# Patient Record
Sex: Female | Born: 1970 | Race: Black or African American | Hispanic: No | Marital: Married | Smoking: Never smoker
Health system: Southern US, Community
[De-identification: ages and names within clinical notes are randomized; demographics above are authoritative.]

## PROBLEM LIST (undated history)

## (undated) DIAGNOSIS — M549 Dorsalgia, unspecified: Secondary | ICD-10-CM

## (undated) DIAGNOSIS — K589 Irritable bowel syndrome without diarrhea: Secondary | ICD-10-CM

## (undated) DIAGNOSIS — F32A Depression, unspecified: Secondary | ICD-10-CM

## (undated) DIAGNOSIS — N809 Endometriosis, unspecified: Secondary | ICD-10-CM

## (undated) DIAGNOSIS — R5383 Other fatigue: Secondary | ICD-10-CM

## (undated) DIAGNOSIS — I10 Essential (primary) hypertension: Secondary | ICD-10-CM

## (undated) DIAGNOSIS — R7303 Prediabetes: Secondary | ICD-10-CM

## (undated) DIAGNOSIS — M722 Plantar fascial fibromatosis: Secondary | ICD-10-CM

## (undated) DIAGNOSIS — N3010 Interstitial cystitis (chronic) without hematuria: Secondary | ICD-10-CM

## (undated) DIAGNOSIS — F418 Other specified anxiety disorders: Secondary | ICD-10-CM

## (undated) DIAGNOSIS — R7309 Other abnormal glucose: Secondary | ICD-10-CM

## (undated) DIAGNOSIS — G43909 Migraine, unspecified, not intractable, without status migrainosus: Secondary | ICD-10-CM

## (undated) DIAGNOSIS — F329 Major depressive disorder, single episode, unspecified: Secondary | ICD-10-CM

## (undated) DIAGNOSIS — T7840XA Allergy, unspecified, initial encounter: Secondary | ICD-10-CM

## (undated) HISTORY — PX: OTHER SURGICAL HISTORY: SHX169

## (undated) HISTORY — DX: Major depressive disorder, single episode, unspecified: F32.9

## (undated) HISTORY — PX: TUBAL LIGATION: SHX77

## (undated) HISTORY — DX: Migraine, unspecified, not intractable, without status migrainosus: G43.909

## (undated) HISTORY — DX: Other specified anxiety disorders: F41.8

## (undated) HISTORY — PX: GANGLION CYST EXCISION: SHX1691

## (undated) HISTORY — PX: ABDOMINAL HYSTERECTOMY: SHX81

## (undated) HISTORY — DX: Interstitial cystitis (chronic) without hematuria: N30.10

## (undated) HISTORY — DX: Prediabetes: R73.03

## (undated) HISTORY — DX: Other abnormal glucose: R73.09

## (undated) HISTORY — DX: Other fatigue: R53.83

## (undated) HISTORY — DX: Plantar fascial fibromatosis: M72.2

## (undated) HISTORY — DX: Depression, unspecified: F32.A

## (undated) HISTORY — DX: Allergy, unspecified, initial encounter: T78.40XA

## (undated) HISTORY — DX: Irritable bowel syndrome, unspecified: K58.9

## (undated) HISTORY — PX: FOOT SURGERY: SHX648

## (undated) HISTORY — DX: Endometriosis, unspecified: N80.9

---

## 2001-07-15 ENCOUNTER — Emergency Department (HOSPITAL_COMMUNITY): Admission: EM | Admit: 2001-07-15 | Discharge: 2001-07-15 | Payer: Self-pay | Admitting: Emergency Medicine

## 2001-07-15 ENCOUNTER — Encounter: Payer: Self-pay | Admitting: Emergency Medicine

## 2002-12-21 ENCOUNTER — Emergency Department (HOSPITAL_COMMUNITY): Admission: EM | Admit: 2002-12-21 | Discharge: 2002-12-21 | Payer: Self-pay | Admitting: Emergency Medicine

## 2004-10-17 ENCOUNTER — Ambulatory Visit: Payer: Self-pay

## 2004-11-24 ENCOUNTER — Ambulatory Visit: Payer: Self-pay | Admitting: Pain Medicine

## 2004-12-29 ENCOUNTER — Ambulatory Visit: Payer: Self-pay | Admitting: Pain Medicine

## 2007-04-04 ENCOUNTER — Ambulatory Visit: Payer: Self-pay | Admitting: Urology

## 2007-04-10 ENCOUNTER — Ambulatory Visit: Payer: Self-pay | Admitting: Urology

## 2008-06-06 ENCOUNTER — Other Ambulatory Visit: Payer: Self-pay

## 2008-06-06 ENCOUNTER — Emergency Department: Payer: Self-pay | Admitting: Emergency Medicine

## 2012-04-16 ENCOUNTER — Ambulatory Visit: Payer: Self-pay | Admitting: Obstetrics and Gynecology

## 2012-04-16 LAB — CBC
HCT: 38.2 % (ref 35.0–47.0)
HGB: 13.3 g/dL (ref 12.0–16.0)
MCH: 33.1 pg (ref 26.0–34.0)
Platelet: 263 10*3/uL (ref 150–440)
RBC: 4.03 10*6/uL (ref 3.80–5.20)
RDW: 13.1 % (ref 11.5–14.5)

## 2012-04-16 LAB — PREGNANCY, URINE: Pregnancy Test, Urine: NEGATIVE m[IU]/mL

## 2012-04-22 ENCOUNTER — Ambulatory Visit: Payer: Self-pay | Admitting: Obstetrics and Gynecology

## 2012-09-14 ENCOUNTER — Ambulatory Visit: Payer: Self-pay | Admitting: Physician Assistant

## 2012-12-08 ENCOUNTER — Emergency Department: Payer: Self-pay | Admitting: Emergency Medicine

## 2012-12-08 LAB — COMPREHENSIVE METABOLIC PANEL
Albumin: 3.7 g/dL (ref 3.4–5.0)
Anion Gap: 4 — ABNORMAL LOW (ref 7–16)
BUN: 11 mg/dL (ref 7–18)
Bilirubin,Total: 0.2 mg/dL (ref 0.2–1.0)
Chloride: 107 mmol/L (ref 98–107)
Co2: 27 mmol/L (ref 21–32)
EGFR (African American): >60
Osmolality: 275 (ref 275–301)
SGOT(AST): 33 U/L (ref 15–37)
SGPT (ALT): 24 U/L (ref 12–78)

## 2012-12-08 LAB — CBC
HCT: 39.4 % (ref 35.0–47.0)
HGB: 13.1 g/dL (ref 12.0–16.0)
MCHC: 33.3 g/dL (ref 32.0–36.0)
MCV: 95 fL (ref 80–100)
Platelet: 292 10*3/uL (ref 150–440)
RBC: 4.13 10*6/uL (ref 3.80–5.20)
RDW: 13 % (ref 11.5–14.5)

## 2012-12-09 ENCOUNTER — Telehealth: Payer: Self-pay | Admitting: *Deleted

## 2012-12-09 MED ORDER — PREDNISONE 10 MG PO TABS: ORAL_TABLET | ORAL | Status: DC

## 2012-12-09 MED ORDER — TOPIRAMATE 100 MG PO TABS: 100.0000 mg | ORAL_TABLET | Freq: Every day | ORAL | Status: DC

## 2012-12-09 NOTE — Telephone Encounter (Signed)
Pt called again and I spoke to her and relayed the orders below re: oral prednisone 6 day taper (common SE increased appetite, energy, and insomnia).  Also to increase topamax after done with prednisone. Meds [facility] to [facility] (this is current pharmacy).  Level 8, L side ear, neck then back of neck.  On and Off since last Thursday.  ER from 6pm yesterday to 0200 this am.  @ doses of toradol and some anti emetic.  (level 9 down to 2) form hospital visit.   Post hysterectomy , started with night sweats and ? Reason why migraines.

## 2012-12-09 NOTE — Telephone Encounter (Signed)
Please ask patient to take 6 day dose pack of Prednisone. Take as directed. [facility] [place] Please verify that this is correct pharmacy. After that increase Topamax to 100mg  total dose daily. This will be changed to the 100mg  tab.

## 2012-12-09 NOTE — Telephone Encounter (Signed)
Spoke to patient and the pharmacy is correct. She will go pick medicine up today.

## 2012-12-09 NOTE — Telephone Encounter (Signed)
The patient is calling due to her having a migraine since the 73 th of March. She had a migraine yesterday and ended up going to the hospital in [facility].

## 2012-12-12 ENCOUNTER — Other Ambulatory Visit: Payer: Self-pay | Admitting: *Deleted

## 2012-12-12 MED ORDER — VERAPAMIL HCL 80 MG PO TABS: 80.0000 mg | ORAL_TABLET | Freq: Two times a day (BID) | ORAL | Status: DC

## 2012-12-12 NOTE — Telephone Encounter (Signed)
Called patient to let her know that her prescription was called in.

## 2012-12-12 NOTE — Telephone Encounter (Deleted)
Patient calling wanting to know if she can get a refill for her Verapamil HCL 80 mg BID

## 2012-12-12 NOTE — Telephone Encounter (Signed)
Refill sent.

## 2012-12-12 NOTE — Telephone Encounter (Signed)
Patient calling wanting a refill on Verapmil HCL 80 mg.

## 2013-04-21 ENCOUNTER — Telehealth: Payer: Self-pay | Admitting: Nurse Practitioner

## 2013-04-21 MED ORDER — TOPIRAMATE 100 MG PO TABS: 100.0000 mg | ORAL_TABLET | Freq: Every day | ORAL | Status: DC

## 2013-04-21 NOTE — Telephone Encounter (Signed)
Rx sent 

## 2013-04-24 DIAGNOSIS — R102 Pelvic and perineal pain: Secondary | ICD-10-CM | POA: Insufficient documentation

## 2013-04-24 DIAGNOSIS — R339 Retention of urine, unspecified: Secondary | ICD-10-CM | POA: Insufficient documentation

## 2013-04-24 DIAGNOSIS — N3010 Interstitial cystitis (chronic) without hematuria: Secondary | ICD-10-CM | POA: Insufficient documentation

## 2013-04-24 DIAGNOSIS — R350 Frequency of micturition: Secondary | ICD-10-CM | POA: Insufficient documentation

## 2013-04-24 DIAGNOSIS — N949 Unspecified condition associated with female genital organs and menstrual cycle: Secondary | ICD-10-CM | POA: Insufficient documentation

## 2013-04-24 DIAGNOSIS — G8929 Other chronic pain: Secondary | ICD-10-CM

## 2013-05-24 ENCOUNTER — Other Ambulatory Visit: Payer: Self-pay | Admitting: Neurology

## 2013-06-13 ENCOUNTER — Encounter: Payer: Self-pay | Admitting: Nurse Practitioner

## 2013-06-16 ENCOUNTER — Ambulatory Visit: Payer: Self-pay | Admitting: Nurse Practitioner

## 2013-06-21 ENCOUNTER — Other Ambulatory Visit: Payer: Self-pay | Admitting: Neurology

## 2013-07-18 ENCOUNTER — Other Ambulatory Visit: Payer: Self-pay

## 2013-07-18 MED ORDER — VERAPAMIL HCL 80 MG PO TABS: 80.0000 mg | ORAL_TABLET | Freq: Two times a day (BID) | ORAL | Status: DC

## 2013-07-27 ENCOUNTER — Other Ambulatory Visit: Payer: Self-pay | Admitting: Neurology

## 2013-07-28 ENCOUNTER — Telehealth: Payer: Self-pay | Admitting: Nurse Practitioner

## 2013-07-28 NOTE — Telephone Encounter (Signed)
Patient has a scheduled yearly appt, requesting a refill on topiramate-100 mg

## 2013-09-13 ENCOUNTER — Other Ambulatory Visit: Payer: Self-pay | Admitting: Nurse Practitioner

## 2013-10-16 ENCOUNTER — Ambulatory Visit: Payer: Self-pay | Admitting: Nurse Practitioner

## 2013-10-17 ENCOUNTER — Encounter: Payer: Self-pay | Admitting: Nurse Practitioner

## 2013-10-17 ENCOUNTER — Ambulatory Visit (INDEPENDENT_AMBULATORY_CARE_PROVIDER_SITE_OTHER): Payer: BC Managed Care – PPO | Admitting: Nurse Practitioner

## 2013-10-17 ENCOUNTER — Encounter (INDEPENDENT_AMBULATORY_CARE_PROVIDER_SITE_OTHER): Payer: Self-pay

## 2013-10-17 VITALS — BP 118/73 | HR 87 | Ht 66.5 in | Wt 198.0 lb

## 2013-10-17 DIAGNOSIS — G43009 Migraine without aura, not intractable, without status migrainosus: Secondary | ICD-10-CM

## 2013-10-17 DIAGNOSIS — R51 Headache: Secondary | ICD-10-CM

## 2013-10-17 DIAGNOSIS — R519 Headache, unspecified: Secondary | ICD-10-CM | POA: Insufficient documentation

## 2013-10-17 MED ORDER — FROVATRIPTAN SUCCINATE 2.5 MG PO TABS: 2.5000 mg | ORAL_TABLET | ORAL | Status: DC | PRN

## 2013-10-17 MED ORDER — TOPIRAMATE 100 MG PO TABS: 100.0000 mg | ORAL_TABLET | Freq: Every day | ORAL | Status: DC

## 2013-10-17 MED ORDER — VERAPAMIL HCL 80 MG PO TABS: 80.0000 mg | ORAL_TABLET | Freq: Two times a day (BID) | ORAL | Status: DC

## 2013-10-17 NOTE — Patient Instructions (Signed)
Continue Topamax, Verapamil and Frova, will refill F/U yearly and prn

## 2013-10-17 NOTE — Progress Notes (Signed)
[facility]  PATIENT: Brittney Johns DOB: 02/25/1971   REASON FOR VISIT: follow up for migraine    HISTORY OF PRESENT ILLNESS: Brittney Johns, 43 year old female returns for followup. She was last seen in this office 2/11/ 2013. Her migraines are in good control with a combination of verapamil and Topamax. She takes Frova acutely. She needs refills. She returns for reevaluation   HISTORY: of migraines. She is a previous pt of Dr. Thad Rangereynolds. She is currently on verapamil and Topamax tolerating medications without side effects. She relates that she has had more tension headaches recently, her mother has dementia and recently diagnosed with poor kidney function and poor circulation. 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 She gets very little help from her siblings.  She says that she is aware that her headaches are worse due to her additional stress and lack of sleep.   REVIEW OF SYSTEMS: Full 14 system review of systems performed and notable only for those listed, all others are neg:  Constitutional: N/A  Cardiovascular: N/A  Ear/Nose/Throat: N/A  Skin: N/A  Eyes: N/A  Respiratory: N/A  Gastroitestinal: N/A  Hematology/Lymphatic: N/A  Endocrine: N/A Musculoskeletal:N/A  Allergy/Immunology: N/A  Neurological: depression Psychiatric: N/A   ALLERGIES: Allergies  Allergen Reactions  . Imitrex [Sumatriptan]     HOME MEDICATIONS: Outpatient Prescriptions Prior to Visit  Medication Sig Dispense Refill  . citalopram (CELEXA) 20 MG tablet Take 40 mg by mouth daily.       . frovatriptan (FROVA) 2.5 MG tablet Take 2.5 mg by mouth as needed for migraine. If recurs, may repeat after 2 hours. Max of 3 tabs in 24 hours.      . IMIPRAMINE HCL PO Take by mouth. Dose unknown      . topiramate (TOPAMAX) 100 MG tablet TAKE 1 TABLET BY MOUTH EVERY DAY  30 tablet  1  . verapamil (CALAN) 80 MG tablet TAKE 1 TABLET (80 MG TOTAL) BY MOUTH 2 (TWO)  TIMES DAILY.  60 tablet  1  . Diclofenac Potassium (CAMBIA) 50 MG PACK Take 50 mg by mouth as needed.      . predniSONE (DELTASONE) 10 MG tablet 6 day dose pack  Take as directed  21 tablet  0   No facility-administered medications prior to visit.    PAST MEDICAL HISTORY: Past Medical History  Diagnosis Date  . Migraine   . Depression with anxiety     PAST SURGICAL HISTORY: Past Surgical History  Procedure Laterality Date  . Facial surgey      open bite wound  . Ganglion cyst excision    Hysterectomy  FAMILY HISTORY: Family History  Problem Relation Age of Onset  . Aneurysm Father   . Dementia Mother   . Renal Disease      SOCIAL HISTORY: History   Social History  . Marital Status: Single    Spouse Name: N/A    Number of Children: 1  . Years of Education: N/A   Occupational History  . Not on file.   Social History Main Topics  . Smoking status: Never Smoker   . Smokeless tobacco: Never Used  . Alcohol Use: No  . Drug Use: No  . Sexual Activity: Not on file   Other Topics Concern  . Not on file   Social History Narrative   Patient lives with her mother and son.   Patient works for [employer].  PHYSICAL EXAM  Filed Vitals:   10/17/13 0844  Height: 5' 6.5" (1.689 m)  Weight: 198 lb (89.812 kg)   Body mass index is 31.48 kg/(m^2).  Generalized: Well developed, in no acute distress   Neurological examination   Mentation: Alert oriented to time, place, history taking. Follows all commands speech and language fluent  Cranial nerve II-XII: Pupils were equal round reactive to light extraocular movements were full, visual field were full on confrontational test. Facial sensation and strength were normal. hearing was intact to finger rubbing bilaterally. Uvula tongue midline. head turning and shoulder shrug were normal and symmetric.Tongue protrusion into cheek strength was normal. Motor: normal bulk and tone, full  strength in the BUE, BLE,  No focal weakness Coordination: finger-nose-finger, heel-to-shin bilaterally, no dysmetria Reflexes: 1+ upper lower and symmetric  Gait and Station: Rising up from seated position without assistance, normal stance,  moderate stride, good arm swing, smooth turning, able to perform tiptoe, and heel walking without difficulty. Tandem gait is steady  DIAGNOSTIC DATA (LABS, IMAGING, TESTING) -    ASSESSMENT AND PLAN  43 y.o. year old female  has a past medical history of Migraine and Depression with anxiety. here for followup for migraine. She is currently on Verapamil and Topamax and  Frova  works acutely  Continue Topamax, Verapamil and Frova, will refill F/U yearly and prn Nilda Riggs, [facility], [facility], APRN  [facility] [address], [address] [place], [place] [address] [phone]

## 2014-02-16 HISTORY — PX: BILATERAL OOPHORECTOMY: SHX1221

## 2014-03-11 ENCOUNTER — Ambulatory Visit: Payer: Self-pay | Admitting: Obstetrics and Gynecology

## 2014-03-11 LAB — CBC
HCT: 40.1 % (ref 35.0–47.0)
HGB: 13.3 g/dL (ref 12.0–16.0)
MCH: 31.7 pg (ref 26.0–34.0)
MCHC: 33.3 g/dL (ref 32.0–36.0)
MCV: 95 fL (ref 80–100)
PLATELETS: 321 10*3/uL (ref 150–440)
RBC: 4.21 10*6/uL (ref 3.80–5.20)
RDW: 13.1 % (ref 11.5–14.5)
WBC: 5 10*3/uL (ref 3.6–11.0)

## 2014-03-16 ENCOUNTER — Ambulatory Visit: Payer: Self-pay | Admitting: Obstetrics and Gynecology

## 2014-03-17 LAB — PATHOLOGY REPORT

## 2014-10-15 ENCOUNTER — Encounter: Payer: Self-pay | Admitting: Nurse Practitioner

## 2014-10-15 ENCOUNTER — Ambulatory Visit (INDEPENDENT_AMBULATORY_CARE_PROVIDER_SITE_OTHER): Payer: BC Managed Care – PPO | Admitting: Nurse Practitioner

## 2014-10-15 VITALS — BP 118/77 | HR 87 | Ht 66.5 in | Wt 198.0 lb

## 2014-10-15 DIAGNOSIS — G43009 Migraine without aura, not intractable, without status migrainosus: Secondary | ICD-10-CM

## 2014-10-15 MED ORDER — VERAPAMIL HCL 80 MG PO TABS: 80.0000 mg | ORAL_TABLET | Freq: Two times a day (BID) | ORAL | Status: DC

## 2014-10-15 MED ORDER — TOPIRAMATE 100 MG PO TABS: 100.0000 mg | ORAL_TABLET | Freq: Every day | ORAL | Status: DC

## 2014-10-15 NOTE — Progress Notes (Signed)
I have reviewed and agreed above plan. 

## 2014-10-15 NOTE — Progress Notes (Signed)
[facility]  PATIENT: Brittney Johns DOB: 10/14/1970   REASON FOR VISIT: follow up for migraine HISTORY FROM:patient    HISTORY OF PRESENT ILLNESS:Brittney Johns, 44 year old female returns for followup. She was last seen in this office 10/17/13.  Her migraines are in good control with a combination of verapamil and Topamax. Her insurance does not cover  Frova. She is allergic to Imitrex. She has had more tension headaches recently due to stress.  She needs refills. She returns for reevaluation   HISTORY: of migraines. She is a previous pt of Dr. Thad Ranger. She is currently on verapamil and Topamax tolerating medications without side effects. She relates that she has had more tension headaches recently, her mother has dementia and recently diagnosed with poor kidney function and poor circulation. 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 She gets very little help from her siblings. She says that she is aware that her headaches are worse due to her additional stress and lack of sleep.   REVIEW OF SYSTEMS: Full 14 system review of systems performed and notable only for those listed, all others are neg:  Constitutional: neg  Cardiovascular: neg Ear/Nose/Throat: neg  Skin: neg Eyes: neg Respiratory: neg Gastroitestinal: urinary frequency Hematology/Lymphatic: neg  Endocrine: neg Musculoskeletal:neg Allergy/Immunology: neg Neurological: headache Psychiatric: depression Sleep : neg   ALLERGIES: Allergies  Allergen Reactions  . Imitrex [Sumatriptan]     HOME MEDICATIONS: Outpatient Prescriptions Prior to Visit  Medication Sig Dispense Refill  . citalopram (CELEXA) 20 MG tablet Take 40 mg by mouth daily.     . cyclobenzaprine (FLEXERIL) 5 MG tablet 5 mg at bedtime. As needed    . estradiol (ESTRACE) 1 MG tablet 1 mg daily. 1 1/5 tab daily    . fluconazole (DIFLUCAN) 150 MG tablet     . fluticasone (FLONASE) 50 MCG/ACT nasal  spray     . frovatriptan (FROVA) 2.5 MG tablet Take 1 tablet (2.5 mg total) by mouth as needed for migraine. If recurs, may repeat after 2 hours. Max of 3 tabs in 24 hours. 10 tablet 6  . IMIPRAMINE HCL PO Take by mouth. Dose unknown    . topiramate (TOPAMAX) 100 MG tablet Take 1 tablet (100 mg total) by mouth daily. 90 tablet 3  . verapamil (CALAN) 80 MG tablet Take 1 tablet (80 mg total) by mouth 2 (two) times daily. 60 tablet 11  . metroNIDAZOLE (FLAGYL) 500 MG tablet      No facility-administered medications prior to visit.    PAST MEDICAL HISTORY: Past Medical History  Diagnosis Date  . Migraine   . Depression with anxiety TMJ       PAST SURGICAL HISTORY: Past Surgical History  Procedure Laterality Date  . Facial surgey      open bite wound  . Ganglion cyst excision      FAMILY HISTORY: Family History  Problem Relation Age of Onset  . Aneurysm Father   . Dementia Mother   . Renal Disease      SOCIAL HISTORY: History   Social History  . Marital Status: Single    Spouse Name: N/A    Number of Children: 1  . Years of Education: 12+   Occupational History  . Not on file.   Social History Main Topics  . Smoking status: Never Smoker   . Smokeless tobacco: Never Used  . Alcohol Use: No  . Drug Use: No  . Sexual Activity: Not on file  Other Topics Concern  . Not on file   Social History Narrative   Patient lives with her mother and son.   Patient works for [employer].           PHYSICAL EXAM  Filed Vitals:   10/15/14 1511  BP: 118/77  Pulse: 87  Height: 5' 6.5" (1.689 m)  Weight: 198 lb (89.812 kg)   Body mass index is 31.48 kg/(m^2). Generalized: Well developed, in no acute distress   Neurological examination   Mentation: Alert oriented to time, place, history taking. Follows all commands speech and language fluent  Cranial nerve II-XII: Pupils were equal round reactive to light extraocular movements were full,  visual field were full on confrontational test. Facial sensation and strength were normal. hearing was intact to finger rubbing bilaterally. Uvula tongue midline. head turning and shoulder shrug were normal and symmetric.Tongue protrusion into cheek strength was normal. Motor: normal bulk and tone, full strength in the BUE, BLE, No focal weakness Coordination: finger-nose-finger, heel-to-shin bilaterally, no dysmetria Reflexes: 1+ upper lower and symmetric  Gait and Station: Rising up from seated position without assistance, normal stance, moderate stride, good arm swing, smooth turning, able to perform tiptoe, and heel walking without difficulty. Tandem gait is steady   DIAGNOSTIC DATA (LABS, IMAGING, TESTING) -  ASSESSMENT AND PLAN  44 y.o. year old female  has a past medical history of Migraine and Depression with anxiety. here to follow up. Her migraines are in excellent control.  Patient will continue Topamax as ordered will refill Will continue Verapamil as ordered will refill F/U yearly and prn Nilda RiggsNancy Carolyn Deona Novitski, [facility]NP, [facility], APRN  [facility] [address], [address] [place], [place] [address] [phone]

## 2014-10-15 NOTE — Patient Instructions (Signed)
Patient will continue Topamax as ordered will refill Will continue Verapamil as ordered will refill F/U yearly and prn

## 2015-01-05 NOTE — H&P (Signed)
PATIENT NAME:  Brittney Johns, Brittney Johns MR#:  [id number] DATE OF BIRTH:  1971-03-02  DATE OF ADMISSION:  04/22/2012  PREOPERATIVE DIAGNOSIS: Symptomatic endometriosis.   HISTORY OF PRESENT ILLNESS: Brittney Johns is a 44 year old African American female, para 1-0-2-1, with a long history of symptomatic endometriosis manifested with severe dysmenorrhea and chronic perimenstrual low back pain, previously treated with danazol until side effects occurred, presents now for definitive surgery through transvaginal hysterectomy. The patient does also have some mild pelvic organ prolapse which will facilitate the procedure through the vagina. She had some mild incontinence symptoms. She does not have to wear pads.   PAST MEDICAL HISTORY:  1. Endometriosis.  2. Chronic pelvic pain.  3. Anxiety/depression.  4. Seasonal allergies.  5. Migraine headaches.  6. Unstable bladder.   PAST SURGICAL HISTORY:  1. Laparoscopy with adhesiolysis.  2. Facial reconstruction.  3. Dilation and curettage of the uterus times two.   PAST OB HISTORY: Para 1-0-2-1, spontaneous vaginal delivery times one, 7-pound 2-ounce baby boy; TAB times two through dilation and curettage.   FAMILY HISTORY: Negative for cancer of the colon, ovary, and breast. There is no history of diabetes mellitus or endometriosis. There is hypertension in mother and brother.   SOCIAL HISTORY: The patient does not smoke. She does not drink alcohol. She does not use drugs.       DRUG ALLERGIES: Imitrex.   MEDICATIONS: 1. Celexa 40 mg daily.  2. Provera 30 mg daily. 3. Topamax 50 mg. 4. Elmiron 100 mg. 5. Verapamil 40 mg.  REVIEW OF SYSTEMS: The patient denies recent illness. She denies history of coagulopathy. She denies significant reactive airway disease.   PHYSICAL EXAMINATION:  VITAL SIGNS: Height 67.6 inches, weight 186.4 pounds, body mass index 28.7, heart rate 86, blood pressure 114/78.   GENERAL: The patient is a pleasant,  well-appearing African American female in no acute distress.  She is alert and oriented.   OROPHARYNX: Clear.   NECK: Supple. There is no thyromegaly or adenopathy.   LUNGS: Clear.   HEART: Regular rate and rhythm without murmur.   ABDOMEN: Soft, nontender. No organomegaly. No pelvic masses.   PELVIC: External genitalia normal. BUS normal. Vagina has good estrogen effect. Cervix is parous without lesions. There is no significant cervical motion tenderness. Uterus is midplane and is of top normal size and is mobile and slightly tender. There is mild prolapse to the mid vagina. Adnexa are nontender and nonpalpable.   RECTAL: Exam is not done.   EXTREMITIES: Without clubbing, cyanosis, or edema.   SKIN: Without rash.   MUSCULOSKELETAL: Exam is normal.   IMPRESSION:  1. Symptomatic endometriosis with chronic pelvic pain, midline and low back pain.  2. Mild pelvic organ prolapse.   PLAN: Transvaginal hysterectomy. Date of surgery is 04/22/2012.   CONSENT NOTE: Patient is to undergo TVH on 04/22/2012. She is understanding of the planned procedures and is aware of and is accepting of all surgical risks, which include but are not limited to bleeding, infection, pelvic organ injury with need for repair, blood clot disorders, anesthesia risks, and death. The patient does understand and accept all risks. She is ready and willing to proceed with surgery as scheduled.     ____________________________ Prentice DockerMartin A. Nino Amano, MD mad:bjt D: 04/21/2012 15:59:24 ET T: 04/21/2012 16:42:05 ET JOB#: [id number]  cc: Daphine DeutscherMartin A. Wendy Mikles, MD, <Dictator> Prentice DockerMARTIN A Kiwana Deblasi MD ELECTRONICALLY SIGNED 04/22/2012 14:25

## 2015-01-05 NOTE — Op Note (Signed)
PATIENT NAME:  Brittney LessenFRENCH, [name] MR#:  [id number] DATE OF BIRTH:  Apr 21, 1971  DATE OF PROCEDURE:  04/22/2012  PREOPERATIVE DIAGNOSIS: Symptomatic endometriosis.   POSTOPERATIVE DIAGNOSIS: Symptomatic endometriosis.   OPERATIVE PROCEDURE: Transvaginal hysterectomy.   SURGEON: Prentice DockerMartin A. Iyonnah Ferrante, M.D.   FIRST ASSISTANT: None.   ANESTHESIA: General endotracheal.   INDICATIONS: The patient is a 44 year old African American female, para 1-0-2-1, with a long history of symptomatic endometriosis manifested through severe dysmenorrhea and chronic perimenstrual low backache, previously treated with danazol until recently which was stopped secondary to side effects, and now presents for definitive surgery.   Findings at surgery revealed a small uterus, grossly normal in appearance. The ovaries and tubes appeared normal as well.   DESCRIPTION OF PROCEDURE: The patient was brought to the Operating Room where she was placed in the supine position. General endotracheal anesthesia was induced without difficulty. She was placed in the dorsal lithotomy position using the candy-cane stirrups. A Betadine perineal and intravaginal prep and drape was performed in the standard fashion. A Foley catheter was placed into the bladder and was draining clear yellow urine. A weighted speculum was placed into the vagina and a double-tooth tenaculum was placed on the cervix. A posterior colpotomy was made with Mayo scissors. The uterosacral ligaments were clamped, cut, and stick tied using 0 Vicryl suture. These were tagged. The cervix was circumscribed with a scalpel and the bladder was dissected off the lower uterine segment through sharp and blunt dissection. Sequentially the cardinal broad ligament complexes were then clamped, cut, and stick tied using 0 Vicryl suture. This was carried out to the level of the utero-ovarian ligaments which were then crossclamped, cut, and doubly ligated. The uterus was removed from the  operative field. The uteroovarian ligaments had two ligatures. First was a free tie with the second being a stick tie. Evaluation of the adnexal structures revealed normal tubes and ovaries. Minimal oozing was identified along the cardinal broad ligament suture line and these were oversewn with 3-0 Vicryl suture, in a simple running manner. Good hemostasis was obtained. The peritoneum was then reapproximated using a pursestring stitch of 0 Vicryl. This was followed by closure of the vagina with simple interrupted sutures of 2-0 chromic. On completion of the procedure, all instrumentation was removed from the vagina. The patient was then awakened, mobilized, and taken to the recovery room in satisfactory condition. Estimated blood loss was 50 mL. IV fluids was 700 mL. Urine output was 150 mL. the patient did receive Ancef antibiotic prophylaxis prior to surgery.  ____________________________ Prentice DockerMartin A. Aryn Kops, MD mad:slb D: 04/22/2012 14:25:10 ET T: 04/22/2012 14:55:00 ET JOB#: [id number]  cc: Daphine DeutscherMartin A. Jaicey Sweaney, MD, <Dictator> Prentice DockerMARTIN A Fraida Veldman MD ELECTRONICALLY SIGNED 04/24/2012 9:28

## 2015-01-09 NOTE — Op Note (Signed)
PATIENT NAME:  Brittney Johns, Brittney Johns MR#:  [id number] DATE OF BIRTH:  1971/03/11  DATE OF PROCEDURE:  03/16/2014  PREOPERATIVE DIAGNOSIS: Symptomatic endometriosis.   POSTOPERATIVE DIAGNOSES:  1.  Symptomatic endometriosis.  2.  Pelvic adhesive disease.  3.  Patent ureters.  OPERATIVE PROCEDURES:  1.  Laparoscopic bilateral salpingo-oophorectomy. 2.  Adhesiolysis.  3.  Cystoscopy.  SURGEON: Prentice Docker. Calloway Andrus, M.D.   FIRST ASSISTANT: Dr. Valentino Saxon.   SECOND ASSISTANT: Jimmey Ralph, PA-S.   ANESTHESIA: General endotracheal.   INDICATIONS: The patient is a 44 year old African American female, status post transvaginal hysterectomy in the past, who has history of endometriosis, presents now for definitive surgery regarding chronic pelvic pain. The patient has significant pain requiring narcotic therapy and she now wishes to proceed with definitive surgery through laparoscopic BSO.  FINDINGS AT SURGERY: Revealed pelvic adhesions between the bowel and the right adnexa which were lysed. There were vaginal cuff adhesions which were lysed. The left tube and ovary appeared grossly normal. The right ovary was densely adherent to the pelvic sidewall overlying the ureter. There was diffuse yellow lesions of endometriosis noted over the anterior abdominal wall and bladder flap peritoneum. No powder burn implants were seen. The appendix appeared grossly normal. The upper abdomen, including liver and diaphragm, appeared normal.   DESCRIPTION OF PROCEDURE: The patient was brought to the operating room where she was placed in the supine position. General anesthesia was induced without difficulty. She was placed in the low lithotomy position using the bumblebee stirrups. A ChloraPrep and Betadine abdominal, perineal, and intravaginal prep and drape was performed in standard fashion. A red Robinson catheter was placed and was draining clear yellow urine from the bladder. A sponge stick was placed into the vagina. A  subumbilical vertical incision 5 mm in length was made. The Optiview laparoscopic trocar system was used to place the laparoscopic port through the subumbilical incision under direct visualization. No bowel or vascular injury was encountered. Two other ports were placed in the right and left lower quadrants, respectively. The right lower quadrant port was an 11 mm port. The left lower quadrant port was a 5 mm port. The above-noted findings were photo documented. The adhesiolysis was then performed using the Ace Harmonic scalpel and sharp and blunt dissection with alligator forceps. Kleppinger bipolar forceps were also used during the procedure to provide hemostasis at the infundibulopelvic ligament pedicles. The left tube and ovary was grasped and then the Harmonic scalpel was used to clamp, coagulate and desiccate the infundibulopelvic ligament, medial salpinx, and adhesions attaching it to the vaginal cuff. This uterus, tube, and ovary was placed in the cul-de-sac for further retrieval. Kleppinger bipolar forceps were used on the infundibulopelvic ligament pedicle to optimize hemostasis. Next, the adhesions noted in the cul-de-sac were taken down along with the bowel adhesions which were attached to the right adnexal region. Once anatomy was somewhat restored to normal, the right tube and ovary were excised using the Ace Harmonic scalpel. Care was taken to avoid the ureter during this dissection. Some 30% of residual ovary was left intact along with pelvic peritoneal sidewall, which was overlying the ureter. Once the tube and ovary were excised additional debulking of residual ovary was performed bluntly with grasping forceps along the pelvic sidewall. Some minimal Kleppinger bipolar forceps electrocautery was used to optimize hemostasis. Copious irrigation of the pelvis was performed. Bleeding was well controlled. Because the right ureter ran just underneath the right pelvic sidewall ovarian tissue, decision was  made to perform  cystoscopy to be sure that there was integrity of the ureters and functionality of the ureters. Prior to performing cystoscopy the Endo Catch bag was used to retrieve the bilateral tubes and ovaries. These were taken out through the right lower quadrant port site. Once the specimens were removed, hemostasis was verified, the laparoscopic procedure was terminated with the instruments being removed from the abdominopelvic cavity. The incisions were closed with 0 Vicryl on the fascia and 4-0 Vicryl on the skin. Dermabond glue was placed over the skin incisions. All instruments, needle, and sponge counts were verified as correct.   ESTIMATED BLOOD LOSS: Minimal. Urine output was 150 mL.   IV FLUIDS: Not quantified at the time of dictation.   ADDENDUM NOTE: Once the laparoscopic BSO and adhesiolysis was accomplished, cystoscopy was performed to be sure that there was functionality of the ureters. Then 5 mL of methylene blue was given intravenously and cystoscopy was then performed with the 30 degree scope. Visualization of the bladder was notable for normal appearing mucosa and patency and functionality of the ureteral orifices bilaterally. Once verification that there was appropriate functioning, cystoscopy was terminated with all instrumentation being removed from the bladder and vagina. The patient was then awakened, extubated and taken to the recovery room in satisfactory condition.   COMPLICATIONS: None.   COUNTS: All instruments, needle, and sponge counts were verified as correct.   ____________________________ Prentice DockerMartin A. Chun Sellen, MD mad:sb D: 03/18/2014 16:54:18 ET T: 03/19/2014 07:42:19 ET JOB#: [id number]  cc: Daphine DeutscherMartin A. Ashtynn Berke, MD, <Dictator> Prentice DockerMARTIN A Jeremie Abdelaziz MD ELECTRONICALLY SIGNED 03/21/2014 3:57

## 2015-02-10 ENCOUNTER — Emergency Department (HOSPITAL_COMMUNITY)
Admission: EM | Admit: 2015-02-10 | Discharge: 2015-02-10 | Disposition: A | Discharge status: Home / Self Care | Payer: BC Managed Care – PPO | Attending: Emergency Medicine | Admitting: Emergency Medicine

## 2015-02-10 ENCOUNTER — Encounter (HOSPITAL_COMMUNITY): Payer: Self-pay | Admitting: *Deleted

## 2015-02-10 DIAGNOSIS — M545 Low back pain, unspecified: Secondary | ICD-10-CM

## 2015-02-10 DIAGNOSIS — Z7982 Long term (current) use of aspirin: Secondary | ICD-10-CM | POA: Diagnosis not present

## 2015-02-10 DIAGNOSIS — F329 Major depressive disorder, single episode, unspecified: Secondary | ICD-10-CM | POA: Insufficient documentation

## 2015-02-10 DIAGNOSIS — Z79899 Other long term (current) drug therapy: Secondary | ICD-10-CM | POA: Insufficient documentation

## 2015-02-10 DIAGNOSIS — G43909 Migraine, unspecified, not intractable, without status migrainosus: Secondary | ICD-10-CM | POA: Insufficient documentation

## 2015-02-10 DIAGNOSIS — F419 Anxiety disorder, unspecified: Secondary | ICD-10-CM | POA: Diagnosis not present

## 2015-02-10 LAB — URINALYSIS, ROUTINE W REFLEX MICROSCOPIC
Bilirubin Urine: NEGATIVE
Glucose, UA: NEGATIVE mg/dL
HGB URINE DIPSTICK: NEGATIVE
KETONES UR: NEGATIVE mg/dL
LEUKOCYTES UA: NEGATIVE
Nitrite: NEGATIVE
Protein, ur: NEGATIVE mg/dL
Specific Gravity, Urine: 1.016 (ref 1.005–1.030)
Urobilinogen, UA: 1 mg/dL (ref 0.0–1.0)
pH: 6 (ref 5.0–8.0)

## 2015-02-10 MED ORDER — OXYCODONE-ACETAMINOPHEN 5-325 MG PO TABS: 1.0000 | ORAL_TABLET | Freq: Four times a day (QID) | ORAL | Status: DC | PRN

## 2015-02-10 MED ORDER — IBUPROFEN 600 MG PO TABS: 600.0000 mg | ORAL_TABLET | Freq: Four times a day (QID) | ORAL | Status: DC | PRN

## 2015-02-10 MED ORDER — OXYCODONE-ACETAMINOPHEN 5-325 MG PO TABS
1.0000 | ORAL_TABLET | Freq: Once | ORAL | Status: AC
  Administered 2015-02-10: 1 via ORAL
  Filled 2015-02-10: qty 1

## 2015-02-10 MED ORDER — KETOROLAC TROMETHAMINE 60 MG/2ML IM SOLN
60.0000 mg | Freq: Once | INTRAMUSCULAR | Status: AC
  Administered 2015-02-10: 60 mg via INTRAMUSCULAR

## 2015-02-10 NOTE — ED Provider Notes (Signed)
CSN: [id number]     Arrival date & time 02/10/15  0107 History  This chart was scribed for Shon Baton, MD by Doreatha Martin, ED Scribe. This patient was seen in room A06C/A06C and the patient's care was started at 1:34 AM.     Chief Complaint  Patient presents with  . Back Pain   The history is provided by the patient. No language interpreter was used.    HPI Comments: Brittney Johns is a 44 y.o. female with no chronic medical conditions who presents to the Emergency Department complaining of constant, left-sided 8/10 lower back pain onset 3 hours PTA.  She describes the pain with stiffness and tightness. Worse with movement.  She denies any falls, injuries, trauma, or heavy lifting prior to onset. She reports associated mild dysuria, vaginal itching, and abnormally dark urine from baseline. Pt states that she took Ibuprofen PTA with mild relief. She also states that she tried heat application with no relief, and this resulted in nausea. She takes Topamax, Celexa, Estradiol, and Verapamil at home. She denies a Hx of kidney stones. Pt also denies fevers or chills as associated Sx.   Past Medical History  Diagnosis Date  . Migraine   . Depression with anxiety    Past Surgical History  Procedure Laterality Date  . Facial surgey      open bite wound  . Ganglion cyst excision     Family History  Problem Relation Age of Onset  . Aneurysm Father   . Dementia Mother   . Renal Disease     History  Substance Use Topics  . Smoking status: Never Smoker   . Smokeless tobacco: Never Used  . Alcohol Use: No   OB History    No data available     Review of Systems  Constitutional: Negative for fever.  Genitourinary: Positive for dysuria. Negative for difficulty urinating.  Musculoskeletal: Positive for back pain.  Neurological: Negative for weakness and numbness.  All other systems reviewed and are negative.     Allergies  Imitrex  Home Medications   Prior to Admission  medications   Medication Sig Start Date End Date Taking? Authorizing Provider  aspirin 81 MG chewable tablet Chew 81 mg by mouth daily.   Yes Historical Provider, MD  citalopram (CELEXA) 20 MG tablet Take 20 mg by mouth daily.    Yes Historical Provider, MD  estradiol (ESTRACE) 1 MG tablet Take 1.5 mg by mouth daily.  10/15/13  Yes Historical Provider, MD  imipramine (TOFRANIL) 25 MG tablet Take 25 mg by mouth at bedtime.   Yes Historical Provider, MD  verapamil (CALAN) 80 MG tablet Take 1 tablet (80 mg total) by mouth 2 (two) times daily. 10/15/14  Yes Nilda Riggs, NP  ibuprofen (ADVIL,MOTRIN) 600 MG tablet Take 1 tablet (600 mg total) by mouth every 6 (six) hours as needed. 02/10/15   Shon Baton, MD  oxyCODONE-acetaminophen (PERCOCET/ROXICET) 5-325 MG per tablet Take 1 tablet by mouth every 6 (six) hours as needed for severe pain. 02/10/15   Shon Baton, MD  topiramate (TOPAMAX) 100 MG tablet Take 1 tablet (100 mg total) by mouth daily. Patient not taking: Reported on 02/10/2015 10/15/14   Nilda Riggs, NP   BP 120/75 mmHg  Pulse 92  Temp(Src) 97.9 F (36.6 C) (Oral)  Resp 16  SpO2 96% Physical Exam  Constitutional: She is oriented to person, place, and time. She appears well-developed and well-nourished.  HENT:  Head:  Normocephalic and atraumatic.  Cardiovascular: Normal rate, regular rhythm and normal heart sounds.   Pulmonary/Chest: Effort normal and breath sounds normal. No respiratory distress. She has no wheezes.  Abdominal: Soft. There is no tenderness.  Musculoskeletal:  Tenderness to palpation over the left lumbar paraspinous muscle region  Neurological: She is alert and oriented to person, place, and time.  Skin: Skin is warm and dry.  Psychiatric: She has a normal mood and affect.  Nursing note and vitals reviewed.   ED Course  Procedures (including critical care time) DIAGNOSTIC STUDIES: Oxygen Saturation is 97% on RA, normal by my  interpretation.    COORDINATION OF CARE: 1:37 AM Discussed treatment plan with pt at bedside and pt agreed to plan.  Labs Review Labs Reviewed  URINALYSIS, ROUTINE W REFLEX MICROSCOPIC - Abnormal; Notable for the following:    APPearance HAZY (*)    All other components within normal limits    Imaging Review No results found.   EKG Interpretation None      MDM   Final diagnoses:  Left-sided low back pain without sciatica    Patient presents with left lower back pain. Nontoxic on exam. No signs or symptoms of cauda equina. Patient does have urinary symptoms. Considerations include urinary tract infection versus musculoskeletal sprain. Patient given Toradol and Percocet. Urinalysis without evidence of obvious infection. No hematuria suggestive of kidney stones. Patient reports improvement with treatment. Given that pain worsens with movement and patient is tender on exam, this most likely represents a musculoskeletal strain. Patient will be given a short course of pain medication. She is to take ibuprofen every 6 hours as well.  After history, exam, and medical workup I feel the patient has been appropriately medically screened and is safe for discharge home. Pertinent diagnoses were discussed with the patient. Patient was given return precautions.  I personally performed the services described in this documentation, which was scribed in my presence. The recorded information has been reviewed and is accurate.   Shon Batonourtney F Horton, MD 02/10/15 [phone]

## 2015-02-10 NOTE — ED Notes (Signed)
Pt in c/o lower back pain, body aches and nausea, history of back pain in the past but states this feels different

## 2015-02-10 NOTE — Discharge Instructions (Signed)
Back Pain, Adult Low back pain is very common. About 1 in 5 people have back pain.The cause of low back pain is rarely dangerous. The pain often gets better over time.About half of people with a sudden onset of back pain feel better in just 2 weeks. About 8 in 10 people feel better by 6 weeks.  CAUSES Some common causes of back pain include:  Strain of the muscles or ligaments supporting the spine.  Wear and tear (degeneration) of the spinal discs.  Arthritis.  Direct injury to the back. DIAGNOSIS Most of the time, the direct cause of low back pain is not known.However, back pain can be treated effectively even when the exact cause of the pain is unknown.Answering your caregiver's questions about your overall health and symptoms is one of the most accurate ways to make sure the cause of your pain is not dangerous. If your caregiver needs more information, he or she may order lab work or imaging tests (X-rays or MRIs).However, even if imaging tests show changes in your back, this usually does not require surgery. HOME CARE INSTRUCTIONS For many people, back pain returns.Since low back pain is rarely dangerous, it is often a condition that people can learn to manageon their own.   Remain active. It is stressful on the back to sit or stand in one place. Do not sit, drive, or stand in one place for more than 30 minutes at a time. Take short walks on level surfaces as soon as pain allows.Try to increase the length of time you walk each day.  Do not stay in bed.Resting more than 1 or 2 days can delay your recovery.  Do not avoid exercise or work.Your body is made to move.It is not dangerous to be active, even though your back may hurt.Your back will likely heal faster if you return to being active before your pain is gone.  Pay attention to your body when you bend and lift. Many people have less discomfortwhen lifting if they bend their knees, keep the load close to their bodies,and  avoid twisting. Often, the most comfortable positions are those that put less stress on your recovering back.  Find a comfortable position to sleep. Use a firm mattress and lie on your side with your knees slightly bent. If you lie on your back, put a pillow under your knees.  Only take over-the-counter or prescription medicines as directed by your caregiver. Over-the-counter medicines to reduce pain and inflammation are often the most helpful.Your caregiver may prescribe muscle relaxant drugs.These medicines help dull your pain so you can more quickly return to your normal activities and healthy exercise.  Put ice on the injured area.  Put ice in a plastic bag.  Place a towel between your skin and the bag.  Leave the ice on for 15-20 minutes, 03-04 times a day for the first 2 to 3 days. After that, ice and heat may be alternated to reduce pain and spasms.  Ask your caregiver about trying back exercises and gentle massage. This may be of some benefit.  Avoid feeling anxious or stressed.Stress increases muscle tension and can worsen back pain.It is important to recognize when you are anxious or stressed and learn ways to manage it.Exercise is a great option. SEEK MEDICAL CARE IF:  You have pain that is not relieved with rest or medicine.  You have pain that does not improve in 1 week.  You have new symptoms.  You are generally not feeling well. SEEK   IMMEDIATE MEDICAL CARE IF:   You have pain that radiates from your back into your legs.  You develop new bowel or bladder control problems.  You have unusual weakness or numbness in your arms or legs.  You develop nausea or vomiting.  You develop abdominal pain.  You feel faint. Document Released: 09/04/2005 Document Revised: 03/05/2012 Document Reviewed: 01/06/2014 Exit[employer] Patient Information 2015 [employer]. This information is not intended to replace advice given to you by your health care provider. Make sure you  discuss any questions you have with your health care provider.  

## 2015-02-20 ENCOUNTER — Emergency Department
Admission: EM | Admit: 2015-02-20 | Discharge: 2015-02-21 | Disposition: A | Discharge status: Home / Self Care | Payer: BC Managed Care – PPO | Attending: Emergency Medicine | Admitting: Emergency Medicine

## 2015-02-20 ENCOUNTER — Encounter: Payer: Self-pay | Admitting: *Deleted

## 2015-02-20 ENCOUNTER — Emergency Department: Payer: BC Managed Care – PPO

## 2015-02-20 DIAGNOSIS — Z7982 Long term (current) use of aspirin: Secondary | ICD-10-CM | POA: Diagnosis not present

## 2015-02-20 DIAGNOSIS — W010XXA Fall on same level from slipping, tripping and stumbling without subsequent striking against object, initial encounter: Secondary | ICD-10-CM | POA: Diagnosis not present

## 2015-02-20 DIAGNOSIS — Y9289 Other specified places as the place of occurrence of the external cause: Secondary | ICD-10-CM | POA: Diagnosis not present

## 2015-02-20 DIAGNOSIS — Z79899 Other long term (current) drug therapy: Secondary | ICD-10-CM | POA: Diagnosis not present

## 2015-02-20 DIAGNOSIS — Y9389 Activity, other specified: Secondary | ICD-10-CM | POA: Diagnosis not present

## 2015-02-20 DIAGNOSIS — Z793 Long term (current) use of hormonal contraceptives: Secondary | ICD-10-CM | POA: Diagnosis not present

## 2015-02-20 DIAGNOSIS — S40011A Contusion of right shoulder, initial encounter: Secondary | ICD-10-CM | POA: Insufficient documentation

## 2015-02-20 DIAGNOSIS — S8991XA Unspecified injury of right lower leg, initial encounter: Secondary | ICD-10-CM | POA: Insufficient documentation

## 2015-02-20 DIAGNOSIS — Y998 Other external cause status: Secondary | ICD-10-CM | POA: Insufficient documentation

## 2015-02-20 DIAGNOSIS — S4991XA Unspecified injury of right shoulder and upper arm, initial encounter: Secondary | ICD-10-CM | POA: Diagnosis present

## 2015-02-20 MED ORDER — HYDROMORPHONE HCL 1 MG/ML IJ SOLN
1.0000 mg | Freq: Once | INTRAMUSCULAR | Status: AC
  Administered 2015-02-21: 1 mg via INTRAMUSCULAR

## 2015-02-20 MED ORDER — KETOROLAC TROMETHAMINE 60 MG/2ML IM SOLN
60.0000 mg | Freq: Once | INTRAMUSCULAR | Status: AC
  Administered 2015-02-20: 60 mg via INTRAMUSCULAR

## 2015-02-20 MED ORDER — KETOROLAC TROMETHAMINE 60 MG/2ML IM SOLN
INTRAMUSCULAR | Status: AC
  Administered 2015-02-20: 60 mg via INTRAMUSCULAR
  Filled 2015-02-20: qty 2

## 2015-02-20 MED ORDER — HYDROMORPHONE HCL 1 MG/ML IJ SOLN
INTRAMUSCULAR | Status: AC
  Administered 2015-02-21: 1 mg via INTRAMUSCULAR
  Filled 2015-02-20: qty 1

## 2015-02-20 NOTE — ED Notes (Signed)
Pt tripped over open dishwasher door, fell onto R side and c/o injury to R upper arm. Pt also c/o lesser pain and injury to R knee and R shin but is ambulatory and steady to triage.

## 2015-02-20 NOTE — ED Provider Notes (Signed)
[facility] Emergency Department Provider Note ____________________________________________  Time seen: Approximately 11:31 PM  I have reviewed the triage vital signs and the nursing notes.   HISTORY  Chief Complaint Arm Injury    HPI Brittney Johns is a 44 y.o. female plana right upper extremity pain secondary to a trip and fall. He states she's gentle open dishwasher door fall on her right side. Patient stated there is bruising and pain to the right upper extremity and some pain to the right knee. Patient stated there is decreased range of motion of the right upper extremity secondary to complain of pain. Patient rates the pain as a 6/10. Has any loss of sensation. Patient states she took 800 mg of ibuprofen approximately 5 hours ago.   Past Medical History  Diagnosis Date  . Migraine   . Depression with anxiety     Patient Active Problem List   Diagnosis Date Noted  . Migraine without aura 10/17/2013  . Headache(784.0) 10/17/2013  . FOM (frequency of micturition) 04/24/2013  . Female genital symptoms 04/24/2013  . Incomplete bladder emptying 04/24/2013  . Chronic interstitial cystitis 04/24/2013    Past Surgical History  Procedure Laterality Date  . Facial surgey      open bite wound  . Ganglion cyst excision      Current Outpatient Rx  Name  Route  Sig  Dispense  Refill  . aspirin 81 MG chewable tablet   Oral   Chew 81 mg by mouth daily.         . citalopram (CELEXA) 20 MG tablet   Oral   Take 20 mg by mouth daily.          Marland Kitchen estradiol (ESTRACE) 1 MG tablet   Oral   Take 1.5 mg by mouth daily.          Marland Kitchen ibuprofen (ADVIL,MOTRIN) 600 MG tablet   Oral   Take 1 tablet (600 mg total) by mouth every 6 (six) hours as needed.   30 tablet   0   . imipramine (TOFRANIL) 25 MG tablet   Oral   Take 25 mg by mouth at bedtime.         Marland Kitchen oxyCODONE-acetaminophen (PERCOCET/ROXICET) 5-325 MG per tablet   Oral   Take 1 tablet by  mouth every 6 (six) hours as needed for severe pain.   10 tablet   0   . topiramate (TOPAMAX) 100 MG tablet   Oral   Take 1 tablet (100 mg total) by mouth daily. Patient not taking: Reported on 02/10/2015   90 tablet   3   . verapamil (CALAN) 80 MG tablet   Oral   Take 1 tablet (80 mg total) by mouth 2 (two) times daily.   60 tablet   11     Allergies Imitrex  Family History  Problem Relation Age of Onset  . Aneurysm Father   . Dementia Mother   . Renal Disease      Social History History  Substance Use Topics  . Smoking status: Never Smoker   . Smokeless tobacco: Never Used  . Alcohol Use: No    Review of Systems Constitutional: No fever/chills Eyes: No visual changes. ENT: No sore throat. Cardiovascular: Denies chest pain. Respiratory: Denies shortness of breath. Gastrointestinal: No abdominal pain.  No nausea, no vomiting.  No diarrhea.  No constipation. Genitourinary: Negative for dysuria. Musculoskeletal: Shoulder pain and right knee pain. Skin: Negative for rash. Neurological: Negative for headaches, focal  weakness or numbness. {10-point ROS otherwise negative.  ____________________________________________   PHYSICAL EXAM:  VITAL SIGNS: ED Triage Vitals  Enc Vitals Group     BP 02/20/15 2207 123/83 mmHg     Pulse Rate 02/20/15 2207 83     Resp 02/20/15 2207 20     Temp 02/20/15 2207 97.7 F (36.5 C)     Temp Source 02/20/15 2207 Oral     SpO2 02/20/15 2207 98 %     Weight 02/20/15 2207 198 lb (89.812 kg)     Height 02/20/15 2207 5\' 7"  (1.702 m)     Head Cir --      Peak Flow --      Pain Score 02/20/15 2208 6     Pain Loc --      Pain Edu? --      Excl. in GC? --     Constitutional: Alert and oriented. Distress Eyes: Conjunctivae are normal. PERRL. EOMI. Head: Atraumatic. Nose: No congestion/rhinnorhea. Mouth/Throat: Mucous membranes are moist.  Oropharynx non-erythematous. Neck: No stridor. Cardiovascular: Normal rate, regular  rhythm. Grossly normal heart sounds.  Good peripheral circulation. Respiratory: Normal respiratory effort.  No retractions. Lungs CTAB. Gastrointestinal: Soft and nontender. No distention. No abdominal bruits. No CVA tenderness. Musculoskeletal: No lower extremity tenderness nor edema.  No joint effusions. Neurologic:  Normal speech and language. No gross focal neurologic deficits are appreciated. Speech is normal. No gait instability. Skin:  Skin is warm, dry and intact. No rash noted. Psychiatric: Mood and affect are normal. Speech and behavior are normal.  ____________________________________________   LABS (all labs ordered are listed, but only abnormal results are displayed)  Labs Reviewed - No data to display ____________________________________________  EKG  ____________________________________________  RADIOLOGY  No acute findings on a right shoulder x-ray. ____________________________________________   PROCEDURES  Procedure(s) performed: None  Critical Care performed: No  ____________________________________________   INITIAL IMPRESSION / ASSESSMENT AND PLAN / ED COURSE  Pertinent labs & imaging results that were available during my care of the patient were reviewed by me and considered in my medical decision making (see chart for details).  Contusion to the right shoulder and right knee. ____________________________________________   FINAL CLINICAL IMPRESSION(S) / ED DIAGNOSES  Final diagnoses:  Shoulder contusion, right, initial encounter      Joni ReiningRonald K Jeremaih Klima, PA-C 02/20/15 2340  Phineas SemenGraydon Goodman, MD 02/20/15 [phone]

## 2015-02-20 NOTE — Discharge Instructions (Signed)
Wear sling for 3-5 days as needed.

## 2015-02-21 NOTE — ED Notes (Signed)
Patient with no complaints at this time. Respirations even and unlabored. Skin warm/dry. Discharge instructions reviewed with patient at this time. Patient given opportunity to voice concerns/ask questions. Patient discharged at this time and left Emergency Department with steady gait.   

## 2015-03-06 ENCOUNTER — Other Ambulatory Visit: Payer: Self-pay | Admitting: Nurse Practitioner

## 2015-04-02 ENCOUNTER — Other Ambulatory Visit: Payer: Self-pay | Admitting: Obstetrics and Gynecology

## 2015-04-02 DIAGNOSIS — K589 Irritable bowel syndrome without diarrhea: Secondary | ICD-10-CM | POA: Insufficient documentation

## 2015-04-02 DIAGNOSIS — N3010 Interstitial cystitis (chronic) without hematuria: Secondary | ICD-10-CM | POA: Insufficient documentation

## 2015-04-02 DIAGNOSIS — R5383 Other fatigue: Secondary | ICD-10-CM | POA: Insufficient documentation

## 2015-04-02 DIAGNOSIS — T7840XA Allergy, unspecified, initial encounter: Secondary | ICD-10-CM | POA: Insufficient documentation

## 2015-04-02 DIAGNOSIS — N809 Endometriosis, unspecified: Secondary | ICD-10-CM | POA: Insufficient documentation

## 2015-04-02 DIAGNOSIS — G43909 Migraine, unspecified, not intractable, without status migrainosus: Secondary | ICD-10-CM | POA: Insufficient documentation

## 2015-04-05 ENCOUNTER — Ambulatory Visit (INDEPENDENT_AMBULATORY_CARE_PROVIDER_SITE_OTHER): Payer: BC Managed Care – PPO | Admitting: Family Medicine

## 2015-04-05 ENCOUNTER — Encounter: Payer: Self-pay | Admitting: Family Medicine

## 2015-04-05 VITALS — BP 103/69 | HR 75 | Temp 97.4°F | Wt 197.0 lb

## 2015-04-05 DIAGNOSIS — G8929 Other chronic pain: Secondary | ICD-10-CM | POA: Diagnosis not present

## 2015-04-05 DIAGNOSIS — M25551 Pain in right hip: Secondary | ICD-10-CM

## 2015-04-05 MED ORDER — HYDROCODONE-ACETAMINOPHEN 5-325 MG PO TABS: 1.0000 | ORAL_TABLET | Freq: Four times a day (QID) | ORAL | Status: DC | PRN

## 2015-04-05 MED ORDER — PREDNISONE 10 MG PO TABS: ORAL_TABLET | ORAL | Status: DC

## 2015-04-05 NOTE — Patient Instructions (Signed)
Start the prednisone and take with food Do NOT take any NSAIDs like ibuprofen or naproxen Try Prilosec or Zantac for the next week or two to help protect stomach We'll refer to ortho Please do let me know if not better

## 2015-04-05 NOTE — Progress Notes (Signed)
BP 103/69 mmHg  Pulse 75  Temp(Src) 97.4 F (36.3 C)  Wt 197 lb (89.359 kg)  SpO2 97%   Subjective:    Patient ID: Brittney Johns, female    DOB: [birth year], 44 y.o.   MRN: [id number]  HPI: Brittney Johns is a 44 y.o. female  Chief Complaint  Patient presents with  . Hip Pain    right hip   She has been bothered with hip pain, several months ago; did physical therapy for weeks; it will just not go away; the therapist said it wasn't hip bursitis; he says it is in the muscle; the pain did not ever completely went away; the exercises Can't sleep at night; if she sits on firm surfaces, takes a while to walk Just extremely painful in that area; runs across the lower back to the middle  The pain stays right there across the top part of the lateral buttock/hip Feels like it is connected to sciatic nerve but does not wrap around to the front or down the leg No bowel or bladder dysfunction When she walks for exercise, feels like she is going to stumble with it She bought a pillow from [employer], side sleeper pillow and restuffed with another pillow; keeps gap in the legs from closing up Aching nonstop pain Not shingles, never had a rash She actually went to the ER, not taking both naproxen and ibuprofen; they gave her a shot in the hip The ibuprofen does help, but does not use every day This has been going on and off for a year, only 10-15 days of relief in between The muscle relaxant for her jaw has helped the hip some No trial of steroids done  Relevant past medical, surgical, family and social history reviewed and updated as indicated. Interim medical history since our last visit reviewed. Allergies and medications reviewed and updated.  Review of Systems Per HPI unless specifically indicated above     Objective:    BP 103/69 mmHg  Pulse 75  Temp(Src) 97.4 F (36.3 C)  Wt 197 lb (89.359 kg)  SpO2 97%  Wt Readings from Last 3 Encounters:  04/05/15 197 lb  (89.359 kg)  09/21/14 193 lb (87.544 kg)  02/20/15 198 lb (89.812 kg)    Physical Exam  Constitutional: She appears well-developed and well-nourished. No distress.  Cardiovascular: Normal rate.   Pulmonary/Chest: Effort normal.  Musculoskeletal:       Lumbar back: She exhibits decreased range of motion (with rotation to the right and then extension), tenderness and pain. She exhibits no bony tenderness, no swelling, no edema and no spasm.  There is exquisite tenderness to palpation deep in the right buttock upper outer quadrant; moderate tenderness over the right SI joint; no tenderness over the midline  Neurological: She is alert. She displays no atrophy and no tremor. She exhibits normal muscle tone. Gait normal.  Reflex Scores:      Patellar reflexes are 2+ on the right side and 2+ on the left side. Psychiatric: She has a normal mood and affect. Her speech is normal.   Results for orders placed or performed during the hospital encounter of 02/10/15  Urinalysis, Routine w reflex microscopic  Result Value Ref Range   Color, Urine YELLOW YELLOW   APPearance HAZY (A) CLEAR   Specific Gravity, Urine 1.016 1.005 - 1.030   pH 6.0 5.0 - 8.0   Glucose, UA NEGATIVE NEGATIVE mg/dL   Hgb urine dipstick NEGATIVE NEGATIVE  Bilirubin Urine NEGATIVE NEGATIVE   Ketones, ur NEGATIVE NEGATIVE mg/dL   Protein, ur NEGATIVE NEGATIVE mg/dL   Urobilinogen, UA 1.0 0.0 - 1.0 mg/dL   Nitrite NEGATIVE NEGATIVE   Leukocytes, UA NEGATIVE NEGATIVE      Assessment & Plan:   Problem List Items Addressed This Visit      Other   Chronic right hip pain - Primary    Suspect deep trigger point over piriformis; discussed with Dr. Laural BenesJohnson here; she suggested referral to ortho for consideration of trigger point injection; will use round of steroids; avoid other NSAIDs while on prednisone; hydrocodone for breakthrough pain, controlled substance speech given, do not mix with alcohol, other controlleds; please do  call with any problems      Relevant Orders   Ambulatory referral to Orthopedic Surgery       Follow up plan: Return if symptoms worsen or fail to improve.

## 2015-04-05 NOTE — Assessment & Plan Note (Signed)
Suspect deep trigger point over piriformis; discussed with Dr. Laural BenesJohnson here; she suggested referral to ortho for consideration of trigger point injection; will use round of steroids; avoid other NSAIDs while on prednisone; hydrocodone for breakthrough pain, controlled substance speech given, do not mix with alcohol, other controlleds; please do call with any problems

## 2015-04-07 ENCOUNTER — Ambulatory Visit (INDEPENDENT_AMBULATORY_CARE_PROVIDER_SITE_OTHER): Payer: BC Managed Care – PPO | Admitting: Obstetrics and Gynecology

## 2015-04-07 ENCOUNTER — Encounter: Payer: Self-pay | Admitting: Obstetrics and Gynecology

## 2015-04-07 VITALS — BP 107/73 | HR 85 | Ht 67.0 in | Wt 196.0 lb

## 2015-04-07 DIAGNOSIS — Z113 Encounter for screening for infections with a predominantly sexual mode of transmission: Secondary | ICD-10-CM

## 2015-04-07 DIAGNOSIS — N958 Other specified menopausal and perimenopausal disorders: Secondary | ICD-10-CM | POA: Diagnosis not present

## 2015-04-07 DIAGNOSIS — Z01419 Encounter for gynecological examination (general) (routine) without abnormal findings: Secondary | ICD-10-CM

## 2015-04-07 DIAGNOSIS — E8940 Asymptomatic postprocedural ovarian failure: Secondary | ICD-10-CM | POA: Insufficient documentation

## 2015-04-07 NOTE — Progress Notes (Signed)
Patient ID: Brittney Johns, female   DOB: 05/24/1971, 44 y.o.   MRN: [id number] ANNUAL PREVENTATIVE CARE GYN  ENCOUNTER NOTE  Subjective:       Brittney Johns is a 44 y.o. [id number] female here for a routine annual gynecologic exam.  Current complaints: 1.  Annual 2.  History of endometriosis. 3.  History of interstitial cystitis. 4.  History of IBS 5.  Surgical menopause   Gynecologic History No LMP recorded. Patient has had a hysterectomy.TVH Laparoscopic BSO Contraception: TVH Last Pap: 06/17/2013 neg/neg.  Last mammogram: 06/30/2013 BiRads 0; Diagnostic: 07/11/2013: Birads 2  Obstetric History OB History  Gravida Para Term Preterm AB SAB TAB Ectopic Multiple Living  1 1 1       1     # Outcome Date GA Lbr Len/2nd Weight Sex Delivery Anes PTL Lv  1 Term 1989    M Vag-Spont   Y      Past Medical History  Diagnosis Date  . Migraine   . Depression with anxiety   . Interstitial cystitis   . Endometriosis   . Migraine   . Depression   . Fatigue   . Allergy   . IBS (irritable bowel syndrome)     Past Surgical History  Procedure Laterality Date  . Facial surgey      open bite wound  . Ganglion cyst excision    . Tubal ligation    . Abdominal hysterectomy      complete.  . Bilateral oophorectomy  June 2015    Current Outpatient Prescriptions on File Prior to Visit  Medication Sig Dispense Refill  . albuterol (PROVENTIL HFA;VENTOLIN HFA) 108 (90 BASE) MCG/ACT inhaler Inhale 2 puffs into the lungs every 6 (six) hours as needed for wheezing or shortness of breath.    Marland Kitchen. aspirin 81 MG chewable tablet Chew 81 mg by mouth daily.    . B Complex Vitamins (B COMPLEX PO) Take by mouth daily.    Marland Kitchen. BIOTIN PO Take by mouth daily.    . calcium carbonate (OS-CAL) 600 MG TABS tablet Take 600 mg by mouth 2 (two) times daily with a meal.    . citalopram (CELEXA) 20 MG tablet Take 20 mg by mouth daily.     . Coenzyme Q10 (CO Q 10 PO) Take by mouth daily.    . cyclobenzaprine  (FLEXERIL) 5 MG tablet Take 5 mg by mouth every 8 (eight) hours as needed for muscle spasms.    Marland Kitchen. estradiol (ESTRACE) 1 MG tablet TAKE 1 AND 1/2 (ONE AND A HALF) TABLET BY MOUTH DAILY 45 tablet 3  . fluticasone (FLONASE) 50 MCG/ACT nasal spray Place 2 sprays into both nostrils daily.    Marland Kitchen. HYDROcodone-acetaminophen (NORCO/VICODIN) 5-325 MG per tablet Take 1 tablet by mouth every 6 (six) hours as needed for moderate pain. 20 tablet 0  . ibuprofen (ADVIL,MOTRIN) 600 MG tablet Take 1 tablet (600 mg total) by mouth every 6 (six) hours as needed. 30 tablet 0  . imipramine (TOFRANIL) 25 MG tablet Take 25 mg by mouth at bedtime.    . Omega-3 Fatty Acids (FISH OIL PO) Take by mouth daily.    . predniSONE (DELTASONE) 10 MG tablet Six pills by mouth on day 1, then 5 pills on day 2, then 4 pills on day 3, 3 on day 4, 2 on day 5, and 1 on day 6 21 tablet 0  . topiramate (TOPAMAX) 100 MG tablet Take 1 tablet (100 mg total) by mouth  daily. 90 tablet 3  . verapamil (CALAN) 80 MG tablet Take 1 tablet (80 mg total) by mouth 2 (two) times daily. 60 tablet 11   No current facility-administered medications on file prior to visit.    Allergies  Allergen Reactions  . Imitrex [Sumatriptan] Anaphylaxis    History   Social History  . Marital Status: Single    Spouse Name: N/A  . Number of Children: 1  . Years of Education: 12+   Occupational History  . Not on file.   Social History Main Topics  . Smoking status: Never Smoker   . Smokeless tobacco: Never Used  . Alcohol Use: No  . Drug Use: No  . Sexual Activity:    Partners: Male   Other Topics Concern  . Not on file   Social History Narrative   Patient lives with her mother and son.   Patient works for [employer].          Family History  Problem Relation Age of Onset  . Aneurysm Father   . Dementia Mother   . Hypertension Mother   . Kidney disease Mother   . Renal Disease    . Hypertension Brother   . Migraines  Son     The following portions of the patient's history were reviewed and updated as appropriate: allergies, current medications, past family history, past medical history, past social history, past surgical history and problem list.  Review of Systems ROS Review of Systems - General ROS: negative for - chills, fatigue, fever, hot flashes, night sweats, weight gain or weight loss Psychological ROS: negative for - anxiety, decreased libido, depression, mood swings, physical abuse or sexual abuse Ophthalmic ROS: negative for - blurry vision, eye pain or loss of vision ENT ROS: negative for - headaches, hearing change, visual changes or vocal changes Allergy and Immunology ROS: negative for - hives, itchy/watery eyes or seasonal allergies Hematological and Lymphatic ROS: negative for - bleeding problems, bruising, swollen lymph nodes or weight loss Endocrine ROS: negative for - galactorrhea, hair pattern changes, hot flashes, malaise/lethargy, mood swings, palpitations, polydipsia/polyuria, skin changes, temperature intolerance or unexpected weight changes Breast ROS: negative for - new or changing breast lumps or nipple discharge Respiratory ROS: negative for - cough or shortness of breath Cardiovascular ROS: negative for - chest pain, irregular heartbeat, palpitations or shortness of breath Gastrointestinal ROS: no abdominal pain, change in bowel habits, or black or bloody stools Genito-Urinary ROS: no dysuria, trouble voiding, or hematuria Musculoskeletal ROS: negative for - joint pain or joint stiffness Neurological ROS: negative for - bowel and bladder control changes Dermatological ROS: negative for rash and skin lesion changes   Objective:   BP 107/73 mmHg  Pulse 85  Ht  (1.702 m)  Wt 196 lb (88.905 kg)  BMI 30.69 kg/m2 CONSTITUTIONAL: Well-developed, well-nourished female in no acute distress.  PSYCHIATRIC: Normal mood and affect. Normal behavior. Normal judgment and thought  content. NEUROLGIC: Alert and oriented to person, place, and time. Normal muscle tone coordination. No cranial nerve deficit noted. HENT:  Normocephalic, atraumatic, External right and left ear normal. Oropharynx is clear and moist EYES: Conjunctivae and EOM are normal. Pupils are equal, round, and reactive to light. No scleral icterus.  NECK: Normal range of motion, supple, no masses.  Normal thyroid.  SKIN: Skin is warm and dry. No rash noted. Not diaphoretic. No erythema. No pallor. CARDIOVASCULAR: Normal heart rate noted, regular rhythm, no murmur. RESPIRATORY: Clear to auscultation bilaterally. Effort and  breath sounds normal, no problems with respiration noted. BREASTS: Symmetric in size. No masses, skin changes, nipple drainage, or lymphadenopathy. ABDOMEN: Soft, normal bowel sounds, no distention noted.  No tenderness, rebound or guarding.  BLADDER: Normal PELVIC:  External Genitalia: Normal  BUS: Normal  Vagina: Normal  Cervix: Surgically absent  Uterus: Surgically absent  Adnexa: Normal  RV: external hemorrhoids, No Rectal Masses and Normal Sphincter tone  MUSCULOSKELETAL: Normal range of motion. No tenderness.  No cyanosis, clubbing, or edema.  2+ distal pulses. LYMPHATIC: No Axillary, Supraclavicular, or Inguinal Adenopathy.    Assessment:   Annual gynecologic examination 44 y.o. Status post TVH Status post laparoscopic BSO BMI: 30.8 Interstitial cystitis, stable. IBS, stable. Surgical menopause, asymptomatic on estradiol 1 mg daily  Plan:  Pap: Not needed. No further paps needed. Mammogram: ordered Stool Guaiac Testing:  Not Indicated Labs: Lipid, TSH, FBS, HbgA1c, Vitamin D level.  STD screen requested also. Routine preventative health maintenance measures emphasized: Exercise/Diet/Weight control, Alcohol/Substance use risks and Safe Sex Refill estradiol 1 mg daily Return to Clinic - 1 Year   Fenton Malling, LPN   Herold Harms, MD

## 2015-04-07 NOTE — Patient Instructions (Signed)
Pap: Not needed. No further paps needed.  Mammogram: ordered  Stool Guaiac Testing: Not Indicated  Labs: Lipid, TSH, FBS, HbgA1c, Vitamin D level. STD screen requested also.  Routine preventative health maintenance measures emphasized: Exercise/Diet/Weight control, Alcohol/Substance use risks and Safe Sex  Refill estradiol 1 mg daily  Return to Clinic - 1 Year

## 2015-04-08 LAB — HEMOGLOBIN A1C
ESTIMATED AVERAGE GLUCOSE: 120 mg/dL
HEMOGLOBIN A1C: 5.8 % — AB (ref 4.8–5.6)

## 2015-04-08 LAB — RPR: RPR: NONREACTIVE

## 2015-04-08 LAB — HEPATITIS B SURFACE ANTIGEN: Hepatitis B Surface Ag: NEGATIVE

## 2015-04-08 LAB — LIPID PANEL
CHOL/HDL RATIO: 1.9 ratio (ref 0.0–4.4)
CHOLESTEROL TOTAL: 181 mg/dL (ref 100–199)
HDL: 96 mg/dL (ref >39)
LDL Calculated: 72 mg/dL (ref 0–99)
Triglycerides: 65 mg/dL (ref 0–149)
VLDL CHOLESTEROL CAL: 13 mg/dL (ref 5–40)

## 2015-04-08 LAB — VITAMIN D 25 HYDROXY (VIT D DEFICIENCY, FRACTURES): Vit D, 25-Hydroxy: 39.3 ng/mL (ref 30.0–100.0)

## 2015-04-08 LAB — HEPATITIS C ANTIBODY

## 2015-04-08 LAB — HSV(HERPES SIMPLEX VRS) I + II AB-IGG
HSV 1 GLYCOPROTEIN G AB, IGG: 7.38 {index} — AB (ref 0.00–0.90)
HSV 2 Glycoprotein G Ab, IgG: <0.91 index (ref 0.00–0.90)

## 2015-04-08 LAB — TSH: TSH: 0.636 u[IU]/mL (ref 0.450–4.500)

## 2015-04-08 LAB — HIV ANTIBODY (ROUTINE TESTING W REFLEX): HIV Screen 4th Generation wRfx: NONREACTIVE

## 2015-04-08 LAB — GLUCOSE, FASTING: GLUCOSE, PLASMA: 78 mg/dL (ref 65–99)

## 2015-04-09 LAB — GC/CHLAMYDIA PROBE AMP
Chlamydia trachomatis, NAA: NEGATIVE
NEISSERIA GONORRHOEAE BY PCR: NEGATIVE

## 2015-05-20 DIAGNOSIS — R7303 Prediabetes: Secondary | ICD-10-CM

## 2015-05-20 HISTORY — DX: Prediabetes: R73.03

## 2015-07-16 ENCOUNTER — Ambulatory Visit (INDEPENDENT_AMBULATORY_CARE_PROVIDER_SITE_OTHER): Payer: BC Managed Care – PPO | Admitting: Family Medicine

## 2015-07-16 ENCOUNTER — Encounter: Payer: Self-pay | Admitting: Family Medicine

## 2015-07-16 VITALS — BP 110/75 | HR 96 | Temp 97.9°F | Ht 67.0 in | Wt 202.0 lb

## 2015-07-16 DIAGNOSIS — E669 Obesity, unspecified: Secondary | ICD-10-CM | POA: Diagnosis not present

## 2015-07-16 DIAGNOSIS — J069 Acute upper respiratory infection, unspecified: Secondary | ICD-10-CM | POA: Diagnosis not present

## 2015-07-16 MED ORDER — FLUTICASONE PROPIONATE 50 MCG/ACT NA SUSP: 2.0000 | Freq: Every day | NASAL | Status: DC

## 2015-07-16 MED ORDER — FLUCONAZOLE 150 MG PO TABS: 150.0000 mg | ORAL_TABLET | Freq: Once | ORAL | Status: DC

## 2015-07-16 MED ORDER — AMOXICILLIN 875 MG PO TABS: 875.0000 mg | ORAL_TABLET | Freq: Two times a day (BID) | ORAL | Status: DC

## 2015-07-16 NOTE — Progress Notes (Signed)
BP 110/75 mmHg  Pulse 96  Temp(Src) 97.9 F (36.6 C)  Ht  (1.702 m)  Wt 202 lb (91.627 kg)  BMI 31.63 kg/m2  SpO2 97%   Subjective:    Patient ID: Brittney Johns, female    DOB: November 18, 1970, 44 y.o.   MRN: [id number]  HPI: Brittney Johns is a 44 y.o. female  Chief Complaint  Patient presents with  . Sore Throat    Slight. Patient states possibly from drainage.   . Sinus Problem  . Sinusitis    Headache  . Nasal Congestion    Yellow thick in color.     She got sick on Saturday; started with clear drainage; started taking Mucinex; then on around Tuesday and Wednesday, it got more difficulty to breathe and was coughing up thick greenish stuff, stringy and green; clump would fall down the back of her throat; changed its course and had a little bit of blood in it; feels like glue, stuck in her chest; throat hurt at times, from drainage, not like strep; if she can get it to loosen up, then it helps and she cough it up; all feels stuck; ears have been off and on; left ear bothered her and there was some pressure, not continual, off and on; no rash; yesterday, she felt horrible, sitting at a meeting; out of work today for her mother; kids have been in and out of school with bronchitis and other stuff; 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 Interim medical history since our last visit reviewed. Allergies and medications reviewed and updated.  Review of Systems Per HPI unless specifically indicated above     Objective:    BP 110/75 mmHg  Pulse 96  Temp(Src) 97.9 F (36.6 C)  Ht  (1.702 m)  Wt 202 lb (91.627 kg)  BMI 31.63  kg/m2  SpO2 97%  Wt Readings from Last 3 Encounters:  07/16/15 202 lb (91.627 kg)  04/07/15 196 lb (88.905 kg)  04/05/15 197 lb (89.359 kg)    Physical Exam  Constitutional: She appears well-developed and well-nourished.  obese  HENT:  Head: Normocephalic and atraumatic.  Right Ear: Hearing, tympanic membrane, external ear and ear canal normal.  Left Ear: Hearing, tympanic membrane, external ear and ear canal normal.  Nose: Rhinorrhea present.  Mouth/Throat: Oropharynx is clear and moist and mucous membranes are normal. No oropharyngeal exudate.  Eyes: EOM are normal. Right eye exhibits no discharge. Left eye exhibits no discharge. No scleral icterus.  Cardiovascular: Normal rate and regular rhythm.   Pulmonary/Chest: Effort normal and breath sounds normal. She has no decreased breath sounds. She has no wheezes.  Lymphadenopathy:    She has no cervical adenopathy.      Assessment & Plan:   Problem List Items Addressed This Visit      Respiratory   Upper respiratory infection    Suspect viral etiology; discussed risks of antibiotics; since it is Friday, will give ABX RX to use ONLY if fever, unilateral symptoms, etc.; risk of C diff discussed; symptomatic, supportive care      Relevant Medications   fluconazole (DIFLUCAN) 150 MG tablet  Other   Obesity - Primary    Advised against diet pills; see AVS; encouraged healthy eating, increased activity, [email or web address] article         Follow up plan: Return if symptoms worsen or fail to improve. Meds ordered this encounter  Medications  . fluticasone (FLONASE) 50 MCG/ACT nasal spray    Sig: Place 2 sprays into both nostrils daily.    Dispense:  16 g    Refill:  11  . amoxicillin (AMOXIL) 875 MG tablet    Sig: Take 1 tablet (875 mg total) by mouth 2 (two) times daily.    Dispense:  14 tablet    Refill:  0  . fluconazole (DIFLUCAN) 150 MG tablet    Sig: Take 1 tablet (150 mg total) by mouth once.    Dispense:  1  tablet    Refill:  0  Only fill and take the antibiotics if secondary bacterial infection develops Only fill and take the antifungal if yeast infection develops  An after-visit summary was printed and given to the patient at check-out.  Please see the patient instructions which may contain other information and recommendations beyond what is mentioned above in the assessment and plan.

## 2015-07-16 NOTE — Patient Instructions (Addendum)
Pick up one box of Allegra-D and use that during your acute illness Try vitamin C (orange juice if not diabetic or vitamin C tablets) and drink green tea to help your immune system during your illness Get plenty of rest and hydration Use the antibiotic ONLY if needed for worsening / secondary bacterial infection I think you should clear this on your own in a few days if truly viral   Check out the information at [email or web address] entitled "What It Takes to Lose Weight" Try to lose between 1-2 pounds per week by taking in fewer calories and burning off more calories You can succeed by limiting portions, limiting foods dense in calories and fat, becoming more active, and drinking 8 glasses of water a day (64 ounces) Don't skip meals, especially breakfast, as skipping meals may alter your metabolism Do not use over-the-counter weight loss pills or gimmicks that claim rapid weight loss A healthy BMI (or body mass index) is between 18.5 and 24.9 You can calculate your ideal BMI at the NIH website [employer]huhttp://www.nhlbi.nih.gov/health/educational/lose_wt/BMI/bmicalc.htm Myf[employer]

## 2015-07-21 DIAGNOSIS — J069 Acute upper respiratory infection, unspecified: Secondary | ICD-10-CM | POA: Insufficient documentation

## 2015-07-21 DIAGNOSIS — E663 Overweight: Secondary | ICD-10-CM | POA: Insufficient documentation

## 2015-07-21 NOTE — Assessment & Plan Note (Signed)
Suspect viral etiology; discussed risks of antibiotics; since it is Friday, will give ABX RX to use ONLY if fever, unilateral symptoms, etc.; risk of C diff discussed; symptomatic, supportive care

## 2015-07-21 NOTE — Assessment & Plan Note (Signed)
Advised against diet pills; see AVS; encouraged healthy eating, increased activity, [email or web address] article

## 2015-08-11 ENCOUNTER — Encounter: Payer: Self-pay | Admitting: Obstetrics and Gynecology

## 2015-09-13 ENCOUNTER — Other Ambulatory Visit: Payer: Self-pay | Admitting: Obstetrics and Gynecology

## 2015-09-15 ENCOUNTER — Telehealth: Payer: Self-pay | Admitting: Obstetrics and Gynecology

## 2015-09-15 MED ORDER — CITALOPRAM HYDROBROMIDE 40 MG PO TABS: 40.0000 mg | ORAL_TABLET | Freq: Every day | ORAL | Status: DC

## 2015-09-15 NOTE — Telephone Encounter (Signed)
Pt aware Celexa and estradiol erx.

## 2015-09-15 NOTE — Telephone Encounter (Signed)
Patient called requesting a refill on estradiol and celexa. She uses the [facility] in [place]. Thanks

## 2015-10-19 ENCOUNTER — Ambulatory Visit: Payer: BC Managed Care – PPO | Admitting: Nurse Practitioner

## 2015-10-20 ENCOUNTER — Encounter: Payer: Self-pay | Admitting: Nurse Practitioner

## 2015-10-31 ENCOUNTER — Other Ambulatory Visit: Payer: Self-pay | Admitting: Nurse Practitioner

## 2015-11-01 ENCOUNTER — Telehealth: Payer: Self-pay | Admitting: *Deleted

## 2015-11-01 NOTE — Telephone Encounter (Signed)
I called and no answer.   It has been a yr since last appt.   Gave 30 day supply Topamax  po daily  and have pt call for appt.

## 2015-11-08 ENCOUNTER — Other Ambulatory Visit: Payer: Self-pay | Admitting: Nurse Practitioner

## 2015-11-16 ENCOUNTER — Ambulatory Visit (INDEPENDENT_AMBULATORY_CARE_PROVIDER_SITE_OTHER): Payer: BC Managed Care – PPO | Admitting: Nurse Practitioner

## 2015-11-16 ENCOUNTER — Encounter: Payer: Self-pay | Admitting: Nurse Practitioner

## 2015-11-16 VITALS — BP 109/68 | HR 97 | Ht 67.0 in | Wt 200.0 lb

## 2015-11-16 DIAGNOSIS — R51 Headache: Secondary | ICD-10-CM

## 2015-11-16 DIAGNOSIS — G43009 Migraine without aura, not intractable, without status migrainosus: Secondary | ICD-10-CM

## 2015-11-16 DIAGNOSIS — R519 Headache, unspecified: Secondary | ICD-10-CM

## 2015-11-16 MED ORDER — VERAPAMIL HCL 80 MG PO TABS: ORAL_TABLET | ORAL | Status: DC

## 2015-11-16 MED ORDER — TOPIRAMATE 100 MG PO TABS: ORAL_TABLET | ORAL | Status: DC

## 2015-11-16 NOTE — Patient Instructions (Signed)
Patient will continue Topamax as ordered will refill Will continue Verapamil as ordered will refill F/U yearly and prn

## 2015-11-16 NOTE — Progress Notes (Signed)
[facility]  PATIENT: Brittney Johns DOB: 31-Dec-1970   REASON FOR VISIT: Follow-up for migraines HISTORY FROM: Patient    HISTORY OF PRESENT ILLNESS:[name], 45 year old female returns for followup. She was last seen in this office 10/15/14.  Her migraines are in good control with a combination of verapamil and Topamax. Her insurance does not cover Frova. She is allergic to Imitrex. She has had more sinus  headaches recently. She needs refills. Recently diagnosed as prediabetic. She has lost some weight. She returns for reevaluation   HISTORY: of migraines. She is a previous pt of Dr. Thad Johns. She is currently on verapamil and Topamax tolerating medications without side effects. She relates that she has had more tension headaches recently, her mother has dementia and recently diagnosed with poor kidney function and poor circulation. 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 She gets very little help from her siblings. She says that she is aware that her headaches are worse due to her additional stress and lack of sleep.    REVIEW OF SYSTEMS: Full 14 system review of systems performed and notable only for those listed, all others are neg:  Constitutional: neg  Cardiovascular: neg Ear/Nose/Throat: neg  Skin: neg Eyes: neg Respiratory: neg Gastroitestinal: neg  Hematology/Lymphatic: neg  Endocrine: neg Musculoskeletal:neg Allergy/Immunology: neg Neurological: neg Psychiatric: neg Sleep : neg   ALLERGIES: Allergies  Allergen Reactions  . Imitrex [Sumatriptan] Anaphylaxis    HOME MEDICATIONS: Outpatient Prescriptions Prior to Visit  Medication Sig Dispense Refill  . albuterol (PROVENTIL HFA;VENTOLIN HFA) 108 (90 BASE) MCG/ACT inhaler Inhale 2 puffs into the lungs every 6 (six) hours as needed for wheezing or shortness of breath.    Marland Kitchen aspirin 81 MG chewable tablet Chew 81 mg by mouth daily.    . B Complex Vitamins (B  COMPLEX PO) Take by mouth daily.    Marland Kitchen BIOTIN PO Take by mouth daily.    . calcium carbonate (OS-CAL) 600 MG TABS tablet Take 600 mg by mouth 2 (two) times daily with a meal.    . citalopram (CELEXA) 40 MG tablet Take 1 tablet (40 mg total) by mouth daily. 30 tablet 6  . Coenzyme Q10 (CO Q 10 PO) Take by mouth daily.    Marland Kitchen estradiol (ESTRACE) 1 MG tablet TAKE 1 AND 1/2 (ONE AND A HALF) TABLET BY MOUTH DAILY 45 tablet 6  . fluticasone (FLONASE) 50 MCG/ACT nasal spray Place 2 sprays into both nostrils daily. 16 g 11  . imipramine (TOFRANIL) 25 MG tablet Take 25 mg by mouth at bedtime.    . Omega-3 Fatty Acids (FISH OIL PO) Take by mouth daily.    Marland Kitchen topiramate (TOPAMAX) 100 MG tablet TAKE 1 TABLET (100 MG TOTAL) BY MOUTH DAILY. 30 tablet 0  . verapamil (CALAN) 80 MG tablet TAKE 1 TABLET (80 MG TOTAL) BY MOUTH 2 (TWO) TIMES DAILY. 60 tablet 0  . amoxicillin (AMOXIL) 875 MG tablet Take 1 tablet (875 mg total) by mouth 2 (two) times daily. (Patient not taking: Reported on 11/16/2015) 14 tablet 0  . cyclobenzaprine (FLEXERIL) 5 MG tablet Take 5 mg by mouth every 8 (eight) hours as needed for muscle spasms.    . fluconazole (DIFLUCAN) 150 MG tablet Take 1 tablet (150 mg total) by mouth once. 1 tablet 0  . ibuprofen (ADVIL,MOTRIN) 600 MG tablet Take 1 tablet (600 mg total) by mouth every 6 (six) hours as needed. 30 tablet 0   No  facility-administered medications prior to visit.    PAST MEDICAL HISTORY: Past Medical History  Diagnosis Date  . Migraine   . Depression with anxiety   . Interstitial cystitis   . Endometriosis   . Migraine   . Depression   . Fatigue   . Allergy   . IBS (irritable bowel syndrome)   . Pre-diabetes 05/2015    PAST SURGICAL HISTORY: Past Surgical History  Procedure Laterality Date  . Facial surgey      open bite wound  . Ganglion cyst excision    . Tubal ligation    . Abdominal hysterectomy      complete.  . Bilateral oophorectomy  June 2015    FAMILY  HISTORY: Family History  Problem Relation Age of Onset  . Aneurysm Father   . Dementia Mother   . Hypertension Mother   . Kidney disease Mother   . Renal Disease    . Hypertension Brother   . Migraines Son     SOCIAL HISTORY: Social History   Social History  . Marital Status: Single    Spouse Name: N/A  . Number of Children: 1  . Years of Education: 12+   Occupational History  . Not on file.   Social History Main Topics  . Smoking status: Never Smoker   . Smokeless tobacco: Never Used  . Alcohol Use: No  . Drug Use: No  . Sexual Activity:    Partners: Male   Other Topics Concern  . Not on file   Social History Narrative   Patient lives with her mother and son.   Patient works for [employer].           PHYSICAL EXAM  Filed Vitals:   11/16/15 1610  BP: 109/68  Pulse: 97  Height:  (1.702 m)  Weight: 200 lb (90.719 kg)   Body mass index is 31.32 kg/(m^2). Generalized: Well developed, in no acute distress   Neurological examination   Mentation: Alert oriented to time, place, history taking. Follows all commands speech and language fluent  Cranial nerve II-XII: Pupils were equal round reactive to light extraocular movements were full, visual field were full on confrontational test. Facial sensation and strength were normal. hearing was intact to finger rubbing bilaterally. Uvula tongue midline. head turning and shoulder shrug were normal and symmetric.Tongue protrusion into cheek strength was normal. Motor: normal bulk and tone, full strength in the BUE, BLE, No focal weakness Coordination: finger-nose-finger, heel-to-shin bilaterally, no dysmetria Reflexes: 1+ upper lower and symmetric  Gait and Station: Rising up from seated position without assistance, normal stance, moderate stride, good arm swing, smooth turning, able to perform tiptoe, and heel walking without difficulty. Tandem gait is steady DIAGNOSTIC DATA (LABS,  IMAGING, TESTING)   ASSESSMENT AND PLAN  45 y.o. year old female  has a past medical history of Migraine; Depression with anxiety; Interstitial cystitis; Endometriosis; Migraine; Depression; Fatigue; Allergy; IBS (irritable bowel syndrome); and Pre-diabetes (05/2015). here to follow-up for migraines which are in good control   PLAN: Patient will continue Topamax as ordered will refill Will continue Verapamil as ordered will refill F/U yearly and prn Nilda Riggs, [facility], [facility], APRN  [facility] [address] [place], [place] [address] [phone]

## 2015-11-17 NOTE — Progress Notes (Signed)
I have reviewed and agreed above plan. 

## 2015-12-15 ENCOUNTER — Encounter: Payer: Self-pay | Admitting: *Deleted

## 2015-12-15 ENCOUNTER — Ambulatory Visit
Admission: EM | Admit: 2015-12-15 | Discharge: 2015-12-15 | Disposition: A | Discharge status: Home / Self Care | Payer: BC Managed Care – PPO | Attending: Family Medicine | Admitting: Family Medicine

## 2015-12-15 ENCOUNTER — Ambulatory Visit (INDEPENDENT_AMBULATORY_CARE_PROVIDER_SITE_OTHER): Payer: BC Managed Care – PPO

## 2015-12-15 DIAGNOSIS — K5909 Other constipation: Secondary | ICD-10-CM

## 2015-12-15 HISTORY — DX: Essential (primary) hypertension: I10

## 2015-12-15 NOTE — ED Provider Notes (Signed)
CSN: [id number]     Arrival date & time 12/15/15  1616 History   First MD Initiated Contact with Patient 12/15/15 1711     Chief Complaint  Patient presents with  . Abdominal Pain  . Constipation   (Consider location/radiation/quality/duration/timing/severity/associated sxs/prior Treatment) HPI: Patient presents today with symptoms of constipation for approximately 2-3 weeks. She states that she has had small bowel movements over the last few days with the use of enemas and over-the-counter medications. She denies any vomiting, fever. She recently did start a new diet after seeing and nutritionist. She states that she has been eating healthier foods. She does have a history of IBS but has had no problems related to it for a long time. She has a history of a hysterectomy. She denies starting any new medications recently. She admits to drinking plenty of water.  Past Medical History  Diagnosis Date  . Migraine   . Depression with anxiety   . Interstitial cystitis   . Endometriosis   . Migraine   . Depression   . Fatigue   . Allergy   . IBS (irritable bowel syndrome)   . Pre-diabetes 05/2015  . Hypertension    Past Surgical History  Procedure Laterality Date  . Facial surgey      open bite wound  . Ganglion cyst excision    . Tubal ligation    . Abdominal hysterectomy      complete.  . Bilateral oophorectomy  June 2015   Family History  Problem Relation Age of Onset  . Aneurysm Father   . Dementia Mother   . Hypertension Mother   . Kidney disease Mother   . Renal Disease    . Hypertension Brother   . Migraines Son    Social History  Substance Use Topics  . Smoking status: Never Smoker   . Smokeless tobacco: Never Used  . Alcohol Use: No   OB History    Gravida Para Term Preterm AB TAB SAB Ectopic Multiple Living   1 1 1       1      Review of Systems: Negative except mentioned above.   Allergies  Imitrex  Home Medications   Prior to Admission medications    Medication Sig Start Date End Date Taking? Authorizing Provider  aspirin 81 MG chewable tablet Chew 81 mg by mouth daily.   Yes Historical Provider, MD  B Complex Vitamins (B COMPLEX PO) Take by mouth daily.   Yes Historical Provider, MD  BIOTIN PO Take by mouth daily.   Yes Historical Provider, MD  calcium carbonate (OS-CAL) 600 MG TABS tablet Take 600 mg by mouth 2 (two) times daily with a meal.   Yes Historical Provider, MD  citalopram (CELEXA) 40 MG tablet Take 1 tablet (40 mg total) by mouth daily. 09/15/15  Yes Prentice DockerMartin A Defrancesco, MD  Coenzyme Q10 (CO Q 10 PO) Take by mouth daily.   Yes Historical Provider, MD  estradiol (ESTRACE) 1 MG tablet TAKE 1 AND 1/2 (ONE AND A HALF) TABLET BY MOUTH DAILY 09/14/15  Yes Prentice DockerMartin A Defrancesco, MD  fluticasone (FLONASE) 50 MCG/ACT nasal spray Place 2 sprays into both nostrils daily. 07/16/15  Yes Kerman PasseyMelinda P Lada, MD  imipramine (TOFRANIL) 25 MG tablet Take 25 mg by mouth at bedtime.   Yes Historical Provider, MD  Omega-3 Fatty Acids (FISH OIL PO) Take by mouth daily.   Yes Historical Provider, MD  topiramate (TOPAMAX) 100 MG tablet TAKE 1 TABLET (100 MG TOTAL)  BY MOUTH DAILY. 11/16/15  Yes Nilda Riggs, NP  verapamil (CALAN) 80 MG tablet TAKE 1 TABLET (80 MG TOTAL) BY MOUTH 2 (TWO) TIMES DAILY. 11/16/15  Yes Nilda Riggs, NP  albuterol (PROVENTIL HFA;VENTOLIN HFA) 108 (90 BASE) MCG/ACT inhaler Inhale 2 puffs into the lungs every 6 (six) hours as needed for wheezing or shortness of breath.    Historical Provider, MD   Meds Ordered and Administered this Visit  Medications - No data to display  BP 116/72 mmHg  Pulse 93  Temp(Src) 97.9 F (36.6 C) (Oral)  Resp 16  Ht  (1.702 m)  Wt 193 lb (87.544 kg)  BMI 30.22 kg/m2  SpO2 100% No data found.   Physical Exam   GENERAL: NAD HEENT: no pharyngeal erythema, no exudate, no erythema of TMs, no cervical LAD RESP: CTA B CARD: RRR ABD: +BS, NT/ND, no rebound or guarding NEURO:  CN II-XII grossly intact   ED Course  Procedures (including critical care time)  Labs Review Labs Reviewed - No data to display  Imaging Review No results found.    MDM   A/P:Constipation-discussed results of the x-ray with the patient, discussed use of magnesium citrate all only one or two times, start a stool softener daily or increase fiber in diet, increase water in diet, if issues persist or worsen I do recommend that she follow up with GI and primary care provider. Patient addresses understanding of plan.  Jolene Provost, MD 12/15/15 1901

## 2015-12-15 NOTE — ED Notes (Signed)
Pt has been constipated for 2-3 weeks, no bowel movement with the exception a very small bowel movement last Friday morning. Otherwise no bowel movements in past 2-3 weeks. C/o abd pain, bloating, nausea, cramping.

## 2015-12-23 ENCOUNTER — Ambulatory Visit (INDEPENDENT_AMBULATORY_CARE_PROVIDER_SITE_OTHER): Payer: BC Managed Care – PPO | Admitting: Family Medicine

## 2015-12-23 ENCOUNTER — Encounter: Payer: Self-pay | Admitting: Family Medicine

## 2015-12-23 VITALS — BP 122/82 | HR 86 | Temp 98.3°F | Resp 14 | Wt 199.0 lb

## 2015-12-23 DIAGNOSIS — R7309 Other abnormal glucose: Secondary | ICD-10-CM

## 2015-12-23 DIAGNOSIS — K6289 Other specified diseases of anus and rectum: Secondary | ICD-10-CM | POA: Insufficient documentation

## 2015-12-23 DIAGNOSIS — K5900 Constipation, unspecified: Secondary | ICD-10-CM

## 2015-12-23 DIAGNOSIS — J309 Allergic rhinitis, unspecified: Secondary | ICD-10-CM

## 2015-12-23 DIAGNOSIS — R198 Other specified symptoms and signs involving the digestive system and abdomen: Secondary | ICD-10-CM

## 2015-12-23 DIAGNOSIS — R7301 Impaired fasting glucose: Secondary | ICD-10-CM | POA: Insufficient documentation

## 2015-12-23 DIAGNOSIS — R195 Other fecal abnormalities: Secondary | ICD-10-CM | POA: Diagnosis not present

## 2015-12-23 HISTORY — DX: Other abnormal glucose: R73.09

## 2015-12-23 MED ORDER — FLUTICASONE PROPIONATE 50 MCG/ACT NA SUSP: 2.0000 | Freq: Every day | NASAL | Status: DC

## 2015-12-23 NOTE — Progress Notes (Signed)
BP 122/82 mmHg  Pulse 86  Temp(Src) 98.3 F (36.8 C) (Oral)  Resp 14  Wt 199 lb (90.266 kg)  SpO2 97%   Subjective:    Patient ID: Brittney Johns, female    DOB: June 04, 1971, 45 y.o.   MRN: [id number]  HPI: MARIETTE COWLEY is a 45 y.o. female  Chief Complaint  Patient presents with  . Constipation    went to urgent care 1 week  was diagnosied with bowel obstruction.  Has done enema,suppository, and magnesium citrate.  Has finally starting going by using mirlax and stool softners.   She started to have intense pain on March 23rd; intense; went to the urgent care last Thursday; never been regular; had been diagnosed with IBS 10-12 years ago; that was after taking antibiotic and it messed things up; always had weird stomach issues, but it had gotten better over the last few years; might go now 2-3 days between BMs until this last episode; normally takes MIralax and was on miralax when she got backed up; no recent narcotics; no known thyroid disease personally or in family; drinking enough water, 64-96 ounces of water a day; no blood in the stools but stools did look black; they did xray Started working with nutritionist in December; took all the junk out of her diet and started eating healthy; no nausea or vomiting until a couple of days right before going to the ER/urgent care; I asked about stool caliber; she has been taking miralax and stool softerner; no results from mag citrate first time, then got little hard balls after second dose of mag citrate; then ate an apple on Monday and started to get things moving a little; little tiny strands of stools; has the urge to go  DG Abd 2 Views   Status: Final result       PACS Images     Show images for DG Abd 2 Views     Study Result     CLINICAL DATA: Constipation for 2-3 weeks, single minimal bowel movement last Friday morning, abdominal pain, bloating, nausea, cramping  EXAM: ABDOMEN - 2 VIEW  COMPARISON:  None  FINDINGS: Lung bases clear.  Increased stool from ascending colon through descending colon.  Absence of significant stool identified radiographically at the sigmoid colon and rectum.  Gas present at rectum.  No bowel dilatation, bowel wall thickening or evidence of obstruction.  No free intraperitoneal air.  Bones demineralized.  Numerous pelvic phleboliths.  IMPRESSION: Increased stool throughout proximal 2/3 of colon.   Electronically Signed  By: Ulyses Southward M.D.  On: 12/15/2015 18:44    Relevant past medical, surgical, family and social history reviewed and updated as indicated. Interim medical history since our last visit reviewed. Allergies and medications reviewed and updated.  Review of Systems  Per HPI unless specifically indicated above     Objective:    BP 122/82 mmHg  Pulse 86  Temp(Src) 98.3 F (36.8 C) (Oral)  Resp 14  Wt 199 lb (90.266 kg)  SpO2 97%  Wt Readings from Last 3 Encounters:  12/29/15 197 lb (89.359 kg)  12/23/15 199 lb (90.266 kg)  12/15/15 193 lb (87.544 kg)   body mass index is 31.16 kg/(m^2).  Physical Exam  Constitutional: She appears well-developed and well-nourished.  obese  Cardiovascular: Normal rate and regular rhythm.   Pulmonary/Chest: Effort normal and breath sounds normal.  Abdominal: Soft. Bowel sounds are normal. She exhibits no distension and no mass. There is no tenderness. There  is no guarding.  Genitourinary: Rectal exam shows mass and tenderness (mild). Rectal exam shows no fissure and anal tone normal.  Neurological: She is alert.  Skin: No pallor.  Psychiatric: She has a normal mood and affect.      Assessment & Plan:   Problem List Items Addressed This Visit      Respiratory   Allergic rhinitis    Refilled nasal corticosteroid        Digestive   Constipation    Will increased stool burden proximal 2/3 of the colon; palpable rectal mass; refer to general surgeon, will need  colonoscopy; water, fiber, miralax, check TSH      Relevant Orders   Ambulatory referral to General Surgery   TSH (Completed)     Other   Rectal mass - Primary    With change in bowel habits, constipation; check CBC, refer to surgeon      Relevant Orders   Ambulatory referral to General Surgery   Dark stools    Check CBC to r/o anemia which might suggest GI bleed; refer to surgeon      Relevant Orders   Ambulatory referral to General Surgery   CBC with Differential/Platelet (Completed)   Comprehensive metabolic panel (Completed)   Elevated hemoglobin A1c    Due for recheck A1c; weight loss, healthier eating habits, activity      Relevant Orders   Hgb A1c w/o eAG (Completed)      Follow up plan: Return in about 3 weeks (around 01/13/2016) for follow-up.  Meds ordered this encounter  Medications  . fluticasone (FLONASE) 50 MCG/ACT nasal spray    Sig: Place 2 sprays into both nostrils daily.    Dispense:  16 g    Refill:  11    An after-visit summary was printed and given to the patient at check-out.  Please see the patient instructions which may contain other information and recommendations beyond what is mentioned above in the assessment and plan.

## 2015-12-23 NOTE — Patient Instructions (Signed)
I've put in a referral for you to see a general surgeon We'll get labs today Continue the Miralax, water intake, and fiber Call me with ANY problems or concerns

## 2015-12-24 LAB — COMPREHENSIVE METABOLIC PANEL
ALK PHOS: 61 IU/L (ref 39–117)
ALT: 15 IU/L (ref 0–32)
AST: 25 IU/L (ref 0–40)
Albumin/Globulin Ratio: 1.5 (ref 1.2–2.2)
Albumin: 4.2 g/dL (ref 3.5–5.5)
BUN/Creatinine Ratio: 13 (ref 9–23)
BUN: 11 mg/dL (ref 6–24)
Bilirubin Total: <0.2 mg/dL (ref 0.0–1.2)
CO2: 28 mmol/L (ref 18–29)
CREATININE: 0.82 mg/dL (ref 0.57–1.00)
Calcium: 9.8 mg/dL (ref 8.7–10.2)
Chloride: 98 mmol/L (ref 96–106)
GFR calc Af Amer: 99 mL/min/{1.73_m2} (ref >59)
GFR calc non Af Amer: 86 mL/min/{1.73_m2} (ref >59)
GLOBULIN, TOTAL: 2.8 g/dL (ref 1.5–4.5)
Glucose: 74 mg/dL (ref 65–99)
POTASSIUM: 4.2 mmol/L (ref 3.5–5.2)
SODIUM: 141 mmol/L (ref 134–144)
Total Protein: 7 g/dL (ref 6.0–8.5)

## 2015-12-24 LAB — CBC WITH DIFFERENTIAL/PLATELET
BASOS ABS: 0 10*3/uL (ref 0.0–0.2)
BASOS: 1 %
EOS (ABSOLUTE): 0.3 10*3/uL (ref 0.0–0.4)
Eos: 4 %
Hematocrit: 39 % (ref 34.0–46.6)
Hemoglobin: 13 g/dL (ref 11.1–15.9)
IMMATURE GRANS (ABS): 0 10*3/uL (ref 0.0–0.1)
IMMATURE GRANULOCYTES: 0 %
LYMPHS: 41 %
Lymphocytes Absolute: 2.6 10*3/uL (ref 0.7–3.1)
MCH: 31.3 pg (ref 26.6–33.0)
MCHC: 33.3 g/dL (ref 31.5–35.7)
MCV: 94 fL (ref 79–97)
MONOS ABS: 0.5 10*3/uL (ref 0.1–0.9)
Monocytes: 9 %
NEUTROS PCT: 45 %
Neutrophils Absolute: 2.8 10*3/uL (ref 1.4–7.0)
PLATELETS: 385 10*3/uL — AB (ref 150–379)
RBC: 4.16 x10E6/uL (ref 3.77–5.28)
RDW: 13.1 % (ref 12.3–15.4)
WBC: 6.3 10*3/uL (ref 3.4–10.8)

## 2015-12-24 LAB — TSH: TSH: 1.08 u[IU]/mL (ref 0.450–4.500)

## 2015-12-24 LAB — HGB A1C W/O EAG: HEMOGLOBIN A1C: 5.9 % — AB (ref 4.8–5.6)

## 2015-12-28 ENCOUNTER — Other Ambulatory Visit: Payer: Self-pay

## 2015-12-28 MED ORDER — CITALOPRAM HYDROBROMIDE 40 MG PO TABS: 40.0000 mg | ORAL_TABLET | Freq: Every day | ORAL | Status: DC

## 2015-12-28 NOTE — Telephone Encounter (Signed)
Per pt request 90 day supply erx to [facility].

## 2015-12-29 ENCOUNTER — Encounter: Payer: Self-pay | Admitting: General Surgery

## 2015-12-29 ENCOUNTER — Ambulatory Visit (INDEPENDENT_AMBULATORY_CARE_PROVIDER_SITE_OTHER): Payer: BC Managed Care – PPO | Admitting: General Surgery

## 2015-12-29 VITALS — BP 110/70 | HR 79 | Resp 13 | Ht 67.0 in | Wt 197.0 lb

## 2015-12-29 DIAGNOSIS — K5900 Constipation, unspecified: Secondary | ICD-10-CM

## 2015-12-29 DIAGNOSIS — K5909 Other constipation: Secondary | ICD-10-CM

## 2015-12-29 MED ORDER — POLYETHYLENE GLYCOL 3350 17 GM/SCOOP PO POWD: ORAL | Status: DC

## 2015-12-29 NOTE — Patient Instructions (Addendum)
Use Miralax daily.  Colonoscopy A colonoscopy is an exam to look at the entire large intestine (colon). This exam can help find problems such as tumors, polyps, inflammation, and areas of bleeding. The exam takes about 1 hour.  LET [facility] PROVIDER KNOW ABOUT:   Any allergies you have.  All medicines you are taking, including vitamins, herbs, eye drops, creams, and over-the-counter medicines.  Previous problems you or members of your family have had with the use of anesthetics.  Any blood disorders you have.  Previous surgeries you have had.  Medical conditions you have. RISKS AND COMPLICATIONS  Generally, this is a safe procedure. However, as with any procedure, complications can occur. Possible complications include:  Bleeding.  Tearing or rupture of the colon wall.  Reaction to medicines given during the exam.  Infection (rare). BEFORE THE PROCEDURE   Ask your health care provider about changing or stopping your regular medicines.  You may be prescribed an oral bowel prep. This involves drinking a large amount of medicated liquid, starting the day before your procedure. The liquid will cause you to have multiple loose stools until your stool is almost clear or light green. This cleans out your colon in preparation for the procedure.  Do not eat or drink anything else once you have started the bowel prep, unless your health care provider tells you it is safe to do so.  Arrange for someone to drive you home after the procedure. PROCEDURE   You will be given medicine to help you relax (sedative).  You will lie on your side with your knees bent.  A long, flexible tube with a light and camera on the end (colonoscope) will be inserted through the rectum and into the colon. The camera sends video back to a computer screen as it moves through the colon. The colonoscope also releases carbon dioxide gas to inflate the colon. This helps your health care provider see the  area better.  During the exam, your health care provider may take a small tissue sample (biopsy) to be examined under a microscope if any abnormalities are found.  The exam is finished when the entire colon has been viewed. AFTER THE PROCEDURE   Do not drive for 24 hours after the exam.  You may have a small amount of blood in your stool.  You may pass moderate amounts of gas and have mild abdominal cramping or bloating. This is caused by the gas used to inflate your colon during the exam.  Ask when your test results will be ready and how you will get your results. Make sure you get your test results.   This information is not intended to replace advice given to you by your health care provider. Make sure you discuss any questions you have with your health care provider.   Document Released: 09/01/2000 Document Revised: 06/25/2013 Document Reviewed: 05/12/2013 Elsevier Interactive Patient Education [employer]2016 [employer]  Patient has [facility].

## 2015-12-29 NOTE — Progress Notes (Signed)
Patient ID: Brittney Johns, female   DOB: 1971-04-05, 45 y.o.   MRN: [id number]  Chief Complaint  Patient presents with  . Other    Rectal mass  . Constipation    HPI Brittney Johns is a 45 y.o. female here for evaluation of rectal mass. She reports she has been very constipated and has been having dark stools. This is reported as ongoing starting about 3 weeks ago. She was seen at Urgent Care twice for this and had an abdominal xray done that showed stool in the colon. She has recently changed her diet and the constipation has improved in the last week. She was seen by her PCP on April 6th and she was told she may have a rectal mass, stool guaiac was negative. She reports a history of irritable bowel symdrome and did have a colonoscopy done in early 1990 at [facility].  I have reviewed the history of present illness with the patient.  HPI  Past Medical History  Diagnosis Date  . Migraine   . Depression with anxiety   . Interstitial cystitis   . Endometriosis   . Migraine   . Depression   . Fatigue   . Allergy   . IBS (irritable bowel syndrome)   . Pre-diabetes 05/2015  . Hypertension     Past Surgical History  Procedure Laterality Date  . Facial surgey      open bite wound  . Ganglion cyst excision    . Tubal ligation    . Abdominal hysterectomy      complete.  . Bilateral oophorectomy  June 2015    Family History  Problem Relation Age of Onset  . Aneurysm Father   . Dementia Mother   . Hypertension Mother   . Kidney disease Mother   . Renal Disease    . Hypertension Brother   . Migraines Son     Social History Social History  Substance Use Topics  . Smoking status: Never Smoker   . Smokeless tobacco: Never Used  . Alcohol Use: No    Allergies  Allergen Reactions  . Imitrex [Sumatriptan] Anaphylaxis    Current Outpatient Prescriptions  Medication Sig Dispense Refill  . albuterol (PROVENTIL HFA;VENTOLIN HFA) 108 (90 BASE) MCG/ACT inhaler Inhale 2 puffs  into the lungs every 6 (six) hours as needed for wheezing or shortness of breath.    Marland Kitchen aspirin 81 MG chewable tablet Chew 81 mg by mouth daily.    . B Complex Vitamins (B COMPLEX PO) Take by mouth daily.    Marland Kitchen BIOTIN PO Take by mouth daily.    . citalopram (CELEXA) 40 MG tablet Take 1 tablet (40 mg total) by mouth daily. 90 tablet 1  . Coenzyme Q10 (CO Q 10 PO) Take by mouth daily.    . Cyanocobalamin (VITAMIN B-12 PO) Take by mouth.    . estradiol (ESTRACE) 1 MG tablet TAKE 1 AND 1/2 (ONE AND A HALF) TABLET BY MOUTH DAILY 45 tablet 6  . fluticasone (FLONASE) 50 MCG/ACT nasal spray Place 2 sprays into both nostrils daily. 16 g 11  . imipramine (TOFRANIL) 25 MG tablet Take 25 mg by mouth at bedtime.    . Multiple Vitamin (MULTIVITAMIN) tablet Take 1 tablet by mouth daily.    . Omega-3 Fatty Acids (FISH OIL PO) Take by mouth daily.    Marland Kitchen topiramate (TOPAMAX) 100 MG tablet TAKE 1 TABLET (100 MG TOTAL) BY MOUTH DAILY. 30 tablet 11  . verapamil (CALAN) 80 MG  tablet TAKE 1 TABLET (80 MG TOTAL) BY MOUTH 2 (TWO) TIMES DAILY. 60 tablet 11  . polyethylene glycol powder (GLYCOLAX/MIRALAX) powder 255 grams one bottle for colonoscopy prep 255 g 0   No current facility-administered medications for this visit.    Review of Systems Review of Systems  Constitutional: Negative.   Respiratory: Negative.   Cardiovascular: Negative.   Gastrointestinal: Positive for abdominal pain and constipation. Negative for nausea, vomiting, diarrhea, blood in stool, abdominal distention, anal bleeding and rectal pain.    Blood pressure 110/70, pulse 79, resp. rate 13, height 5\' 7"  (1.702 m), weight 197 lb (89.359 kg).  Physical Exam Physical Exam  Constitutional: She is oriented to person, place, and time. She appears well-developed and well-nourished.  Eyes: Conjunctivae are normal. No scleral icterus.  Neck: Neck supple.  Cardiovascular: Normal rate, regular rhythm and normal heart sounds.   Pulmonary/Chest: Effort  normal and breath sounds normal.  Abdominal: Soft. Bowel sounds are normal.  Genitourinary: Rectal exam shows no external hemorrhoid, no internal hemorrhoid, no fissure, no mass, no tenderness and anal tone normal.  Digital rectal exam showed no palpable mass. No stool for guaiac.  Lymphadenopathy:    She has no cervical adenopathy.  Neurological: She is alert and oriented to person, place, and time.  Skin: Skin is warm and dry.  Psychiatric: She has a normal mood and affect.    Data Reviewed pcp notes  Assessment    History suggestive of chronic constipation. No apparent findings on exam.  Colonoscopy is reasonable. Advised on using miralax daily, plenty of fluid to drink, mild laxatives prn.      Plan   Colonoscopy with possible biopsy/polypectomy prn: Information regarding the procedure, including its potential risks and complications (including but not limited to perforation of the bowel, which may require emergency surgery to repair, and bleeding) was verbally given to the patient. Educational information regarding lower intestinal endoscopy was given to the patient. Written instructions for how to complete the bowel prep using Miralax were provided. The importance of drinking ample fluids to avoid dehydration as a result of the prep emphasized.  Patient has been scheduled for a colonoscopy on 01-26-16 at [facility].     PCP/Ref: Dr Baruch GoutyMelinda Lada This has been scribed by Sinda Duaryl-Lyn M Kennedy LPN    Gerlene BurdockSANKAR,SEEPLAPUTHUR G 01/03/2016, 1:21 PM

## 2015-12-31 DIAGNOSIS — J309 Allergic rhinitis, unspecified: Secondary | ICD-10-CM | POA: Insufficient documentation

## 2015-12-31 NOTE — Assessment & Plan Note (Signed)
Will increased stool burden proximal 2/3 of the colon; palpable rectal mass; refer to general surgeon, will need colonoscopy; water, fiber, miralax, check TSH

## 2015-12-31 NOTE — Assessment & Plan Note (Signed)
Refilled nasal corticosteroid 

## 2015-12-31 NOTE — Assessment & Plan Note (Signed)
With change in bowel habits, constipation; check CBC, refer to surgeon

## 2015-12-31 NOTE — Assessment & Plan Note (Signed)
Due for recheck A1c; weight loss, healthier eating habits, activity

## 2015-12-31 NOTE — Assessment & Plan Note (Signed)
Check CBC to r/o anemia which might suggest GI bleed; refer to surgeon

## 2016-01-03 ENCOUNTER — Encounter: Payer: Self-pay | Admitting: General Surgery

## 2016-01-10 ENCOUNTER — Telehealth: Payer: Self-pay | Admitting: *Deleted

## 2016-01-10 NOTE — Telephone Encounter (Signed)
Patient called back and was notified as instructed. She verbalizes understanding. 

## 2016-01-10 NOTE — Telephone Encounter (Signed)
No answer on home or cell number and unable to leave a message.   We need to make sure patient is aware to discontinue fish oil one week prior to colonoscopy.

## 2016-01-13 ENCOUNTER — Ambulatory Visit: Payer: BC Managed Care – PPO | Admitting: Family Medicine

## 2016-01-21 ENCOUNTER — Telehealth: Payer: Self-pay | Admitting: Family Medicine

## 2016-01-21 ENCOUNTER — Ambulatory Visit: Payer: BC Managed Care – PPO | Admitting: Family Medicine

## 2016-01-21 ENCOUNTER — Other Ambulatory Visit: Payer: Self-pay | Admitting: General Surgery

## 2016-01-21 NOTE — Telephone Encounter (Signed)
JUST FYI. PT SAID THAT DR Evette CristalSANKAR TOLD HER THAT HE DID NOT SEE WHAT YOU HAD SEEN AND SHE DID SCHEDULE TO HAVE COLONSCOPY BUT IS GOING TO CANCEL IT. HE TOLD HER THAT IF SHE WANTED TO THE COULD DO ONE FOR JUST KICKS AND GIGGLES. LOL. SAID THAT SHE IS TAKING THE MEXILAX AND DOING WHAT YOU SUGGESTED AND THAT HAS SEEMED TO HELP LOTS. HAD TO CANCEL APPT TODAY DUE TO WORK BUT IS CALLING BACK TO RESCHEDULE.

## 2016-01-25 ENCOUNTER — Encounter: Payer: Self-pay | Admitting: *Deleted

## 2016-01-26 ENCOUNTER — Encounter: Admission: RE | Payer: Self-pay | Source: Ambulatory Visit

## 2016-01-26 ENCOUNTER — Ambulatory Visit: Admission: RE | Admit: 2016-01-26 | Payer: BC Managed Care – PPO | Source: Ambulatory Visit | Admitting: General Surgery

## 2016-01-26 SURGERY — COLONOSCOPY WITH PROPOFOL
Anesthesia: General

## 2016-01-27 ENCOUNTER — Encounter: Payer: Self-pay | Admitting: *Deleted

## 2016-04-11 ENCOUNTER — Ambulatory Visit (INDEPENDENT_AMBULATORY_CARE_PROVIDER_SITE_OTHER): Payer: BC Managed Care – PPO | Admitting: Obstetrics and Gynecology

## 2016-04-11 ENCOUNTER — Encounter: Payer: Self-pay | Admitting: Obstetrics and Gynecology

## 2016-04-11 VITALS — BP 108/72 | HR 92 | Ht 67.0 in | Wt 190.4 lb

## 2016-04-11 DIAGNOSIS — R7309 Other abnormal glucose: Secondary | ICD-10-CM

## 2016-04-11 DIAGNOSIS — Z9079 Acquired absence of other genital organ(s): Secondary | ICD-10-CM | POA: Diagnosis not present

## 2016-04-11 DIAGNOSIS — Z90710 Acquired absence of both cervix and uterus: Secondary | ICD-10-CM | POA: Insufficient documentation

## 2016-04-11 DIAGNOSIS — N3010 Interstitial cystitis (chronic) without hematuria: Secondary | ICD-10-CM

## 2016-04-11 DIAGNOSIS — N958 Other specified menopausal and perimenopausal disorders: Secondary | ICD-10-CM

## 2016-04-11 DIAGNOSIS — F419 Anxiety disorder, unspecified: Secondary | ICD-10-CM | POA: Diagnosis not present

## 2016-04-11 DIAGNOSIS — Z90722 Acquired absence of ovaries, bilateral: Secondary | ICD-10-CM | POA: Diagnosis not present

## 2016-04-11 DIAGNOSIS — E8940 Asymptomatic postprocedural ovarian failure: Secondary | ICD-10-CM

## 2016-04-11 DIAGNOSIS — Z01419 Encounter for gynecological examination (general) (routine) without abnormal findings: Secondary | ICD-10-CM

## 2016-04-11 MED ORDER — ESTRADIOL 1 MG PO TABS: 1.5000 mg | ORAL_TABLET | Freq: Every day | ORAL | 6 refills | Status: DC

## 2016-04-11 MED ORDER — CITALOPRAM HYDROBROMIDE 40 MG PO TABS: ORAL_TABLET | ORAL | 1 refills | Status: DC

## 2016-04-11 NOTE — Progress Notes (Signed)
ANNUAL PREVENTATIVE CARE GYN  ENCOUNTER NOTE  Subjective:       Brittney Johns is a 45 y.o. G50P1001 female here for a routine annual gynecologic exam.  Current complaints: 1.  Discuss celexa- may want to d/c   History of endometriosis. Asymptomatic at this time. On hormone replacement therapy with estradiol 1.5 mg a day; asymptomatic Bowel function is normal. Irritative voiding symptoms persist with history of chronic interstitial cystitis. Currently she is only going once a night. Patient desires to decrease Celexa dosage.   Gynecologic History No LMP recorded. Patient has had a hysterectomy. Contraception: status post hysterectomy Last Pap: 2014 n/n. Results were: normal Last mammogram: 2015. Results were: normal  Obstetric History OB History  Gravida Para Term Preterm AB Living  1 1 1     1   SAB TAB Ectopic Multiple Live Births          1    # Outcome Date GA Lbr Len/2nd Weight Sex Delivery Anes PTL Lv  1 Term 1989    M Vag-Spont   LIV      Past Medical History:  Diagnosis Date  . Allergy   . Depression   . Depression with anxiety   . Endometriosis   . Fatigue   . Hypertension   . IBS (irritable bowel syndrome)   . Interstitial cystitis   . Migraine   . Migraine   . Pre-diabetes 05/2015    Past Surgical History:  Procedure Laterality Date  . ABDOMINAL HYSTERECTOMY     complete.  Marland Kitchen BILATERAL OOPHORECTOMY  June 2015  . facial surgey     open bite wound  . GANGLION CYST EXCISION    . TUBAL LIGATION      Current Outpatient Prescriptions on File Prior to Visit  Medication Sig Dispense Refill  . albuterol (PROVENTIL HFA;VENTOLIN HFA) 108 (90 BASE) MCG/ACT inhaler Inhale 2 puffs into the lungs every 6 (six) hours as needed for wheezing or shortness of breath.    . B Complex Vitamins (B COMPLEX PO) Take by mouth daily.    Marland Kitchen BIOTIN PO Take by mouth daily.    . citalopram (CELEXA) 40 MG tablet Take 1 tablet (40 mg total) by mouth daily. 90 tablet 1  .  Coenzyme Q10 (CO Q 10 PO) Take by mouth daily.    . Cyanocobalamin (VITAMIN B-12 PO) Take by mouth.    . estradiol (ESTRACE) 1 MG tablet TAKE 1 AND 1/2 (ONE AND A HALF) TABLET BY MOUTH DAILY 45 tablet 6  . fluticasone (FLONASE) 50 MCG/ACT nasal spray Place 2 sprays into both nostrils daily. 16 g 11  . imipramine (TOFRANIL) 25 MG tablet Take 25 mg by mouth at bedtime.    . Multiple Vitamin (MULTIVITAMIN) tablet Take 1 tablet by mouth daily.    . Omega-3 Fatty Acids (FISH OIL PO) Take by mouth daily.    . polyethylene glycol powder (GLYCOLAX/MIRALAX) powder 255 grams one bottle for colonoscopy prep 255 g 0  . topiramate (TOPAMAX) 100 MG tablet TAKE 1 TABLET (100 MG TOTAL) BY MOUTH DAILY. 30 tablet 11  . verapamil (CALAN) 80 MG tablet TAKE 1 TABLET (80 MG TOTAL) BY MOUTH 2 (TWO) TIMES DAILY. 60 tablet 11   No current facility-administered medications on file prior to visit.     Allergies  Allergen Reactions  . Imitrex [Sumatriptan] Anaphylaxis    Social History   Social History  . Marital status: Single    Spouse name: N/A  . Number  of children: 1  . Years of education: 12+   Occupational History  . Not on file.   Social History Main Topics  . Smoking status: Never Smoker  . Smokeless tobacco: Never Used  . Alcohol use No  . Drug use: No  . Sexual activity: Yes    Partners: Male   Other Topics Concern  . Not on file   Social History Narrative   Patient lives with her mother and son.   Patient works for [employer].          Family History  Problem Relation Age of Onset  . Aneurysm Father   . Dementia Mother   . Hypertension Mother   . Kidney disease Mother   . Renal Disease    . Hypertension Brother   . Migraines Son     The following portions of the patient's history were reviewed and updated as appropriate: allergies, current medications, past family history, past medical history, past social history, past surgical history and problem  list.  Review of Systems ROS Review of Systems - General ROS: negative for - chills, fatigue, fever, hot flashes, night sweats, weight gain or weight loss Psychological ROS: negative for - anxiety, decreased libido, depression, mood swings, physical abuse or sexual abuse Ophthalmic ROS: negative for - blurry vision, eye pain or loss of vision ENT ROS: negative for - headaches, hearing change, visual changes or vocal changes Allergy and Immunology ROS: negative for - hives, itchy/watery eyes or seasonal allergies Hematological and Lymphatic ROS: negative for - bleeding problems, bruising, swollen lymph nodes or weight loss Endocrine ROS: negative for - galactorrhea, hair pattern changes, hot flashes, malaise/lethargy, mood swings, palpitations, polydipsia/polyuria, skin changes, temperature intolerance or unexpected weight changes Breast ROS: negative for - new or changing breast lumps or nipple discharge Respiratory ROS: negative for - cough or shortness of breath Cardiovascular ROS: negative for - chest pain, irregular heartbeat, palpitations or shortness of breath Gastrointestinal ROS: no abdominal pain, change in bowel habits, or black or bloody stools Genito-Urinary ROS: no dysuria, trouble voiding, or hematuria Musculoskeletal ROS: negative for - joint pain or joint stiffness Neurological ROS: negative for - bowel and bladder control changes Dermatological ROS: negative for rash and skin lesion changes   Objective:   BP 108/72   Pulse 92   Ht 5\' 7"  (1.702 m)   Wt 190 lb 6.4 oz (86.4 kg)   BMI 29.82 kg/m  CONSTITUTIONAL: Well-developed, well-nourished female in no acute distress.  PSYCHIATRIC: Normal mood and affect. Normal behavior. Normal judgment and thought content. NEUROLGIC: Alert and oriented to person, place, and time. Normal muscle tone coordination. No cranial nerve deficit noted. HENT:  Normocephalic, atraumatic, External right and left ear normal. Oropharynx is clear  and moist EYES: Conjunctivae and EOM are normal.  No scleral icterus.  NECK: Normal range of motion, supple, no masses.  Normal thyroid.  SKIN: Skin is warm and dry. No rash noted. Not diaphoretic. No erythema. No pallor. CARDIOVASCULAR: Normal heart rate noted, regular rhythm, no murmur. RESPIRATORY: Clear to auscultation bilaterally. Effort and breath sounds normal, no problems with respiration noted. BREASTS: Symmetric in size. No masses, skin changes, nipple drainage, or lymphadenopathy. ABDOMEN: Soft, normal bowel sounds, no distention noted.  No tenderness, rebound or guarding.  BLADDER: Normal PELVIC:  External Genitalia: Normal  BUS: Normal  Vagina: Normal; vaginal cuff intact; no tenderness  Cervix: Surgically absent  Uterus: Surgically absent  Adnexa: Normal; nonpalpable and nontender  RV: External Exam NormaI,  No Rectal Masses and Normal Sphincter tone  MUSCULOSKELETAL: Normal range of motion. No tenderness.  No cyanosis, clubbing, or edema.  2+ distal pulses. LYMPHATIC: No Axillary, Supraclavicular, or Inguinal Adenopathy.    Assessment:   Annual gynecologic examination 45 y.o. Contraception: status post hysterectomy bmi 29 Problem List Items Addressed This Visit    None    Visit Diagnoses   None.     Plan:  Pap: Not needed Mammogram: Ordered Stool Guaiac Testing:  Not Indicated Labs: lipid vit d fbs a1c tsh Routine preventative health maintenance measures emphasized: Exercise/Diet/Weight control, Tobacco Warnings, Alcohol/Substance use risks and Stress Management Encourage calcium with vitamin D supplementation daily Decrease Celexa 20 mg a day; return in 6 months for follow-up Refill estradiol 1.5 mg a day Return to Clinic - 1 Year   [employer], CMA  Herold Harms, MD  Note: This dictation was prepared with Dragon dictation along with smaller phrase technology. Any transcriptional errors that result from this process are  unintentional.

## 2016-04-11 NOTE — Patient Instructions (Addendum)
1. No Pap smear needed 2. Mammogram ordered 3. Estradiol 1.5 mg daily [place] 4. Decreased Celexa 20 mg a day 5. Return in 6 months for follow-up on anxiety/depression 6. Return in 1 year for annual exam 7. Encourage calcium with vitamin D supplementation 1200 mg/800 international units daily 8. Screening labs are obtained today

## 2016-04-12 LAB — LIPID PANEL
CHOL/HDL RATIO: 1.8 ratio (ref 0.0–4.4)
CHOLESTEROL TOTAL: 175 mg/dL (ref 100–199)
HDL: 95 mg/dL (ref >39)
LDL CALC: 71 mg/dL (ref 0–99)
Triglycerides: 45 mg/dL (ref 0–149)
VLDL CHOLESTEROL CAL: 9 mg/dL (ref 5–40)

## 2016-04-12 LAB — HEMOGLOBIN A1C
Est. average glucose Bld gHb Est-mCnc: 111 mg/dL
Hgb A1c MFr Bld: 5.5 % (ref 4.8–5.6)

## 2016-04-12 LAB — TSH: TSH: 0.535 u[IU]/mL (ref 0.450–4.500)

## 2016-04-12 LAB — GLUCOSE, RANDOM: Glucose: 88 mg/dL (ref 65–99)

## 2016-04-12 LAB — VITAMIN D 25 HYDROXY (VIT D DEFICIENCY, FRACTURES): Vit D, 25-Hydroxy: 46.2 ng/mL (ref 30.0–100.0)

## 2016-04-26 ENCOUNTER — Ambulatory Visit
Admission: RE | Admit: 2016-04-26 | Discharge: 2016-04-26 | Disposition: A | Discharge status: Home / Self Care | Payer: BC Managed Care – PPO | Source: Ambulatory Visit | Attending: Obstetrics and Gynecology | Admitting: Obstetrics and Gynecology

## 2016-04-26 ENCOUNTER — Other Ambulatory Visit: Payer: Self-pay | Admitting: Obstetrics and Gynecology

## 2016-04-26 DIAGNOSIS — Z1231 Encounter for screening mammogram for malignant neoplasm of breast: Secondary | ICD-10-CM | POA: Insufficient documentation

## 2016-04-26 DIAGNOSIS — Z01419 Encounter for gynecological examination (general) (routine) without abnormal findings: Secondary | ICD-10-CM

## 2016-05-29 ENCOUNTER — Ambulatory Visit: Payer: BC Managed Care – PPO | Admitting: Family Medicine

## 2016-06-20 ENCOUNTER — Encounter: Payer: Self-pay | Admitting: Family Medicine

## 2016-06-20 ENCOUNTER — Ambulatory Visit (INDEPENDENT_AMBULATORY_CARE_PROVIDER_SITE_OTHER): Payer: BC Managed Care – PPO | Admitting: Family Medicine

## 2016-06-20 DIAGNOSIS — E6609 Other obesity due to excess calories: Secondary | ICD-10-CM | POA: Diagnosis not present

## 2016-06-20 DIAGNOSIS — R7301 Impaired fasting glucose: Secondary | ICD-10-CM | POA: Diagnosis not present

## 2016-06-20 DIAGNOSIS — Z6830 Body mass index (BMI) 30.0-30.9, adult: Secondary | ICD-10-CM | POA: Diagnosis not present

## 2016-06-20 DIAGNOSIS — N3010 Interstitial cystitis (chronic) without hematuria: Secondary | ICD-10-CM

## 2016-06-20 MED ORDER — LIRAGLUTIDE -WEIGHT MANAGEMENT 18 MG/3ML ~~LOC~~ SOPN: 0.6000 mg | PEN_INJECTOR | Freq: Every day | SUBCUTANEOUS | 0 refills | Status: DC

## 2016-06-20 NOTE — Patient Instructions (Addendum)
  Check out the information at familydoctor.org entitled "Nutrition for Weight Loss: What You Need to Know about Fad Diets" Try to lose between 1-2 pounds per week by taking in fewer calories and burning off more calories You can succeed by limiting portions, limiting foods dense in calories and fat, becoming more active, and drinking 8 glasses of water a day (64 ounces) Don't skip meals, especially breakfast, as skipping meals may alter your metabolism Do not use over-the-counter weight loss pills or gimmicks that claim rapid weight loss A healthy BMI (or body mass index) is between 18.5 and 24.9 You can calculate your ideal BMI at the NIH website JobEconomics.huhttp://www.nhlbi.nih.gov/health/educational/lose_wt/BMI/bmicalc.htm  Check out LimitLaws.com.cymyfitnesspal.com or other calorie counter Exercise for 150 minutes per week  Start the [place]axenda Stop it immediately and go to the ER if any upper abdominal pain Stop it and call me right away for any lumps in the neck  Okay to take vitamin B12 sublingual, 1000 or 2000 mcg daily

## 2016-06-20 NOTE — Assessment & Plan Note (Addendum)
BMI is >30 and we'll start Saxenda; discussed risks, benefits, alternatives; no fam hx of MEN-2; no hx of medullary thyroid carcinoma; reasons to stop immediately and seek care reviewed (abd pain suggestive of pancreatitis, lump in neck, etc.); return for f/u; continue to eat sensibly, be active, hydrate

## 2016-06-20 NOTE — Progress Notes (Signed)
BP 114/68   Pulse 98   Temp 98.1 F (36.7 C) (Oral)   Resp 16   Wt 195 lb (88.5 kg)   SpO2 96%   BMI 30.54 kg/m    Subjective:    Patient ID: Brittney Johns, female    DOB: 05/21/1971, 45 y.o.   MRN: [id number]  HPI: Brittney Johns is a 45 y.o. female  Chief Complaint  Patient presents with  . Allergic Rhinitis   . Obesity   Patient is here to consult about weight loss medication She had an A1c of 5.9 in April and it dropped to 5.6  Dr. Greggory KeeneFrancesco talked to her about being prediabetic, he talked to her about losing weight and eating right She saw Dr. Achilles Dunkope in August Her weight affects her interstitial cystitis She talked to both doctors and they said she needs to talk to me about Bernie CoveySaxenda She has read about it She has done some research on it She has been working with a nutritionist, they researched it together Lots of positive results  Normal TSH in July Normal lipids in July A1c had dropped, reviewed Lab Results  Component Value Date   HGBA1C 5.5 04/11/2016    Depression screen PHQ 2/9 06/20/2016  Decreased Interest 0  Down, Depressed, Hopeless 0  PHQ - 2 Score 0   Relevant past medical, surgical, family and social history reviewed Past Medical History:  Diagnosis Date  . Allergy   . Depression   . Depression with anxiety   . Endometriosis   . Fatigue   . Hypertension   . IBS (irritable bowel syndrome)   . Interstitial cystitis   . Migraine   . Migraine   . Pre-diabetes 05/2015   Past Surgical History:  Procedure Laterality Date  . ABDOMINAL HYSTERECTOMY     complete.  Marland Kitchen. BILATERAL OOPHORECTOMY  June 2015  . facial surgey     open bite wound  . GANGLION CYST EXCISION    . TUBAL LIGATION     Family History  Problem Relation Age of Onset  . Aneurysm Father   . Dementia Mother   . Hypertension Mother   . Kidney disease Mother   . Renal Disease    . Hypertension Brother   . Migraines Son    Social History  Substance Use Topics  .  Smoking status: Never Smoker  . Smokeless tobacco: Never Used  . Alcohol use No   Interim medical history since last visit reviewed. Allergies and medications reviewed  Review of Systems Per HPI unless specifically indicated above     Objective:    BP 114/68   Pulse 98   Temp 98.1 F (36.7 C) (Oral)   Resp 16   Wt 195 lb (88.5 kg)   SpO2 96%   BMI 30.54 kg/m   Wt Readings from Last 3 Encounters:  06/20/16 195 lb (88.5 kg)  04/11/16 190 lb 6.4 oz (86.4 kg)  12/29/15 197 lb (89.4 kg)    Physical Exam  Constitutional: She appears well-developed and well-nourished. No distress.  Eyes: EOM are normal. No scleral icterus.  Neck: No thyromegaly present.  Cardiovascular: Normal rate.   Pulmonary/Chest: Effort normal.  Abdominal: She exhibits no distension.  Skin: No pallor.  No hirsutism; no acne  Psychiatric: She has a normal mood and affect. Her behavior is normal. Judgment and thought content normal. Her mood appears not anxious. She does not exhibit a depressed mood.    Results for orders placed or  performed in visit on 04/11/16  Glucose, random  Result Value Ref Range   Glucose 88 65 - 99 mg/dL  Hemoglobin Z6X  Result Value Ref Range   Hgb A1c MFr Bld 5.5 4.8 - 5.6 %   Est. average glucose Bld gHb Est-mCnc 111 mg/dL  Lipid panel  Result Value Ref Range   Cholesterol, Total 175 100 - 199 mg/dL   Triglycerides 45 0 - 149 mg/dL   HDL 95 >09 mg/dL   VLDL Cholesterol Cal 9 5 - 40 mg/dL   LDL Calculated 71 0 - 99 mg/dL   Chol/HDL Ratio 1.8 0.0 - 4.4 ratio units  VITAMIN D 25 Hydroxy (Vit-D Deficiency, Fractures)  Result Value Ref Range   Vit D, 25-Hydroxy 46.2 30.0 - 100.0 ng/mL  TSH  Result Value Ref Range   TSH 0.535 0.450 - 4.500 uIU/mL      Assessment & Plan:   Problem List Items Addressed This Visit      Endocrine   Impaired fasting glucose (Chronic)    Previous A1c of 5.8, improved on last check; risk of developing diabetes in the future, so it's so  very important for her to get her weight under control; so glad she is working with nutritionist        Genitourinary   Chronic interstitial cystitis (Chronic)    Under the care of urologist, Dr. Achilles Dunk        Other   Obesity (Chronic)    BMI is >30 and we'll start Saxenda; discussed risks, benefits, alternatives; no fam hx of MEN-2; no hx of medullary thyroid carcinoma; reasons to stop immediately and seek care reviewed (abd pain suggestive of pancreatitis, lump in neck, etc.); return for f/u; continue to eat sensibly, be active, hydrate      Relevant Medications   Liraglutide -Weight Management (SAXENDA) 18 MG/3ML SOPN    Other Visit Diagnoses   None.     Follow up plan: Return in about 6 weeks (around 08/01/2016) for weight management.  An after-visit summary was printed and given to the patient at check-out.  Please see the patient instructions which may contain other information and recommendations beyond what is mentioned above in the assessment and plan.  Meds ordered this encounter  Medications  . Liraglutide -Weight Management (SAXENDA) 18 MG/3ML SOPN    Sig: Inject 0.6 mg into the skin daily. x 1 week, then 1.2 mg daily x 1 week, then 1.8 mg daily x 1 week, then 2.4 mg daily x 1 week, then 3 mg daily    Dispense:  15 mL    Refill:  0

## 2016-06-22 ENCOUNTER — Telehealth: Payer: Self-pay

## 2016-06-22 ENCOUNTER — Telehealth: Payer: Self-pay | Admitting: Family Medicine

## 2016-06-22 MED ORDER — INSULIN PEN NEEDLE 31G X 5 MM MISC: 1 refills | Status: DC

## 2016-06-22 NOTE — Telephone Encounter (Signed)
pharmacy needs you to fax rx for needles for the saxenda

## 2016-06-25 NOTE — Assessment & Plan Note (Signed)
Previous A1c of 5.8, improved on last check; risk of developing diabetes in the future, so it's so very important for her to get her weight under control; so glad she is working with nutritionist

## 2016-06-25 NOTE — Assessment & Plan Note (Signed)
Under the care of urologist, Dr. Achilles Dunkope

## 2016-06-27 NOTE — Telephone Encounter (Signed)
COMPLETED

## 2016-07-18 ENCOUNTER — Other Ambulatory Visit: Payer: Self-pay | Admitting: Family Medicine

## 2016-07-19 NOTE — Telephone Encounter (Signed)
rx approved; new instructions written

## 2016-07-27 ENCOUNTER — Encounter: Payer: Self-pay | Admitting: Emergency Medicine

## 2016-07-27 ENCOUNTER — Telehealth: Payer: Self-pay | Admitting: Family Medicine

## 2016-07-27 ENCOUNTER — Emergency Department
Admission: EM | Admit: 2016-07-27 | Discharge: 2016-07-27 | Disposition: A | Discharge status: Home / Self Care | Payer: BC Managed Care – PPO | Attending: Emergency Medicine | Admitting: Emergency Medicine

## 2016-07-27 DIAGNOSIS — I10 Essential (primary) hypertension: Secondary | ICD-10-CM | POA: Diagnosis not present

## 2016-07-27 DIAGNOSIS — R1084 Generalized abdominal pain: Secondary | ICD-10-CM

## 2016-07-27 DIAGNOSIS — R197 Diarrhea, unspecified: Secondary | ICD-10-CM | POA: Diagnosis not present

## 2016-07-27 DIAGNOSIS — R112 Nausea with vomiting, unspecified: Secondary | ICD-10-CM

## 2016-07-27 DIAGNOSIS — Z7951 Long term (current) use of inhaled steroids: Secondary | ICD-10-CM | POA: Insufficient documentation

## 2016-07-27 DIAGNOSIS — Z79899 Other long term (current) drug therapy: Secondary | ICD-10-CM | POA: Diagnosis not present

## 2016-07-27 LAB — URINALYSIS COMPLETE WITH MICROSCOPIC (ARMC ONLY)
Bilirubin Urine: NEGATIVE
GLUCOSE, UA: NEGATIVE mg/dL
Hgb urine dipstick: NEGATIVE
Leukocytes, UA: NEGATIVE
NITRITE: NEGATIVE
Protein, ur: NEGATIVE mg/dL
SPECIFIC GRAVITY, URINE: 1.024 (ref 1.005–1.030)
pH: 5 (ref 5.0–8.0)

## 2016-07-27 LAB — COMPREHENSIVE METABOLIC PANEL
ALBUMIN: 4.2 g/dL (ref 3.5–5.0)
ALK PHOS: 50 U/L (ref 38–126)
ALT: 18 U/L (ref 14–54)
AST: 29 U/L (ref 15–41)
Anion gap: 7 (ref 5–15)
BILIRUBIN TOTAL: 0.6 mg/dL (ref 0.3–1.2)
BUN: 13 mg/dL (ref 6–20)
CALCIUM: 8.8 mg/dL — AB (ref 8.9–10.3)
CO2: 23 mmol/L (ref 22–32)
Chloride: 108 mmol/L (ref 101–111)
Creatinine, Ser: 0.93 mg/dL (ref 0.44–1.00)
GFR calc Af Amer: >60 mL/min (ref >60)
GFR calc non Af Amer: >60 mL/min (ref >60)
GLUCOSE: 89 mg/dL (ref 65–99)
Potassium: 3.5 mmol/L (ref 3.5–5.1)
Sodium: 138 mmol/L (ref 135–145)
TOTAL PROTEIN: 7.9 g/dL (ref 6.5–8.1)

## 2016-07-27 LAB — CBC
HEMATOCRIT: 43.8 % (ref 35.0–47.0)
Hemoglobin: 14.7 g/dL (ref 12.0–16.0)
MCH: 31.2 pg (ref 26.0–34.0)
MCHC: 33.6 g/dL (ref 32.0–36.0)
MCV: 93.1 fL (ref 80.0–100.0)
Platelets: 287 10*3/uL (ref 150–440)
RBC: 4.71 MIL/uL (ref 3.80–5.20)
RDW: 13.3 % (ref 11.5–14.5)
WBC: 7.6 10*3/uL (ref 3.6–11.0)

## 2016-07-27 LAB — LIPASE, BLOOD: Lipase: 31 U/L (ref 11–51)

## 2016-07-27 MED ORDER — ONDANSETRON HCL 4 MG PO TABS: 4.0000 mg | ORAL_TABLET | Freq: Every day | ORAL | 0 refills | Status: DC | PRN

## 2016-07-27 MED ORDER — DICYCLOMINE HCL 10 MG PO CAPS
ORAL_CAPSULE | ORAL | Status: AC
  Administered 2016-07-27: 10 mg via ORAL
  Filled 2016-07-27: qty 2

## 2016-07-27 MED ORDER — SODIUM CHLORIDE 0.9 % IV BOLUS (SEPSIS)
1000.0000 mL | Freq: Once | INTRAVENOUS | Status: AC
  Administered 2016-07-27: 1000 mL via INTRAVENOUS

## 2016-07-27 MED ORDER — DICYCLOMINE HCL 20 MG PO TABS: 20.0000 mg | ORAL_TABLET | Freq: Three times a day (TID) | ORAL | 0 refills | Status: DC | PRN

## 2016-07-27 MED ORDER — DICYCLOMINE HCL 10 MG PO CAPS
10.0000 mg | ORAL_CAPSULE | Freq: Once | ORAL | Status: AC
  Administered 2016-07-27: 10 mg via ORAL

## 2016-07-27 MED ORDER — ONDANSETRON HCL 4 MG/2ML IJ SOLN
4.0000 mg | Freq: Once | INTRAMUSCULAR | Status: AC
  Administered 2016-07-27: 4 mg via INTRAVENOUS
  Filled 2016-07-27: qty 2

## 2016-07-27 NOTE — ED Notes (Signed)
Pt up to bathroom.

## 2016-07-27 NOTE — Telephone Encounter (Signed)
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

## 2016-07-27 NOTE — ED Provider Notes (Signed)
[facility] Emergency Department Provider Note  ____________________________________________   First MD Initiated Contact with Patient 07/27/16 [phone]     (approximate)  I have reviewed the triage vital signs and the nursing notes.   HISTORY  Chief Complaint Abdominal Pain and Emesis   HPI Brittney Johns is a 45 y.o. female history of hypertension and migraine who is presenting to the emergency department with 8 out of 10 lower abdominal pain as well as nausea vomiting and diarrhea. The patient is had multiple episodes of nausea vomiting and diarrhea ever since yesterday afternoon. She says this all started after work meeting. She denies any fever. Says that she does have chills. Says the pain is cramping. Denies any vaginal bleeding or discharge. Denies any burning with urination. Denies any blood in her vomit or diarrhea.  She is also concerned about being on a weight loss medication, Saxenda, which has known side effects of abdominal pain as well as diarrhea, nausea and vomiting. She says she has been taking it for 5 weeks and is supposed to be taking her last dose tomorrow.   Past Medical History:  Diagnosis Date  . Allergy   . Depression   . Depression with anxiety   . Endometriosis   . Fatigue   . Hypertension   . IBS (irritable bowel syndrome)   . Interstitial cystitis   . Migraine   . Migraine   . Pre-diabetes 05/2015    Patient Active Problem List   Diagnosis Date Noted  . Status post vaginal hysterectomy 04/11/2016  . Anxiety 04/11/2016  . Allergic rhinitis 12/31/2015  . Rectal mass 12/23/2015  . Dark stools 12/23/2015  . Constipation 12/23/2015  . Impaired fasting glucose 12/23/2015  . Obesity 07/21/2015  . Surgical menopause 04/07/2015  . Chronic right hip pain 04/05/2015  . Endometriosis   . Fatigue   . Allergy   . IBS (irritable bowel syndrome)   . Migraine without aura 10/17/2013  . Headache 10/17/2013  . FOM (frequency  of micturition) 04/24/2013  . Incomplete bladder emptying 04/24/2013  . Chronic interstitial cystitis 04/24/2013    Past Surgical History:  Procedure Laterality Date  . ABDOMINAL HYSTERECTOMY     complete.  Marland Kitchen BILATERAL OOPHORECTOMY  June 2015  . facial surgey     open bite wound  . GANGLION CYST EXCISION    . TUBAL LIGATION      Prior to Admission medications   Medication Sig Start Date End Date Taking? Authorizing Provider  albuterol (PROVENTIL HFA;VENTOLIN HFA) 108 (90 BASE) MCG/ACT inhaler Inhale 2 puffs into the lungs every 6 (six) hours as needed for wheezing or shortness of breath.   Yes Historical Provider, MD  B Complex Vitamins (B COMPLEX PO) Take by mouth daily.   Yes Historical Provider, MD  BIOTIN PO Take 1 tablet by mouth daily.    Yes Historical Provider, MD  Coenzyme Q10 (CO Q 10 PO) Take 1 tablet by mouth daily.    Yes Historical Provider, MD  Cyanocobalamin (VITAMIN B-12 PO) Take 1 tablet by mouth daily.    Yes Historical Provider, MD  estradiol (ESTRACE) 1 MG tablet Take 1.5 tablets (1.5 mg total) by mouth daily. 04/11/16  Yes Prentice Docker Defrancesco, MD  fluticasone (FLONASE) 50 MCG/ACT nasal spray Place 2 sprays into both nostrils daily. 12/23/15  Yes Kerman Passey, MD  imipramine (TOFRANIL) 25 MG tablet Take 25 mg by mouth at bedtime.   Yes Historical Provider, MD  Liraglutide -Weight  Management (SAXENDA) 18 MG/3ML SOPN Inject 3 mg into the skin See admin instructions. see notes 07/19/16  Yes Kerman PasseyMelinda P Lada, MD  Multiple Vitamin (MULTIVITAMIN) tablet Take 1 tablet by mouth daily.   Yes Historical Provider, MD  Omega-3 Fatty Acids (FISH OIL PO) Take by mouth daily.   Yes Historical Provider, MD  topiramate (TOPAMAX) 100 MG tablet TAKE 1 TABLET (100 MG TOTAL) BY MOUTH DAILY. 11/16/15  Yes Nilda RiggsNancy Carolyn Martin, NP  verapamil (CALAN) 80 MG tablet TAKE 1 TABLET (80 MG TOTAL) BY MOUTH 2 (TWO) TIMES DAILY. 11/16/15  Yes Nilda RiggsNancy Carolyn Martin, NP  Insulin Pen Needle 31G X 5 MM MISC  For use with Saxenda; use daily 06/22/16   Kerman PasseyMelinda P Lada, MD    Allergies Imitrex [sumatriptan]  Family History  Problem Relation Age of Onset  . Aneurysm Father   . Dementia Mother   . Hypertension Mother   . Kidney disease Mother   . Renal Disease    . Hypertension Brother   . Migraines Son     Social History Social History  Substance Use Topics  . Smoking status: Never Smoker  . Smokeless tobacco: Never Used  . Alcohol use No    Review of Systems Constitutional: chills Eyes: No visual changes. ENT: No sore throat. Cardiovascular: Denies chest pain. Respiratory: Denies shortness of breath. Gastrointestinal:   No constipation. Genitourinary: Negative for dysuria. Musculoskeletal: Negative for back pain. Skin: Negative for rash. Neurological: Negative for headaches, focal weakness or numbness.  10-point ROS otherwise negative.  ____________________________________________   PHYSICAL EXAM:  VITAL SIGNS: ED Triage Vitals  Enc Vitals Group     BP 07/27/16 0744 107/80     Pulse --      Resp --      Temp --      Temp src --      SpO2 --      Weight 07/27/16 0640 180 lb (81.6 kg)     Height 07/27/16 0640 5\' 7"  (1.702 m)     Head Circumference --      Peak Flow --      Pain Score 07/27/16 0641 8     Pain Loc --      Pain Edu? --      Excl. in GC? --     Constitutional: Alert and oriented. Well appearing and in no acute distress. Eyes: Conjunctivae are normal. PERRL. EOMI. Head: Atraumatic. Nose: No congestion/rhinnorhea. Mouth/Throat: Mucous membranes are moist.   Neck: No stridor.   Cardiovascular: Normal rate, regular rhythm. Grossly normal heart sounds.  Good peripheral circulation. Respiratory: Normal respiratory effort.  No retractions. Lungs CTAB. Gastrointestinal: Soft with mild and diffuse tenderness palpation. There is no rigidity. No guarding. No distention. No CVA tenderness. Musculoskeletal: No lower extremity tenderness nor edema.  No  joint effusions. Neurologic:  Normal speech and language. No gross focal neurologic deficits are appreciated.  Skin:  Skin is warm, dry and intact. No rash noted. Psychiatric: Mood and affect are normal. Speech and behavior are normal.  ____________________________________________   LABS (all labs ordered are listed, but only abnormal results are displayed)  Labs Reviewed  COMPREHENSIVE METABOLIC PANEL - Abnormal; Notable for the following:       Result Value   Calcium 8.8 (*)    All other components within normal limits  URINALYSIS COMPLETEWITH MICROSCOPIC ([facility] ONLY) - Abnormal; Notable for the following:    Color, Urine AMBER (*)    APPearance HAZY (*)  Ketones, ur 1+ (*)    Bacteria, UA RARE (*)    Squamous Epithelial / LPF 6-30 (*)    All other components within normal limits  LIPASE, BLOOD  CBC   ____________________________________________  EKG   ____________________________________________  RADIOLOGY   ____________________________________________   PROCEDURES  Procedure(s) performed:   Procedures  Critical Care performed:   ____________________________________________   INITIAL IMPRESSION / ASSESSMENT AND PLAN / ED COURSE  Pertinent labs & imaging results that were available during my care of the patient were reviewed by me and considered in my medical decision making (see chart for details).  ----------------------------------------- 9:46 AM on 07/27/2016 -----------------------------------------  Patient able to tolerate in her elbow had another episode of diarrhea. Reexamined her abdomen and it is soft with only minimal tenderness. This is an unchanged exam. She says that her pain has decreased now from an 8 to a 4. We'll discharge with Zofran and Bentyl. We discussed her lab results as well as reasons to return to the emergency department. She is understanding of the plan and willing to comply.  Clinical Course       ____________________________________________   FINAL CLINICAL IMPRESSION(S) / ED DIAGNOSES  Generalized abdominal pain with nausea vomiting and diarrhea.    NEW MEDICATIONS STARTED DURING THIS VISIT:  New Prescriptions   No medications on file     Note:  This document was prepared using Dragon voice recognition software and may include unintentional dictation errors.    Myrna Blazeravid Matthew Riverlyn Kizziah, MD 07/27/16 [phone]

## 2016-07-27 NOTE — Telephone Encounter (Signed)
Patient called states went to ER for nausea vomiting and diarrhea.  Th ER doctor said it could be a virus or the saxenda and she just wanted to notify you of this.

## 2016-07-27 NOTE — ED Triage Notes (Signed)
Patient ambulatory to triage with steady gait, without difficulty or distress noted; pt reports N/V/D and abd pain since yesterday

## 2016-07-27 NOTE — Telephone Encounter (Signed)
Pt is at the ER and would like a call back.

## 2016-07-27 NOTE — ED Notes (Signed)
MD to bedside.

## 2016-07-31 ENCOUNTER — Ambulatory Visit (INDEPENDENT_AMBULATORY_CARE_PROVIDER_SITE_OTHER): Payer: BC Managed Care – PPO | Admitting: Family Medicine

## 2016-07-31 ENCOUNTER — Telehealth: Payer: Self-pay | Admitting: Family Medicine

## 2016-07-31 ENCOUNTER — Encounter: Payer: Self-pay | Admitting: Family Medicine

## 2016-07-31 VITALS — BP 104/64 | HR 98 | Temp 98.3°F | Resp 14 | Wt 186.0 lb

## 2016-07-31 DIAGNOSIS — A09 Infectious gastroenteritis and colitis, unspecified: Secondary | ICD-10-CM

## 2016-07-31 DIAGNOSIS — E663 Overweight: Secondary | ICD-10-CM

## 2016-07-31 DIAGNOSIS — Z23 Encounter for immunization: Secondary | ICD-10-CM

## 2016-07-31 MED ORDER — SAXENDA 18 MG/3ML ~~LOC~~ SOPN: 0.6000 mg | PEN_INJECTOR | Freq: Every day | SUBCUTANEOUS | 0 refills | Status: DC

## 2016-07-31 NOTE — Patient Instructions (Addendum)
Start back at the bottom of the Saxenda dose and taper up each week to a max dose of 1.8 mg daily If you develop ANY abdominal pain, nausea, etc, then stop immediately and seek medical attention Colace would be a safe alternative to Miralax Stress reduction and regular walking

## 2016-07-31 NOTE — Assessment & Plan Note (Signed)
Start back on Saxenda but use at lower dose, titrate back up from scratch 0.6 mg daily x 1 week, then 1.2 mg daily x 1 week, then 1.8 mg daily and hold there; if any abd pain, nausea, etc, then stop med and seek medical attention immediately

## 2016-07-31 NOTE — Telephone Encounter (Signed)
errenous °

## 2016-07-31 NOTE — Progress Notes (Signed)
BP 104/64   Pulse 98   Temp 98.3 F (36.8 C) (Oral)   Resp 14   Wt 186 lb (84.4 kg)   SpO2 98%   BMI 29.13 kg/m    Subjective:    Patient ID: Brittney Johns, female    DOB: 12/09/1970, 45 y.o.   MRN: [id number]  HPI: Brittney Johns is a 45 y.o. female  Chief Complaint  Patient presents with  . Follow-up   Patient is here for f/u; she got sick, went to the ER; had labs done; Saxenda was stopped as a precautions She does not think it was the shot after all; she thinks she caught a virus No sickness since Saturday No fevers; no blood in the stool Her school had the salmonella outbreak Her mother had been in the hospital and she was out with her Worked Tues and Wed, so she doesn't think she caught salmonella She thinks it was just a stomach bug She lost 15-16 pounds in a month, so maybe the Saxenda was too strong Activity level; she is walking every day for 30 minutes, safe place; uses fit bit to help track She is cutting out sugars and excess carbs; trying to follow MyPlate kinds of things, nothing crazy, just additional sugar and sweets Staying hydrated Using miralax Always had problems with constipation for years for IBS; she'll be fine and it comes back; eats fiber, fruits  [facility] 07/31/2016 06/20/2016  Decreased Interest 0 0  Down, Depressed, Hopeless 0 0  PHQ - 2 Score 0 0   Relevant past medical, surgical, family and social history reviewed Past Medical History:  Diagnosis Date  . Allergy   . Depression   . Depression with anxiety   . Endometriosis   . Fatigue   . Hypertension   . IBS (irritable bowel syndrome)   . Interstitial cystitis   . Migraine   . Migraine   . Pre-diabetes 05/2015   Past Surgical History:  Procedure Laterality Date  . ABDOMINAL HYSTERECTOMY     complete.  Marland Kitchen. BILATERAL OOPHORECTOMY  June 2015  . facial surgey     open bite wound  . GANGLION CYST EXCISION    . TUBAL LIGATION     Family History  Problem  Relation Age of Onset  . Aneurysm Father   . Dementia Mother   . Hypertension Mother   . Kidney disease Mother   . Renal Disease    . Hypertension Brother   . Migraines Son    Social History  Substance Use Topics  . Smoking status: Never Smoker  . Smokeless tobacco: Never Used  . Alcohol use No   Interim medical history since last visit reviewed. Allergies and medications reviewed  Review of Systems Per HPI unless specifically indicated above     Objective:    BP 104/64   Pulse 98   Temp 98.3 F (36.8 C) (Oral)   Resp 14   Wt 186 lb (84.4 kg)   SpO2 98%   BMI 29.13 kg/m   Wt Readings from Last 3 Encounters:  07/31/16 186 lb (84.4 kg)  07/27/16 180 lb (81.6 kg)  06/20/16 195 lb (88.5 kg)    Physical Exam  Constitutional: She appears well-developed and well-nourished. No distress.  Eyes: EOM are normal. No scleral icterus.  Neck: No thyromegaly present.  Cardiovascular: Normal rate and regular rhythm.   Pulmonary/Chest: Effort normal and breath sounds normal.  Abdominal: Soft. Bowel sounds are normal. She  exhibits no distension. There is no tenderness. There is no guarding.  Musculoskeletal: She exhibits no edema.  Neurological: She is alert.  Skin: No rash noted. No pallor.  No hirsutism; no acne  Psychiatric: She has a normal mood and affect. Her behavior is normal. Judgment and thought content normal. Her mood appears not anxious. She does not exhibit a depressed mood.   Results for orders placed or performed during the hospital encounter of 07/27/16  Lipase, blood  Result Value Ref Range   Lipase 31 11 - 51 U/L  Comprehensive metabolic panel  Result Value Ref Range   Sodium 138 135 - 145 mmol/L   Potassium 3.5 3.5 - 5.1 mmol/L   Chloride 108 101 - 111 mmol/L   CO2 23 22 - 32 mmol/L   Glucose, Bld 89 65 - 99 mg/dL   BUN 13 6 - 20 mg/dL   Creatinine, Ser 1.61 0.44 - 1.00 mg/dL   Calcium 8.8 (L) 8.9 - 10.3 mg/dL   Total Protein 7.9 6.5 - 8.1 g/dL    Albumin 4.2 3.5 - 5.0 g/dL   AST 29 15 - 41 U/L   ALT 18 14 - 54 U/L   Alkaline Phosphatase 50 38 - 126 U/L   Total Bilirubin 0.6 0.3 - 1.2 mg/dL   GFR calc non Af Amer >60 >60 mL/min   GFR calc Af Amer >60 >60 mL/min   Anion gap 7 5 - 15  CBC  Result Value Ref Range   WBC 7.6 3.6 - 11.0 K/uL   RBC 4.71 3.80 - 5.20 MIL/uL   Hemoglobin 14.7 12.0 - 16.0 g/dL   HCT 09.6 04.5 - 40.9 %   MCV 93.1 80.0 - 100.0 fL   MCH 31.2 26.0 - 34.0 pg   MCHC 33.6 32.0 - 36.0 g/dL   RDW 81.1 91.4 - 78.2 %   Platelets 287 150 - 440 K/uL  Urinalysis complete, with microscopic  Result Value Ref Range   Color, Urine AMBER (A) YELLOW   APPearance HAZY (A) CLEAR   Glucose, UA NEGATIVE NEGATIVE mg/dL   Bilirubin Urine NEGATIVE NEGATIVE   Ketones, ur 1+ (A) NEGATIVE mg/dL   Specific Gravity, Urine 1.024 1.005 - 1.030   Hgb urine dipstick NEGATIVE NEGATIVE   pH 5.0 5.0 - 8.0   Protein, ur NEGATIVE NEGATIVE mg/dL   Nitrite NEGATIVE NEGATIVE   Leukocytes, UA NEGATIVE NEGATIVE   RBC / HPF 6-30 0 - 5 RBC/hpf   WBC, UA 0-5 0 - 5 WBC/hpf   Bacteria, UA RARE (A) NONE SEEN   Squamous Epithelial / LPF 6-30 (A) NONE SEEN   Mucous PRESENT       Assessment & Plan:   Problem List Items Addressed This Visit      Other   Overweight (BMI 25.0-29.9)    Start back on Saxenda but use at lower dose, titrate back up from scratch 0.6 mg daily x 1 week, then 1.2 mg daily x 1 week, then 1.8 mg daily and hold there; if any abd pain, nausea, etc, then stop med and seek medical attention immediately       Other Visit Diagnoses    Diarrhea, infectious, adult    -  Primary   suspect viral etiology; resolved; may return to work without restrictions   Needs flu shot       Relevant Orders   Flu Vaccine QUAD 36+ mos PF IM (Fluarix & Fluzone Quad PF) (Completed)      Follow  up plan: Return in about 4 weeks (around 08/28/2016) for weight management.  An after-visit summary was printed and given to the patient at  check-out.  Please see the patient instructions which may contain other information and recommendations beyond what is mentioned above in the assessment and plan.  Meds ordered this encounter  Medications  . DISCONTD: SAXENDA 18 MG/3ML SOPN  . SAXENDA 18 MG/3ML SOPN    Sig: Inject 0.6 mg into the skin daily. For 1 week, then 1.2 mg daily x 1 week, then 1.8 mg daily    Dispense:  5 pen    Refill:  0   Orders Placed This Encounter  Procedures  . Flu Vaccine QUAD 36+ mos PF IM (Fluarix & Fluzone Quad PF)

## 2016-08-01 ENCOUNTER — Ambulatory Visit: Payer: BC Managed Care – PPO | Admitting: Family Medicine

## 2016-08-03 ENCOUNTER — Telehealth: Payer: Self-pay | Admitting: Family Medicine

## 2016-08-03 ENCOUNTER — Encounter: Payer: Self-pay | Admitting: Family Medicine

## 2016-08-03 MED ORDER — ONDANSETRON HCL 4 MG PO TABS: 4.0000 mg | ORAL_TABLET | Freq: Every day | ORAL | 0 refills | Status: AC | PRN

## 2016-08-03 MED ORDER — DICYCLOMINE HCL 20 MG PO TABS: 20.0000 mg | ORAL_TABLET | Freq: Three times a day (TID) | ORAL | 0 refills | Status: DC | PRN

## 2016-08-03 NOTE — Telephone Encounter (Signed)
Tell her to NOT use the Saxenda at all until her symptoms have completed cleared and she has been well for several day I'll send the meds as requested

## 2016-08-03 NOTE — Telephone Encounter (Signed)
Was seen on Monday. She is still experiencing vomiting and diarrhea. Asking for refill on Zofran and Bentyl. They both seemed to help her. Please send to [email or web address]. The symptoms started last night

## 2016-08-03 NOTE — Telephone Encounter (Signed)
Left detailed voicemail

## 2016-08-29 ENCOUNTER — Ambulatory Visit: Payer: BC Managed Care – PPO | Admitting: Family Medicine

## 2016-10-12 ENCOUNTER — Ambulatory Visit: Payer: BC Managed Care – PPO | Admitting: Obstetrics and Gynecology

## 2016-11-06 ENCOUNTER — Other Ambulatory Visit: Payer: Self-pay

## 2016-11-14 ENCOUNTER — Encounter: Payer: Self-pay | Admitting: Family Medicine

## 2016-11-14 ENCOUNTER — Ambulatory Visit (INDEPENDENT_AMBULATORY_CARE_PROVIDER_SITE_OTHER): Payer: BC Managed Care – PPO | Admitting: Family Medicine

## 2016-11-14 VITALS — BP 104/62 | HR 98 | Temp 98.0°F | Resp 14 | Wt 174.1 lb

## 2016-11-14 DIAGNOSIS — Z23 Encounter for immunization: Secondary | ICD-10-CM | POA: Diagnosis not present

## 2016-11-14 DIAGNOSIS — E663 Overweight: Secondary | ICD-10-CM | POA: Diagnosis not present

## 2016-11-14 DIAGNOSIS — R7301 Impaired fasting glucose: Secondary | ICD-10-CM | POA: Diagnosis not present

## 2016-11-14 MED ORDER — SAXENDA 18 MG/3ML ~~LOC~~ SOPN: 3.0000 mg | PEN_INJECTOR | Freq: Every day | SUBCUTANEOUS | 5 refills | Status: DC

## 2016-11-14 NOTE — Patient Instructions (Signed)
You received the vaccine to protect against tetanus and diphtheria and pertussis today; the tetanus and diphtheria portions will provide protection up to ten years, and the pertussis component will give you protection against whooping cough for life Keep up the great job

## 2016-11-14 NOTE — Progress Notes (Signed)
BP 104/62   Pulse 98   Temp 98 F (36.7 C) (Oral)   Resp 14   Wt 174 lb 1.6 oz (79 kg)   SpO2 96%   BMI 27.27 kg/m    Subjective:    Patient ID: Brittney Johns, female    DOB: 08/14/1971, 46 y.o.   MRN: [id number]  HPI: Brittney Johns is a 46 y.o. female  Chief Complaint  Patient presents with  . Weight Check  . Medication Refill   Patient is here for management of obesity (now overweight) She weighed 186 pounds in November, down 12 pounds today over the last 3-1/2 months She weighed 199 pounds in April Got up to 206 pounds, peak weight Was 200 pounds around August, start of school She is using the injection (Saxenda); had the stomach virus and stayed at low dose, then titrated back up to the 3 mg daily She could not do dairy as she titrated up; has done lactose free; used to drink a shake every morning, but slowly incorporate stuff Worked with nutritionist before the Saxenda; following food pyramid Watching portions Drinking water or natural flavors Eating healthier, cutting out snacking (except fruits and veggies) She is getting in 8-10k steps a day; going to gym 3x a week No abdominal pain and no jaundice She hopes to reach her goal and then maintain for awhile, then try to come off She would like to get down to 165 pounds  [facility]/14/2016 07/31/2016 06/20/2016  Decreased Interest 0 0 0  Down, Depressed, Hopeless 0 0 0  PHQ - 2 Score 0 0 0   Relevant past medical, surgical, family and social history reviewed Past Medical History:  Diagnosis Date  . Allergy   . Depression   . Depression with anxiety   . Endometriosis   . Fatigue   . Hypertension   . IBS (irritable bowel syndrome)   . Interstitial cystitis   . Migraine   . Migraine   . Pre-diabetes 05/2015   Family History  Problem Relation Age of Onset  . Aneurysm Father   . Dementia Mother   . Hypertension Mother   . Kidney disease Mother   . Arthritis Mother   . Renal  Disease    . Migraines Son   . Hypertension Sister   . Diabetes Sister   . Seizures Maternal Aunt   . Cancer Maternal Aunt     brain  . Stroke Maternal Grandmother   . COPD Brother   . Vision loss Brother   . Asthma Brother   . Hypertension Brother   . Diabetes Brother    Social History  Substance Use Topics  . Smoking status: Never Smoker  . Smokeless tobacco: Never Used  . Alcohol use No   Interim medical history since last visit reviewed. Allergies and medications reviewed  Review of Systems Per HPI unless specifically indicated above     Objective:    BP 104/62   Pulse 98   Temp 98 F (36.7 C) (Oral)   Resp 14   Wt 174 lb 1.6 oz (79 kg)   SpO2 96%   BMI 27.27 kg/m   Wt Readings from Last 3 Encounters:  11/14/16 174 lb 1.6 oz (79 kg)  07/31/16 186 lb (84.4 kg)  07/27/16 180 lb (81.6 kg)    Physical Exam  Constitutional: She appears well-developed and well-nourished.  Weight down 12 pounds over the last 3-1/2 months; overweight  HENT:  Mouth/Throat:  Mucous membranes are normal.  Eyes: EOM are normal. No scleral icterus.  Cardiovascular: Normal rate and regular rhythm.   Pulmonary/Chest: Effort normal and breath sounds normal.  Abdominal: Soft. She exhibits no distension. There is no tenderness.  Psychiatric: She has a normal mood and affect. Her behavior is normal.      Assessment & Plan:   Problem List Items Addressed This Visit      Endocrine   Impaired fasting glucose (Chronic)    Expect this to improve with her weight loss; plan to check a1c at next visit        Other   Overweight (BMI 25.0-29.9) - Primary    Patient doing well with Saxenda as weight loss agent; so proud of the healthy changes she has made with her eating, activity level; expect long-term success if she keeps up these healthy habits; will continue the Saxenda; refills provided; will see her back in 4 months       Other Visit Diagnoses    Need for vaccine for TD  (tetanus-diphtheria)       Relevant Orders   Td : Tetanus/diphtheria >7yo Preservative  free (Completed)      Follow up plan: Return in about 4 months (around 03/14/2017) for follow-up or just after.  An after-visit summary was printed and given to the patient at check-out.  Please see the patient instructions which may contain other information and recommendations beyond what is mentioned above in the assessment and plan.  Meds ordered this encounter  Medications  . SAXENDA 18 MG/3ML SOPN    Sig: Inject 3 mg into the skin daily.    Dispense:  5 pen    Refill:  5    Orders Placed This Encounter  Procedures  . Td : Tetanus/diphtheria >7yo Preservative  free

## 2016-11-14 NOTE — Assessment & Plan Note (Signed)
Expect this to improve with her weight loss; plan to check a1c at next visit

## 2016-11-14 NOTE — Assessment & Plan Note (Signed)
Patient doing well with Saxenda as weight loss agent; so proud of the healthy changes she has made with her eating, activity level; expect long-term success if she keeps up these healthy habits; will continue the Saxenda; refills provided; will see her back in 4 months

## 2016-11-16 ENCOUNTER — Ambulatory Visit: Payer: BC Managed Care – PPO | Admitting: Nurse Practitioner

## 2016-11-16 ENCOUNTER — Other Ambulatory Visit: Payer: Self-pay | Admitting: *Deleted

## 2016-11-16 MED ORDER — TOPIRAMATE 100 MG PO TABS: ORAL_TABLET | ORAL | 11 refills | Status: DC

## 2016-11-16 NOTE — Telephone Encounter (Signed)
Called pt mobile, she is a Engineer, siteschool teacher.  Rescheduled appt to 12-20-16 at 0845 (on spring break) this week.  Could not come today at earlier time.

## 2016-12-04 ENCOUNTER — Other Ambulatory Visit: Payer: Self-pay | Admitting: Nurse Practitioner

## 2016-12-20 ENCOUNTER — Encounter: Payer: Self-pay | Admitting: Nurse Practitioner

## 2016-12-20 ENCOUNTER — Ambulatory Visit (INDEPENDENT_AMBULATORY_CARE_PROVIDER_SITE_OTHER): Payer: BC Managed Care – PPO | Admitting: Nurse Practitioner

## 2016-12-20 VITALS — BP 108/73 | HR 93 | Wt 181.6 lb

## 2016-12-20 DIAGNOSIS — G43009 Migraine without aura, not intractable, without status migrainosus: Secondary | ICD-10-CM | POA: Diagnosis not present

## 2016-12-20 MED ORDER — VERAPAMIL HCL 80 MG PO TABS: ORAL_TABLET | ORAL | 3 refills | Status: DC

## 2016-12-20 MED ORDER — TOPIRAMATE 100 MG PO TABS: ORAL_TABLET | ORAL | 3 refills | Status: DC

## 2016-12-20 NOTE — Patient Instructions (Addendum)
Patient will continue Topamax as ordered will refill Will continue Verapamil as ordered will refill Call for increase in headaches Migraine tracker APP to record  F/U yearly and prn

## 2016-12-20 NOTE — Progress Notes (Signed)
[facility]  PATIENT: Brittney Johns DOB: Feb 23, 1971   REASON FOR VISIT: Follow-up for migraines HISTORY FROM: Patient    HISTORY OF PRESENT ILLNESS:Brittney Johns, 46 year old female returns for yearly followup. She has a long history of migraines which are in good control with a combination of verapamil and Topamax. Her insurance does not cover Frova. She is allergic to Imitrex. Her migraine triggers are stress and environmental allergies. She has had more sinus  headaches recently. She needs refills. She is a Chartered loss adjuster in second grade. She returns for reevaluation   HISTORY: of migraines. She is a previous pt of Dr. Thad Ranger. She is currently on verapamil and Topamax tolerating medications without side effects. She relates that she has had more tension headaches recently, her mother has dementia and recently diagnosed with poor kidney function and poor circulation. 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 She gets very little help from her siblings. She says that she is aware that her headaches are worse due to her additional stress and lack of sleep.    REVIEW OF SYSTEMS: Full 14 system review of systems performed and notable only for those listed, all others are neg:  Constitutional: neg  Cardiovascular: neg Ear/Nose/Throat: neg  Skin: neg Eyes: neg Respiratory: neg Gastroitestinal: neg  Hematology/Lymphatic: neg  Endocrine: neg Musculoskeletal:neg Allergy/Immunology: neg Neurological: neg Psychiatric: neg Sleep : neg   ALLERGIES: Allergies  Allergen Reactions  . Imitrex [Sumatriptan] Anaphylaxis    HOME MEDICATIONS: Outpatient Medications Prior to Visit  Medication Sig Dispense Refill  . albuterol (PROVENTIL HFA;VENTOLIN HFA) 108 (90 BASE) MCG/ACT inhaler Inhale 2 puffs into the lungs every 6 (six) hours as needed for wheezing or shortness of breath.    . B Complex Vitamins (B COMPLEX PO) Take by mouth daily.     Marland Kitchen BIOTIN PO Take 1 tablet by mouth daily.     . Coenzyme Q10 (CO Q 10 PO) Take 1 tablet by mouth daily.     . Cyanocobalamin (VITAMIN B-12 PO) Take 1 tablet by mouth daily.     Marland Kitchen estradiol (ESTRACE) 1 MG tablet Take 1.5 tablets (1.5 mg total) by mouth daily. 45 tablet 6  . fluticasone (FLONASE) 50 MCG/ACT nasal spray Place 2 sprays into both nostrils daily. 16 g 11  . Insulin Pen Needle 31G X 5 MM MISC For use with Saxenda; use daily 50 each 1  . Multiple Vitamin (MULTIVITAMIN) tablet Take 1 tablet by mouth daily.    . Omega-3 Fatty Acids (FISH OIL PO) Take by mouth daily.    Marland Kitchen SAXENDA 18 MG/3ML SOPN Inject 3 mg into the skin daily. 5 pen 5  . topiramate (TOPAMAX) 100 MG tablet TAKE 1 TABLET (100 MG TOTAL) BY MOUTH DAILY. 30 tablet 11  . verapamil (CALAN) 80 MG tablet TAKE 1 TABLET (80 MG TOTAL) BY MOUTH 2 (TWO) TIMES DAILY. 60 tablet 11   No facility-administered medications prior to visit.     PAST MEDICAL HISTORY: Past Medical History:  Diagnosis Date  . Allergy   . Depression   . Depression with anxiety   . Endometriosis   . Fatigue   . Hypertension   . IBS (irritable bowel syndrome)   . Interstitial cystitis   . Migraine   . Migraine   . Pre-diabetes 05/2015    PAST SURGICAL HISTORY: Past Surgical History:  Procedure Laterality Date  . ABDOMINAL HYSTERECTOMY     complete.  Marland Kitchen BILATERAL OOPHORECTOMY  June  2015  . facial surgey     open bite wound  . GANGLION CYST EXCISION    . TUBAL LIGATION      FAMILY HISTORY: Family History  Problem Relation Age of Onset  . Aneurysm Father   . Dementia Mother   . Hypertension Mother   . Kidney disease Mother   . Arthritis Mother   . Renal Disease    . Migraines Son   . Hypertension Sister   . Diabetes Sister   . Seizures Maternal Aunt   . Cancer Maternal Aunt     brain  . Stroke Maternal Grandmother   . COPD Brother   . Vision loss Brother   . Asthma Brother   . Hypertension Brother   . Diabetes Brother      SOCIAL HISTORY: Social History   Social History  . Marital status: Single    Spouse name: N/A  . Number of children: 1  . Years of education: 12+   Occupational History  . Not on file.   Social History Main Topics  . Smoking status: Never Smoker  . Smokeless tobacco: Never Used  . Alcohol use No  . Drug use: No  . Sexual activity: Yes    Partners: Male   Other Topics Concern  . Not on file   Social History Narrative   Patient lives with her mother and son.   Patient works for [employer].           PHYSICAL EXAM  Vitals:   12/20/16 0837  BP: 108/73  Pulse: 93  Weight: 181 lb 9.6 oz (82.4 kg)   Body mass index is 28.44 kg/m. Generalized: Well developed, in no acute distress   Neurological examination   Mentation: Alert oriented to time, place, history taking. Follows all commands speech and language fluent  Cranial nerve II-XII: Pupils were equal round reactive to light extraocular movements were full, visual field were full on confrontational test. Facial sensation and strength were normal. hearing was intact to finger rubbing bilaterally. Uvula tongue midline. head turning and shoulder shrug were normal and symmetric.Tongue protrusion into cheek strength was normal. Motor: normal bulk and tone, full strength in the BUE, BLE, No focal weakness Coordination: finger-nose-finger, heel-to-shin bilaterally, no dysmetria Reflexes: 1+ upper lower and symmetric  Gait and Station: Rising up from seated position without assistance, normal stance, moderate stride, good arm swing, smooth turning, able to perform tiptoe, and heel walking without difficulty. Tandem gait is steady DIAGNOSTIC DATA (LABS, IMAGING, TESTING)   ASSESSMENT AND PLAN  46 y.o. year old female  has a past medical history of Migraine; Depression with anxiety; Interstitial cystitis; Endometriosis; Migraine; Depression; Fatigue; Allergy; IBS (irritable bowel syndrome); and  Pre-diabetes (05/2015). here to follow-up for migraines which are in good control.   PLAN: Patient will continue Topamax as ordered will refill Will continue Verapamil as ordered will refill Call for increase in headaches Migraine tracker APP to record  F/U yearly and prn I spent 15 min in total face to face time with the patient more than 50% of which was spent counseling and coordination of care,  reviewing medications and discussing and reviewing the diagnosis of migraine and further treatment options. Patient is not interested in trying a different triptan.Cline Crock, [facility], APRN  [facility] [address] [place], [place] [address] [phone]

## 2017-01-02 ENCOUNTER — Other Ambulatory Visit: Payer: Self-pay | Admitting: Family Medicine

## 2017-01-02 ENCOUNTER — Other Ambulatory Visit: Payer: Self-pay | Admitting: Nurse Practitioner

## 2017-01-09 ENCOUNTER — Other Ambulatory Visit: Payer: Self-pay

## 2017-01-09 MED ORDER — ESTRADIOL 1 MG PO TABS: 1.5000 mg | ORAL_TABLET | Freq: Every day | ORAL | 1 refills | Status: DC

## 2017-01-15 ENCOUNTER — Other Ambulatory Visit: Payer: Self-pay

## 2017-01-15 MED ORDER — ESTRADIOL 1 MG PO TABS: 1.5000 mg | ORAL_TABLET | Freq: Every day | ORAL | 1 refills | Status: DC

## 2017-03-16 ENCOUNTER — Ambulatory Visit: Payer: BC Managed Care – PPO | Admitting: Family Medicine

## 2017-03-28 ENCOUNTER — Ambulatory Visit: Payer: BC Managed Care – PPO | Admitting: Family Medicine

## 2017-04-03 ENCOUNTER — Ambulatory Visit: Payer: BC Managed Care – PPO | Admitting: Family Medicine

## 2017-04-09 ENCOUNTER — Ambulatory Visit: Payer: BC Managed Care – PPO | Admitting: Podiatry

## 2017-04-12 ENCOUNTER — Ambulatory Visit (INDEPENDENT_AMBULATORY_CARE_PROVIDER_SITE_OTHER): Payer: BC Managed Care – PPO | Admitting: Obstetrics and Gynecology

## 2017-04-12 ENCOUNTER — Encounter: Payer: Self-pay | Admitting: Obstetrics and Gynecology

## 2017-04-12 VITALS — BP 109/74 | HR 93 | Ht 67.0 in | Wt 184.3 lb

## 2017-04-12 DIAGNOSIS — Z1231 Encounter for screening mammogram for malignant neoplasm of breast: Secondary | ICD-10-CM | POA: Diagnosis not present

## 2017-04-12 DIAGNOSIS — Z90722 Acquired absence of ovaries, bilateral: Secondary | ICD-10-CM | POA: Diagnosis not present

## 2017-04-12 DIAGNOSIS — Z90710 Acquired absence of both cervix and uterus: Secondary | ICD-10-CM

## 2017-04-12 DIAGNOSIS — R7309 Other abnormal glucose: Secondary | ICD-10-CM | POA: Diagnosis not present

## 2017-04-12 DIAGNOSIS — F419 Anxiety disorder, unspecified: Secondary | ICD-10-CM | POA: Diagnosis not present

## 2017-04-12 DIAGNOSIS — Z01419 Encounter for gynecological examination (general) (routine) without abnormal findings: Secondary | ICD-10-CM

## 2017-04-12 DIAGNOSIS — Z1239 Encounter for other screening for malignant neoplasm of breast: Secondary | ICD-10-CM

## 2017-04-12 DIAGNOSIS — Z9079 Acquired absence of other genital organ(s): Secondary | ICD-10-CM | POA: Diagnosis not present

## 2017-04-12 DIAGNOSIS — E8940 Asymptomatic postprocedural ovarian failure: Secondary | ICD-10-CM

## 2017-04-12 MED ORDER — ESTRADIOL 1 MG PO TABS
1.5000 mg | ORAL_TABLET | Freq: Every day | ORAL | 3 refills | Status: DC
Start: 2017-04-12 — End: 2018-04-30

## 2017-04-12 NOTE — Patient Instructions (Signed)
1. No Pap smear needed 2. Mammogram ordered 3. Screening labs are ordered 4. Continue with healthy eating and exercise 5. Continue calcium and vitamin D supplementation 6. Estradiol 1.5 mg daily is prescribed (rewritten) for 1 year 7. Return in 1 year for annual exam   Health Maintenance for Postmenopausal Women Menopause is a normal process in which your reproductive ability comes to an end. This process happens gradually over a span of months to years, usually between the ages of 37 and 2. Menopause is complete when you have missed 12 consecutive menstrual periods. It is important to talk with your health care provider about some of the most common conditions that affect postmenopausal women, such as heart disease, cancer, and bone loss (osteoporosis). Adopting a healthy lifestyle and getting preventive care can help to promote your health and wellness. Those actions can also lower your chances of developing some of these common conditions. What should I know about menopause? During menopause, you may experience a number of symptoms, such as:  Moderate-to-severe hot flashes.  Night sweats.  Decrease in sex drive.  Mood swings.  Headaches.  Tiredness.  Irritability.  Memory problems.  Insomnia.  Choosing to treat or not to treat menopausal changes is an individual decision that you make with your health care provider. What should I know about hormone replacement therapy and supplements? Hormone therapy products are effective for treating symptoms that are associated with menopause, such as hot flashes and night sweats. Hormone replacement carries certain risks, especially as you become older. If you are thinking about using estrogen or estrogen with progestin treatments, discuss the benefits and risks with your health care provider. What should I know about heart disease and stroke? Heart disease, heart attack, and stroke become more likely as you age. This may be due, in part,  to the hormonal changes that your body experiences during menopause. These can affect how your body processes dietary fats, triglycerides, and cholesterol. Heart attack and stroke are both medical emergencies. There are many things that you can do to help prevent heart disease and stroke:  Have your blood pressure checked at least every 1-2 years. High blood pressure causes heart disease and increases the risk of stroke.  If you are 24-85 years old, ask your health care provider if you should take aspirin to prevent a heart attack or a stroke.  Do not use any tobacco products, including cigarettes, chewing tobacco, or electronic cigarettes. If you need help quitting, ask your health care provider.  It is important to eat a healthy diet and maintain a healthy weight. ? Be sure to include plenty of vegetables, fruits, low-fat dairy products, and lean protein. ? Avoid eating foods that are high in solid fats, added sugars, or salt (sodium).  Get regular exercise. This is one of the most important things that you can do for your health. ? Try to exercise for at least 150 minutes each week. The type of exercise that you do should increase your heart rate and make you sweat. This is known as moderate-intensity exercise. ? Try to do strengthening exercises at least twice each week. Do these in addition to the moderate-intensity exercise.  Know your numbers.Ask your health care provider to check your cholesterol and your blood glucose. Continue to have your blood tested as directed by your health care provider.  What should I know about cancer screening? There are several types of cancer. Take the following steps to reduce your risk and to catch any cancer  development as early as possible. Breast Cancer  Practice breast self-awareness. ? This means understanding how your breasts normally appear and feel. ? It also means doing regular breast self-exams. Let your health care provider know about any  changes, no matter how small.  If you are 5 or older, have a clinician do a breast exam (clinical breast exam or CBE) every year. Depending on your age, family history, and medical history, it may be recommended that you also have a yearly breast X-ray (mammogram).  If you have a family history of breast cancer, talk with your health care provider about genetic screening.  If you are at high risk for breast cancer, talk with your health care provider about having an MRI and a mammogram every year.  Breast cancer (BRCA) gene test is recommended for women who have family members with BRCA-related cancers. Results of the assessment will determine the need for genetic counseling and BRCA1 and for BRCA2 testing. BRCA-related cancers include these types: ? Breast. This occurs in males or females. ? Ovarian. ? Tubal. This may also be called fallopian tube cancer. ? Cancer of the abdominal or pelvic lining (peritoneal cancer). ? Prostate. ? Pancreatic.  Cervical, Uterine, and Ovarian Cancer Your health care provider may recommend that you be screened regularly for cancer of the pelvic organs. These include your ovaries, uterus, and vagina. This screening involves a pelvic exam, which includes checking for microscopic changes to the surface of your cervix (Pap test).  For women ages 21-65, health care providers may recommend a pelvic exam and a Pap test every three years. For women ages 28-65, they may recommend the Pap test and pelvic exam, combined with testing for human papilloma virus (HPV), every five years. Some types of HPV increase your risk of cervical cancer. Testing for HPV may also be done on women of any age who have unclear Pap test results.  Other health care providers may not recommend any screening for nonpregnant women who are considered low risk for pelvic cancer and have no symptoms. Ask your health care provider if a screening pelvic exam is right for you.  If you have had past  treatment for cervical cancer or a condition that could lead to cancer, you need Pap tests and screening for cancer for at least 20 years after your treatment. If Pap tests have been discontinued for you, your risk factors (such as having a new sexual partner) need to be reassessed to determine if you should start having screenings again. Some women have medical problems that increase the chance of getting cervical cancer. In these cases, your health care provider may recommend that you have screening and Pap tests more often.  If you have a family history of uterine cancer or ovarian cancer, talk with your health care provider about genetic screening.  If you have vaginal bleeding after reaching menopause, tell your health care provider.  There are currently no reliable tests available to screen for ovarian cancer.  Lung Cancer Lung cancer screening is recommended for adults 39-14 years old who are at high risk for lung cancer because of a history of smoking. A yearly low-dose CT scan of the lungs is recommended if you:  Currently smoke.  Have a history of at least 30 pack-years of smoking and you currently smoke or have quit within the past 15 years. A pack-year is smoking an average of one pack of cigarettes per day for one year.  Yearly screening should:  Continue until it  has been 15 years since you quit.  Stop if you develop a health problem that would prevent you from having lung cancer treatment.  Colorectal Cancer  This type of cancer can be detected and can often be prevented.  Routine colorectal cancer screening usually begins at age 79 and continues through age 74.  If you have risk factors for colon cancer, your health care provider may recommend that you be screened at an earlier age.  If you have a family history of colorectal cancer, talk with your health care provider about genetic screening.  Your health care provider may also recommend using home test kits to check  for hidden blood in your stool.  A small camera at the end of a tube can be used to examine your colon directly (sigmoidoscopy or colonoscopy). This is done to check for the earliest forms of colorectal cancer.  Direct examination of the colon should be repeated every 5-10 years until age 61. However, if early forms of precancerous polyps or small growths are found or if you have a family history or genetic risk for colorectal cancer, you may need to be screened more often.  Skin Cancer  Check your skin from head to toe regularly.  Monitor any moles. Be sure to tell your health care provider: ? About any new moles or changes in moles, especially if there is a change in a mole's shape or color. ? If you have a mole that is larger than the size of a pencil eraser.  If any of your family members has a history of skin cancer, especially at a young age, talk with your health care provider about genetic screening.  Always use sunscreen. Apply sunscreen liberally and repeatedly throughout the day.  Whenever you are outside, protect yourself by wearing long sleeves, pants, a wide-brimmed hat, and sunglasses.  What should I know about osteoporosis? Osteoporosis is a condition in which bone destruction happens more quickly than new bone creation. After menopause, you may be at an increased risk for osteoporosis. To help prevent osteoporosis or the bone fractures that can happen because of osteoporosis, the following is recommended:  If you are 40-44 years old, get at least 1,000 mg of calcium and at least 600 mg of vitamin D per day.  If you are older than age 65 but younger than age 15, get at least 1,200 mg of calcium and at least 600 mg of vitamin D per day.  If you are older than age 56, get at least 1,200 mg of calcium and at least 800 mg of vitamin D per day.  Smoking and excessive alcohol intake increase the risk of osteoporosis. Eat foods that are rich in calcium and vitamin D, and do  weight-bearing exercises several times each week as directed by your health care provider. What should I know about how menopause affects my mental health? Depression may occur at any age, but it is more common as you become older. Common symptoms of depression include:  Low or sad mood.  Changes in sleep patterns.  Changes in appetite or eating patterns.  Feeling an overall lack of motivation or enjoyment of activities that you previously enjoyed.  Frequent crying spells.  Talk with your health care provider if you think that you are experiencing depression. What should I know about immunizations? It is important that you get and maintain your immunizations. These include:  Tetanus, diphtheria, and pertussis (Tdap) booster vaccine.  Influenza every year before the flu season begins.  Pneumonia vaccine.  Shingles vaccine.  Your health care provider may also recommend other immunizations. This information is not intended to replace advice given to you by your health care provider. Make sure you discuss any questions you have with your health care provider. Document Released: 10/27/2005 Document Revised: 03/24/2016 Document Reviewed: 06/08/2015 Elsevier Interactive Patient Education  2018 Elsevier Inc.  

## 2017-04-12 NOTE — Progress Notes (Signed)
ANNUAL PREVENTATIVE CARE GYN  ENCOUNTER NOTE  Subjective:       Brittney Johns is a 46 y.o. [id number] female here for a routine annual gynecologic exam.  Current complaints: 1. none  History of endometriosis. Asymptomatic at this time. On hormone replacement therapy with estradiol 1.5 mg a day; asymptomatic Bowel function is normal. Bladder  function is normal. No endometriosis pain.   Gynecologic History No LMP recorded. Patient has had a hysterectomy. Contraception: status post hysterectomy Last Pap: 2014 n/n. Results were: normal Last mammogram: 04/2016 birad 1. Results were: normal  Obstetric History OB History  Gravida Para Term Preterm AB Living  1 1 1     1   SAB TAB Ectopic Multiple Live Births          1    # Outcome Date GA Lbr Len/2nd Weight Sex Delivery Anes PTL Lv  1 Term 1989    M Vag-Spont   LIV      Past Medical History:  Diagnosis Date  . Allergy   . Depression   . Depression with anxiety   . Endometriosis   . Fatigue   . Hypertension   . IBS (irritable bowel syndrome)   . Interstitial cystitis   . Migraine   . Migraine   . Pre-diabetes 05/2015    Past Surgical History:  Procedure Laterality Date  . ABDOMINAL HYSTERECTOMY     complete.  Marland Kitchen. BILATERAL OOPHORECTOMY  June 2015  . facial surgey     open bite wound  . GANGLION CYST EXCISION    . TUBAL LIGATION      Current Outpatient Prescriptions on File Prior to Visit  Medication Sig Dispense Refill  . albuterol (PROVENTIL HFA;VENTOLIN HFA) 108 (90 BASE) MCG/ACT inhaler Inhale 2 puffs into the lungs every 6 (six) hours as needed for wheezing or shortness of breath.    . B Complex Vitamins (B COMPLEX PO) Take by mouth daily.    . B-D UF III MINI PEN NEEDLES 31G X 5 MM MISC FOR USE WITH SAXENDA USE DAILY 30 each 1  . BIOTIN PO Take 1 tablet by mouth daily.     . Coenzyme Q10 (CO Q 10 PO) Take 1 tablet by mouth daily.     . Cyanocobalamin (VITAMIN B-12 PO) Take 1 tablet by mouth daily.     Marland Kitchen.  estradiol (ESTRACE) 1 MG tablet Take 1.5 tablets (1.5 mg total) by mouth daily. 135 tablet 1  . fluticasone (FLONASE) 50 MCG/ACT nasal spray Place 2 sprays into both nostrils daily. 16 g 11  . Multiple Vitamin (MULTIVITAMIN) tablet Take 1 tablet by mouth daily.    . Omega-3 Fatty Acids (FISH OIL PO) Take by mouth daily.    Marland Kitchen. SAXENDA 18 MG/3ML SOPN Inject 3 mg into the skin daily. 5 pen 5  . topiramate (TOPAMAX) 100 MG tablet TAKE 1 TABLET (100 MG TOTAL) BY MOUTH DAILY. 90 tablet 3  . verapamil (CALAN) 80 MG tablet TAKE 1 TABLET (80 MG TOTAL) BY MOUTH 2 (TWO) TIMES DAILY. 180 tablet 3   No current facility-administered medications on file prior to visit.     Allergies  Allergen Reactions  . Imitrex [Sumatriptan] Anaphylaxis    Social History   Social History  . Marital status: Single    Spouse name: N/A  . Number of children: 1  . Years of education: 12+   Occupational History  . Not on file.   Social History Main Topics  . Smoking status:  Never Smoker  . Smokeless tobacco: Never Used  . Alcohol use No  . Drug use: No  . Sexual activity: Yes    Partners: Male   Other Topics Concern  . Not on file   Social History Narrative   Patient lives with her mother and son.   Patient works for [employer].          Family History  Problem Relation Age of Onset  . Aneurysm Father   . Dementia Mother   . Hypertension Mother   . Kidney disease Mother   . Arthritis Mother   . Renal Disease Unknown   . Migraines Son   . Hypertension Sister   . Diabetes Sister   . Seizures Maternal Aunt   . Cancer Maternal Aunt        brain  . Stroke Maternal Grandmother   . COPD Brother   . Vision loss Brother   . Asthma Brother   . Hypertension Brother   . Diabetes Brother     The following portions of the patient's history were reviewed and updated as appropriate: allergies, current medications, past family history, past medical history, past social history,  past surgical history and problem list.  Review of Systems Review of Systems  Constitutional: Negative.        No vasdomotor symptoms Undergoing weight loss treatment at present   HENT: Negative.   Eyes: Negative.   Respiratory: Negative.   Cardiovascular: Negative.   Gastrointestinal: Negative.   Genitourinary: Negative.   Musculoskeletal: Negative.   Skin: Negative.   Neurological: Negative.   Endo/Heme/Allergies: Negative.   Psychiatric/Behavioral: Negative.      Objective:   BP 109/74   Pulse 93   Ht 5\' 7"  (1.702 m)   Wt 184 lb 4.8 oz (83.6 kg)   BMI 28.87 kg/m  CONSTITUTIONAL: Well-developed, well-nourished female in no acute distress.  PSYCHIATRIC: Normal mood and affect. Normal behavior. Normal judgment and thought content. NEUROLGIC: Alert and oriented to person, place, and time. Normal muscle tone coordination. No cranial nerve deficit noted. HENT:  Normocephalic, atraumatic, External right and left ear normal. Oropharynx is clear and moist EYES: Conjunctivae and EOM are normal.  No scleral icterus.  NECK: Normal range of motion, supple, no masses.  Normal thyroid.  SKIN: Skin is warm and dry. No rash noted. Not diaphoretic. No erythema. No pallor. CARDIOVASCULAR: Normal heart rate noted, regular rhythm, no murmur. RESPIRATORY: Clear to auscultation bilaterally. Effort and breath sounds normal, no problems with respiration noted. BREASTS: Symmetric in size. No masses, skin changes, nipple drainage, or lymphadenopathy. ABDOMEN: Soft, normal bowel sounds, no distention noted.  No tenderness, rebound or guarding.  BLADDER: Normal PELVIC:  External Genitalia: Normal  BUS: Normal  Vagina: Normal; vaginal cuff intact; no tenderness  Cervix: Surgically absent  Uterus: Surgically absent  Adnexa: Normal; nonpalpable and nontender  RV: External Exam NormaI, No Rectal Masses and Normal Sphincter tone  MUSCULOSKELETAL: Normal range of motion. No tenderness.  No cyanosis,  clubbing, or edema.  2+ distal pulses. LYMPHATIC: No Axillary, Supraclavicular, or Inguinal Adenopathy.    Assessment:   Annual gynecologic examination 46 y.o. Contraception: status post hysterectomy bmi 28   Plan:  Pap: Not needed Mammogram: Ordered Stool Guaiac Testing:  Not Indicated Labs: lipid vit d fbs a1c tsh Routine preventative health maintenance measures emphasized: Exercise/Diet/Weight control, Tobacco Warnings, Alcohol/Substance use risks and Stress Management Encourage calcium with vitamin D supplementation daily Refill estradiol 1.5 mg a day Return to Clinic -  [place] Miller, CMA  Brayton Mars, MD   Note: This dictation was prepared with Dragon dictation along with smaller phrase technology. Any transcriptional errors that result from this process are unintentional.

## 2017-04-13 LAB — HEMOGLOBIN A1C
Est. average glucose Bld gHb Est-mCnc: 105 mg/dL
HEMOGLOBIN A1C: 5.3 % (ref 4.8–5.6)

## 2017-04-13 LAB — LIPID PANEL
CHOLESTEROL TOTAL: 183 mg/dL (ref 100–199)
Chol/HDL Ratio: 2.1 ratio (ref 0.0–4.4)
HDL: 87 mg/dL (ref >39)
LDL Calculated: 84 mg/dL (ref 0–99)
TRIGLYCERIDES: 61 mg/dL (ref 0–149)
VLDL Cholesterol Cal: 12 mg/dL (ref 5–40)

## 2017-04-13 LAB — TSH: TSH: 1.12 u[IU]/mL (ref 0.450–4.500)

## 2017-04-13 LAB — GLUCOSE, RANDOM: GLUCOSE: 74 mg/dL (ref 65–99)

## 2017-05-09 ENCOUNTER — Ambulatory Visit
Admission: RE | Admit: 2017-05-09 | Discharge: 2017-05-09 | Disposition: A | Discharge status: Home / Self Care | Payer: BC Managed Care – PPO | Source: Ambulatory Visit | Attending: Obstetrics and Gynecology | Admitting: Obstetrics and Gynecology

## 2017-05-09 DIAGNOSIS — Z1239 Encounter for other screening for malignant neoplasm of breast: Secondary | ICD-10-CM

## 2017-05-09 DIAGNOSIS — Z1231 Encounter for screening mammogram for malignant neoplasm of breast: Secondary | ICD-10-CM | POA: Diagnosis not present

## 2017-06-19 ENCOUNTER — Encounter: Payer: Self-pay | Admitting: Family Medicine

## 2017-06-19 ENCOUNTER — Ambulatory Visit (INDEPENDENT_AMBULATORY_CARE_PROVIDER_SITE_OTHER): Payer: BC Managed Care – PPO | Admitting: Family Medicine

## 2017-06-19 VITALS — BP 102/70 | HR 88 | Temp 98.0°F | Resp 14 | Wt 185.8 lb

## 2017-06-19 DIAGNOSIS — Z23 Encounter for immunization: Secondary | ICD-10-CM

## 2017-06-19 DIAGNOSIS — R350 Frequency of micturition: Secondary | ICD-10-CM | POA: Diagnosis not present

## 2017-06-19 DIAGNOSIS — N3010 Interstitial cystitis (chronic) without hematuria: Secondary | ICD-10-CM

## 2017-06-19 DIAGNOSIS — K5900 Constipation, unspecified: Secondary | ICD-10-CM

## 2017-06-19 LAB — POCT URINALYSIS DIPSTICK
Bilirubin, UA: NEGATIVE
Blood, UA: NEGATIVE
Glucose, UA: NEGATIVE
Ketones, UA: NEGATIVE
LEUKOCYTES UA: NEGATIVE
NITRITE UA: NEGATIVE
PH UA: 6 (ref 5.0–8.0)
PROTEIN UA: NEGATIVE
Spec Grav, UA: 1.015 (ref 1.010–1.025)
UROBILINOGEN UA: 0.2 U/dL

## 2017-06-19 MED ORDER — POLYETHYLENE GLYCOL 3350 17 GM/SCOOP PO POWD: 17.0000 g | Freq: Every day | ORAL | 11 refills | Status: DC

## 2017-06-19 MED ORDER — IMIPRAMINE HCL 10 MG PO TABS: 20.0000 mg | ORAL_TABLET | Freq: Every day | ORAL | 2 refills | Status: DC

## 2017-06-19 NOTE — Progress Notes (Signed)
BP 102/70   Pulse 88   Temp 98 F (36.7 C) (Oral)   Resp 14   Wt 185 lb 12.8 oz (84.3 kg)   SpO2 97%   BMI 29.10 kg/m    Subjective:    Patient ID: Brittney Johns, female    DOB: 12-05-1970, 46 y.o.   MRN: [id number]  HPI: Brittney Johns is a 46 y.o. female  Chief Complaint  Patient presents with  . Follow-up    Pt called her urologist and wasnt able to get in untill end of feb and was asked to get apt with Dr. Abhiram Criado  . Urinary Frequency    every two hours. Happen since the summer.  more leakage over night. bladder was intack 4- 5 years ago and pt is wondering  if it went undone. pt denies any symptoms of UTI.    HPI She tried to get an appt with Dr. Achilles Dunk, her urologist, but he does not have an appt until Feb She has urinary frequency and some bladder leakage and wears a pad She has been established with Dr. Achilles Dunk When she went back to school, when she gets up in the mornings, she would go at 5:30 am and again before leaving for school, then has to go to the bathroom again at 7 am; she has to go again at 8 am; by 11 am, she has gone to the bathroom at least 5 times No caffeinated drinks in the morning; she has no fluids other than shake before lunch; no tea or no chocolate She has interstitial cystitis; pain with urination, pain with intercourse; that has gotten better over the years on the minor dose of medicine She is taking TCA Cramping feeling on the RLQ; constipated; goes every 4 days, maybe 6 days Not exercising, gained some weight, stress  Depression screen [facility] 2/9 06/19/2017 11/14/2016 07/31/2016 06/20/2016  Decreased Interest 0 0 0 0  Down, Depressed, Hopeless 0 0 0 0  PHQ - 2 Score 0 0 0 0    Relevant past medical, surgical, family and social history reviewed Past Medical History:  Diagnosis Date  . Allergy   . Depression   . Depression with anxiety   . Endometriosis   . Fatigue   . Hypertension   . IBS (irritable bowel syndrome)   . Interstitial  cystitis   . Migraine   . Migraine   . Pre-diabetes 05/2015   Past Surgical History:  Procedure Laterality Date  . ABDOMINAL HYSTERECTOMY     complete.  Marland Kitchen BILATERAL OOPHORECTOMY  June 2015  . facial surgey     open bite wound  . GANGLION CYST EXCISION    . TUBAL LIGATION     Family History  Problem Relation Age of Onset  . Aneurysm Father   . Dementia Mother   . Hypertension Mother   . Kidney disease Mother   . Arthritis Mother   . Renal Disease Unknown   . Migraines Son   . Hypertension Sister   . Diabetes Sister   . Seizures Maternal Aunt   . Cancer Maternal Aunt        brain  . Stroke Maternal Grandmother   . COPD Brother   . Vision loss Brother   . Asthma Brother   . Hypertension Brother   . Diabetes Brother    Social History   Social History  . Marital status: Married    Spouse name: N/A  . Number of children: 1  .  Years of education: 12+   Occupational History  . Not on file.   Social History Main Topics  . Smoking status: Never Smoker  . Smokeless tobacco: Never Used  . Alcohol use No  . Drug use: No  . Sexual activity: Yes    Partners: Male    Birth control/ protection: Surgical   Other Topics Concern  . Not on file   Social History Narrative   Patient lives with her mother and son.   Patient works for [employer].          Interim medical history since last visit reviewed. Allergies and medications reviewed  Review of Systems Per HPI unless specifically indicated above     Objective:    BP 102/70   Pulse 88   Temp 98 F (36.7 C) (Oral)   Resp 14   Wt 185 lb 12.8 oz (84.3 kg)   SpO2 97%   BMI 29.10 kg/m   Wt Readings from Last 3 Encounters:  06/19/17 185 lb 12.8 oz (84.3 kg)  04/12/17 184 lb 4.8 oz (83.6 kg)  12/20/16 181 lb 9.6 oz (82.4 kg)    Physical Exam  Constitutional: She appears well-developed and well-nourished.  HENT:  Mouth/Throat: Mucous membranes are normal.  Eyes: EOM are normal.  No scleral icterus.  Cardiovascular: Normal rate and regular rhythm.   Pulmonary/Chest: Effort normal and breath sounds normal.  Abdominal: Soft. Bowel sounds are normal. She exhibits no distension and no mass. There is no tenderness. There is no guarding and no CVA tenderness.  Skin: She is not diaphoretic. No pallor.  Psychiatric: She has a normal mood and affect. Her behavior is normal. Her mood appears not anxious. She does not exhibit a depressed mood.    Results for orders placed or performed in visit on 04/12/17  Glucose, random  Result Value Ref Range   Glucose 74 65 - 99 mg/dL  Hemoglobin A5W  Result Value Ref Range   Hgb A1c MFr Bld 5.3 4.8 - 5.6 %   Est. average glucose Bld gHb Est-mCnc 105 mg/dL  Lipid panel  Result Value Ref Range   Cholesterol, Total 183 100 - 199 mg/dL   Triglycerides 61 0 - 149 mg/dL   HDL 87 >09 mg/dL   VLDL Cholesterol Cal 12 5 - 40 mg/dL   LDL Calculated 84 0 - 99 mg/dL   Chol/HDL Ratio 2.1 0.0 - 4.4 ratio  TSH  Result Value Ref Range   TSH 1.120 0.450 - 4.500 uIU/mL      Assessment & Plan:   Problem List Items Addressed This Visit      Genitourinary   Chronic interstitial cystitis (Chronic)    Checking urine today; increase imipramine to 20 mg at bedtime; avoid everything containing a methylxanthine (coffee, tea, chocolate); reassess by phone in 3 weeks        Other   Constipation    Fiber, water discussed; start miralax       Other Visit Diagnoses    Urinary frequency    -  Primary   Relevant Orders   POCT Urinalysis Dipstick   Needs flu shot       Relevant Orders   Flu Vaccine QUAD 6+ mos PF IM (Fluarix Quad PF)       Follow up plan: No Follow-up on file.  An after-visit summary was printed and given to the patient at check-out.  Please see the patient instructions which may contain other information and recommendations beyond  what is mentioned above in the assessment and plan.  Meds ordered this encounter    Medications  . DISCONTD: imipramine (TOFRANIL) 10 MG tablet    Sig: Take 10 mg by mouth at bedtime.  Marland Kitchen imipramine (TOFRANIL) 10 MG tablet    Sig: Take 2 tablets (20 mg total) by mouth at bedtime.    Dispense:  60 tablet    Refill:  2  . polyethylene glycol powder (GLYCOLAX/MIRALAX) powder    Sig: Take 17 g by mouth daily.    Dispense:  3350 g    Refill:  11    Orders Placed This Encounter  Procedures  . Flu Vaccine QUAD 6+ mos PF IM (Fluarix Quad PF)  . POCT Urinalysis Dipstick

## 2017-06-19 NOTE — Assessment & Plan Note (Signed)
Fiber, water discussed; start miralax

## 2017-06-19 NOTE — Assessment & Plan Note (Signed)
Checking urine today; increase imipramine to 20 mg at bedtime; avoid everything containing a methylxanthine (coffee, tea, chocolate); reassess by phone in 3 weeks

## 2017-06-19 NOTE — Patient Instructions (Addendum)
Let's increase your imipramine from 10 mg to 20 mg at bedtime Avoid all products that contain caffeine, tea, and chocolate for two weeks to see if this makes a difference There may be other foods or drinks that worsen your symptoms; a food diary may help Please call me in 3 weeks with an update   Interstitial Cystitis Interstitial cystitis is a condition that causes inflammation of the bladder. The bladder is a hollow organ in the lower part of your abdomen. It stores urine after the urine is made by your kidneys. With interstitial cystitis, you may have pain in the bladder area. You may also have a frequent and urgent need to urinate. The severity of interstitial cystitis can vary from person to person. You may have flare-ups of the condition, and then it may go away for a while. For many people who have this condition, it becomes a long-term problem. What are the causes? The cause of this condition is not known. What increases the risk? This condition is more likely to develop in women. What are the signs or symptoms? Symptoms of interstitial cystitis vary, and they can change over time. Symptoms may include:  Discomfort or pain in the bladder area. This can range from mild to severe. The pain may change in intensity as the bladder fills with urine or as it empties.  Pelvic pain.  An urgent need to urinate.  Frequent urination.  Pain during sexual intercourse.  Pinpoint bleeding on the bladder wall.  For women, the symptoms often get worse during menstruation. How is this diagnosed? This condition is diagnosed by evaluating your symptoms and ruling out other causes. A physical exam will be done. Various tests may be done to rule out other conditions. Common tests include:  Urine tests.  Cystoscopy. In this test, a tool that is like a very thin telescope is used to look into your bladder.  Biopsy. This involves taking a sample of tissue from the bladder wall to be examined under  a microscope.  How is this treated? There is no cure for interstitial cystitis, but treatment methods are available to control your symptoms. Work closely with your health care provider to find the treatments that will be most effective for you. Treatment options may include:  Medicines to relieve pain and to help reduce the number of times that you feel the need to urinate.  Bladder training. This involves learning ways to control when you urinate, such as: ? Urinating at scheduled times. ? Training yourself to delay urination. ? Doing exercises (Kegel exercises) to strengthen the muscles that control urine flow.  Lifestyle changes, such as changing your diet or taking steps to control stress.  Use of a device that provides electrical stimulation in order to reduce pain.  A procedure that stretches your bladder by filling it with air or fluid.  Surgery. This is rare. It is only done for extreme cases if other treatments do not help.  Follow these instructions at home:  Take medicines only as directed by your health care provider.  Use bladder training techniques as directed. ? Keep a bladder diary to find out which foods, liquids, or activities make your symptoms worse. ? Use your bladder diary to schedule bathroom trips. If you are away from home, plan to be near a bathroom at each of your scheduled times. ? Make sure you urinate just before you leave the house and just before you go to bed.  Do Kegel exercises as directed by  your health care provider.  Do not drink alcohol.  Do not use any tobacco products, including cigarettes, chewing tobacco, or electronic cigarettes. If you need help quitting, ask your health care provider.  Make dietary changes as directed by your health care provider. You may need to avoid spicy foods and foods that contain a high amount of potassium.  Limit your drinking of beverages that stimulate urination. These include soda, coffee, and tea.  Keep  all follow-up visits as directed by your health care provider. This is important. Contact a health care provider if:  Your symptoms do not get better after treatment.  Your pain and discomfort are getting worse.  You have more frequent urges to urinate.  You have a fever. Get help right away if:  You are not able to control your bladder at all. This information is not intended to replace advice given to you by your health care provider. Make sure you discuss any questions you have with your health care provider. Document Released: 05/05/2004 Document Revised: 02/10/2016 Document Reviewed: 05/12/2014 Elsevier Interactive Patient Education  2018 [employer].  [employer] Content in Foods See the following list for the dietary fiber content of some common foods. High-fiber foods High-fiber foods contain 4 grams or more (4g or more) of fiber per serving. They include:  Artichoke (fresh) - 1 medium has 10.3g of fiber.  Baked beans, plain or vegetarian (canned) -  cup has 5.2g of fiber.  Blackberries or raspberries (fresh) -  cup has 4g of fiber.  Bran cereal -  cup has 8.6g of fiber.  Bulgur (cooked) -  cup has 4g of fiber.  Kidney beans (canned) -  cup has 6.8g of fiber.  Lentils (cooked) -  cup has 7.8g of fiber.  Pear (fresh) - 1 medium has 5.1g of fiber.  Peas (frozen) -  cup has 4.4g of fiber.  Pinto beans (canned) -  cup has 5.5g of fiber.  Pinto beans (dried and cooked) -  cup has 7.7g of fiber.  Potato with skin (baked) - 1 medium has 4.4g of fiber.  Quinoa (cooked) -  cup has 5g of fiber.  Soybeans (canned, frozen, or fresh) -  cup has 5.1g of fiber.  Moderate-fiber foods Moderate-fiber foods contain 1-4 grams (1-4g) of fiber per serving. They include:  Almonds - 1 oz. has 3.5g of fiber.  Apple with skin - 1 medium has 3.3g of fiber.  Applesauce, sweetened -  cup has 1.5g of fiber.  Bagel, plain - one 4-inch (10-cm) bagel has 2g of  fiber.  Banana - 1 medium has 3.1g of fiber.  Broccoli (cooked) -  cup has 2.5g of fiber.  Carrots (cooked) -  cup has 2.3g of fiber.  Corn (canned or frozen) -  cup has 2.1g of fiber.  Corn tortilla - one 6-inch (15-cm) tortilla has 1.5g of fiber.  Green beans (canned) -  cup has 2g of fiber.  Instant oatmeal -  cup has about 2g of fiber.  Long-grain brown rice (cooked) - 1 cup has 3.5g of fiber.  Macaroni, enriched (cooked) - 1 cup has 2.5g of fiber.  Melon - 1 cup has 1.4g of fiber.  Multigrain cereal -  cup has about 2-4g of fiber.  Orange - 1 small has 3.1g of fiber.  Potatoes, mashed -  cup has 1.6g of fiber.  Raisins - 1/4 cup has 1.6g of fiber.  Squash -  cup has 2.9g of fiber.  Sunflower seeds -  cup  has 1.1g of fiber.  Tomato - 1 medium has 1.5g of fiber.  Vegetable or soy patty - 1 has 3.4g of fiber.  Whole-wheat bread - 1 slice has 2g of fiber.  Whole-wheat spaghetti -  cup has 3.2g of fiber.  Low-fiber foods Low-fiber foods contain less than 1 gram (less than 1g) of fiber per serving. They include:  Egg - 1 large.  Flour tortilla - one 6-inch (15-cm) tortilla.  Fruit juice -  cup.  Lettuce - 1 cup.  Meat, poultry, or fish - 1 oz.  Milk - 1 cup.  Spinach (raw) - 1 cup.  White bread - 1 slice.  White rice -  cup.  Yogurt -  cup.  Actual amounts of fiber in foods may be different depending on processing. Talk with your dietitian about how much fiber you need in your diet. This information is not intended to replace advice given to you by your health care provider. Make sure you discuss any questions you have with your health care provider. Document Released: 01/21/2007 Document Revised: 02/10/2016 Document Reviewed: 10/28/2015 Elsevier Interactive Patient Education  2018 [employer].

## 2017-07-12 ENCOUNTER — Other Ambulatory Visit: Payer: Self-pay | Admitting: Family Medicine

## 2017-07-19 ENCOUNTER — Emergency Department: Payer: BC Managed Care – PPO

## 2017-07-19 ENCOUNTER — Encounter: Payer: Self-pay | Admitting: Emergency Medicine

## 2017-07-19 ENCOUNTER — Emergency Department
Admission: EM | Admit: 2017-07-19 | Discharge: 2017-07-19 | Disposition: A | Discharge status: Home / Self Care | Payer: BC Managed Care – PPO | Attending: Emergency Medicine | Admitting: Emergency Medicine

## 2017-07-19 DIAGNOSIS — S161XXA Strain of muscle, fascia and tendon at neck level, initial encounter: Secondary | ICD-10-CM

## 2017-07-19 DIAGNOSIS — Y929 Unspecified place or not applicable: Secondary | ICD-10-CM | POA: Insufficient documentation

## 2017-07-19 DIAGNOSIS — Z79899 Other long term (current) drug therapy: Secondary | ICD-10-CM | POA: Insufficient documentation

## 2017-07-19 DIAGNOSIS — Y999 Unspecified external cause status: Secondary | ICD-10-CM | POA: Insufficient documentation

## 2017-07-19 DIAGNOSIS — M7918 Myalgia, other site: Secondary | ICD-10-CM

## 2017-07-19 DIAGNOSIS — Y939 Activity, unspecified: Secondary | ICD-10-CM | POA: Insufficient documentation

## 2017-07-19 DIAGNOSIS — I10 Essential (primary) hypertension: Secondary | ICD-10-CM | POA: Diagnosis not present

## 2017-07-19 DIAGNOSIS — M791 Myalgia, unspecified site: Secondary | ICD-10-CM | POA: Insufficient documentation

## 2017-07-19 DIAGNOSIS — S199XXA Unspecified injury of neck, initial encounter: Secondary | ICD-10-CM | POA: Diagnosis present

## 2017-07-19 MED ORDER — TRAMADOL HCL 50 MG PO TABS
50.0000 mg | ORAL_TABLET | Freq: Once | ORAL | Status: AC
  Administered 2017-07-19: 50 mg via ORAL
  Filled 2017-07-19: qty 1

## 2017-07-19 MED ORDER — IBUPROFEN 600 MG PO TABS
600.0000 mg | ORAL_TABLET | Freq: Once | ORAL | Status: AC
  Administered 2017-07-19: 600 mg via ORAL
  Filled 2017-07-19: qty 1

## 2017-07-19 MED ORDER — IBUPROFEN 600 MG PO TABS: 600.0000 mg | ORAL_TABLET | Freq: Three times a day (TID) | ORAL | 0 refills | Status: DC | PRN

## 2017-07-19 MED ORDER — CYCLOBENZAPRINE HCL 10 MG PO TABS: 10.0000 mg | ORAL_TABLET | Freq: Three times a day (TID) | ORAL | 0 refills | Status: DC | PRN

## 2017-07-19 MED ORDER — CYCLOBENZAPRINE HCL 10 MG PO TABS
10.0000 mg | ORAL_TABLET | Freq: Once | ORAL | Status: AC
  Administered 2017-07-19: 10 mg via ORAL
  Filled 2017-07-19: qty 1

## 2017-07-19 MED ORDER — TRAMADOL HCL 50 MG PO TABS: 50.0000 mg | ORAL_TABLET | Freq: Two times a day (BID) | ORAL | 0 refills | Status: DC | PRN
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
## 2017-07-19 NOTE — ED Notes (Signed)
Returned from xray

## 2017-07-19 NOTE — ED Notes (Signed)
XR notified patient is ready for XR.

## 2017-07-19 NOTE — ED Notes (Signed)
Transported to XR  
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
## 2017-07-20 ENCOUNTER — Ambulatory Visit (INDEPENDENT_AMBULATORY_CARE_PROVIDER_SITE_OTHER): Payer: BC Managed Care – PPO | Admitting: Family Medicine

## 2017-07-20 ENCOUNTER — Encounter: Payer: Self-pay | Admitting: Family Medicine

## 2017-07-20 VITALS — BP 122/74 | HR 100 | Temp 98.1°F | Ht 67.0 in | Wt 187.6 lb

## 2017-07-20 DIAGNOSIS — M25511 Pain in right shoulder: Secondary | ICD-10-CM

## 2017-07-20 DIAGNOSIS — M546 Pain in thoracic spine: Secondary | ICD-10-CM

## 2017-07-20 DIAGNOSIS — S161XXD Strain of muscle, fascia and tendon at neck level, subsequent encounter: Secondary | ICD-10-CM | POA: Diagnosis not present

## 2017-07-20 MED ORDER — IBUPROFEN 600 MG PO TABS: 600.0000 mg | ORAL_TABLET | Freq: Four times a day (QID) | ORAL | 0 refills | Status: DC | PRN

## 2017-07-20 NOTE — Patient Instructions (Addendum)
Okay for an extended work note if needed next Monday and/or Tuesday, so just call me Rest and take it easy over the weekend   Thoracic Strain Thoracic strain is an injury to the muscles or tendons that attach to the upper back. A strain can be mild or severe. A mild strain may take only 1-2 weeks to heal. A severe strain involves torn muscles or tendons, so it may take 6-8 weeks to heal. Follow these instructions at home:  Rest as needed. Limit your activity as told by your doctor.  If directed, put ice on the injured area: ? Put ice in a plastic bag. ? Place a towel between your skin and the bag. ? Leave the ice on for 20 minutes, 2-3 times per day.  Take over-the-counter and prescription medicines only as told by your doctor.  Begin doing exercises as told by your doctor or physical therapist.  Warm up before being active.  Bend your knees before you lift heavy objects.  Keep all follow-up visits as told by your doctor. This is important. Contact a doctor if:  Your pain is not helped by medicine.  Your pain, bruising, or swelling is getting worse.  You have a fever. Get help right away if:  You have shortness of breath.  You have chest pain.  You have weakness or loss of feeling (numbness) in your legs.  You cannot control when you pee (urinate). This information is not intended to replace advice given to you by your health care provider. Make sure you discuss any questions you have with your health care provider. Document Released: 02/21/2008 Document Revised: 05/06/2016 Document Reviewed: 10/29/2014 Elsevier Interactive Patient Education  2018 [employer]  Hip Pain The hip is the joint between the upper legs and the lower pelvis. The bones, cartilage, tendons, and muscles of your hip joint support your body and allow you to move around. Hip pain can range from a minor ache to severe pain in one or both of your hips. The pain may be felt on the inside of the hip  joint near the groin, or the outside near the buttocks and upper thigh. You may also have swelling or stiffness. Follow these instructions at home: Managing pain, stiffness, and swelling  If directed, apply ice to the injured area. ? Put ice in a plastic bag. ? Place a towel between your skin and the bag. ? Leave the ice on for 20 minutes, 2-3 times a day  Sleep with a pillow between your legs on your most comfortable side.  Avoid any activities that cause pain. General instructions  Take over-the-counter and prescription medicines only as told by your health care provider.  Do any exercises as told by your health care provider.  Record the following: ? How often you have hip pain. ? The location of your pain. ? What the pain feels like. ? What makes the pain worse.  Keep all follow-up visits as told by your health care provider. This is important. Contact a health care provider if:  You cannot put weight on your leg.  Your pain or swelling continues or gets worse after one week.  It gets harder to walk.  You have a fever. Get help right away if:  You fall.  You have a sudden increase in pain and swelling in your hip.  Your hip is red or swollen or very tender to touch. Summary  Hip pain can range from a minor ache to severe pain in  one or both of your hips.  The pain may be felt on the inside of the hip joint near the groin, or the outside near the buttocks and upper thigh.  Avoid any activities that cause pain.  Record how often you have hip pain, the location of the pain, what makes it worse and what it feels like. This information is not intended to replace advice given to you by your health care provider. Make sure you discuss any questions you have with your health care provider. Document Released: 02/22/2010 Document Revised: 08/07/2016 Document Reviewed: 08/07/2016 Elsevier Interactive Patient Education  2017 [employer].
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
## 2017-07-24 ENCOUNTER — Ambulatory Visit: Payer: BC Managed Care – PPO | Admitting: Family Medicine

## 2017-07-25 ENCOUNTER — Other Ambulatory Visit: Payer: Self-pay | Admitting: Family Medicine

## 2017-07-25 ENCOUNTER — Encounter: Payer: Self-pay | Admitting: Family Medicine

## 2017-07-25 MED ORDER — INSULIN PEN NEEDLE 31G X 5 MM MISC: 1 refills | Status: DC

## 2017-07-25 NOTE — Telephone Encounter (Signed)
Rx for pen needles approved, two month supply on 07/12/17 I received another request today Please contact patient Verify she is using the medicine correctly She should NOT have used 60 needles already Thank you

## 2017-07-25 NOTE — Telephone Encounter (Signed)
Called pt to confirm whether or not she is indeed out of pen needles. Pt states that she is out of pen needles because [facility] [place] [facility] that [facility] never [facility] 07/12/17 rx. Pt states that she would like all prescriptions going forward [facility] [address] (pharmacy changed) Thanks!

## 2017-07-25 NOTE — Telephone Encounter (Signed)
Okay, thank you so much!

## 2017-07-26 ENCOUNTER — Other Ambulatory Visit: Payer: Self-pay | Admitting: Family Medicine

## 2017-07-26 ENCOUNTER — Encounter: Payer: Self-pay | Admitting: Family Medicine

## 2017-07-26 NOTE — Telephone Encounter (Signed)
Copied from CRM [id number]. Topic: Quick Communication - See Telephone Encounter >> Jul 26, 2017  2:11 PM Viviann SpareWhite, Selina wrote: CRM for notification. See Telephone encounter for: 07/26/17. [facility] at [address], [place], [place] call about a RX for Mini Pin Needle 5 ml 31 gauge for the pt. Pt told pharmacy it has been taking care of. [facility] stated they have not rec'd a RX for the patient. Pharmacy requesting RX be sent to them so them can fill for the patient.

## 2017-07-26 NOTE — Telephone Encounter (Signed)
I called pharmacy and they have ready for pick-up

## 2017-07-26 NOTE — Telephone Encounter (Signed)
Just call it in please

## 2017-07-27 ENCOUNTER — Other Ambulatory Visit: Payer: Self-pay

## 2017-07-27 MED ORDER — IMIPRAMINE HCL 10 MG PO TABS: 20.0000 mg | ORAL_TABLET | Freq: Every day | ORAL | 3 refills | Status: DC

## 2017-07-27 NOTE — Telephone Encounter (Signed)
Ins. Requesting 90 day 

## 2017-08-12 IMAGING — CR DG ABDOMEN 2V
5 series · 5 of 5 positions shown · non-contrast
Comparison: None

CLINICAL DATA: Constipation for 2-3 weeks, single minimal bowel
movement [REDACTED] morning, abdominal pain, bloating, nausea,
cramping

EXAM:
ABDOMEN - 2 VIEW

[abdomen erect (1 of 3)]
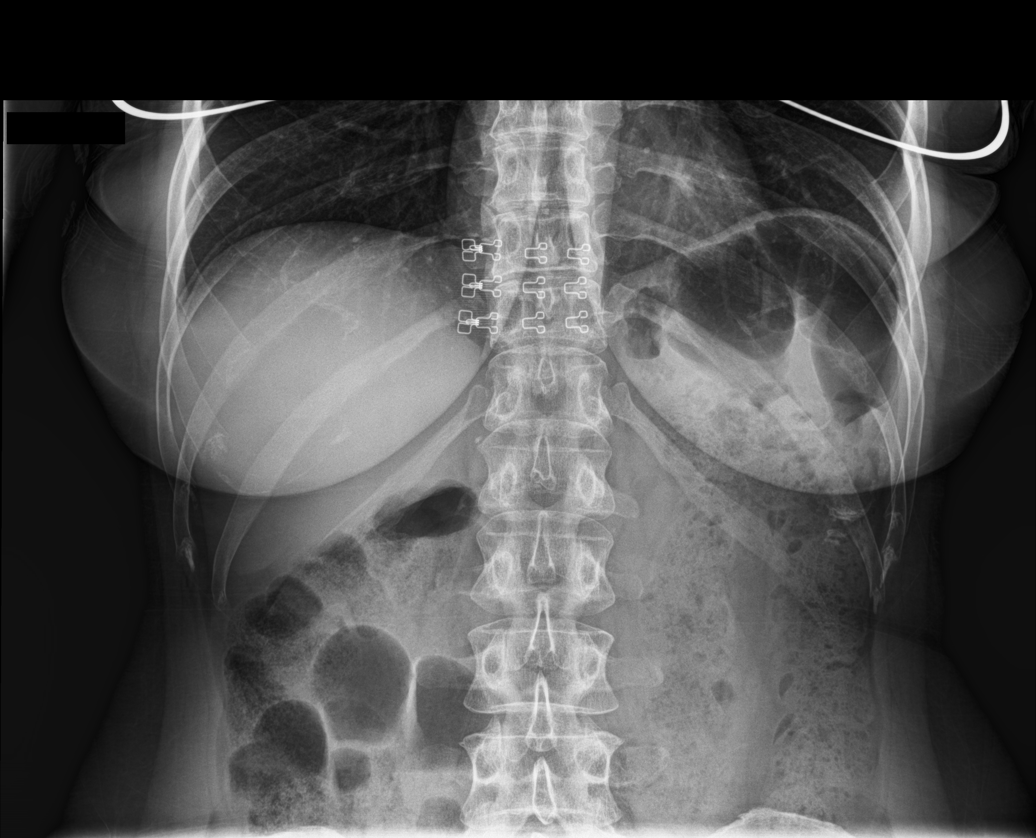

[abdomen supine (1 of 2)]
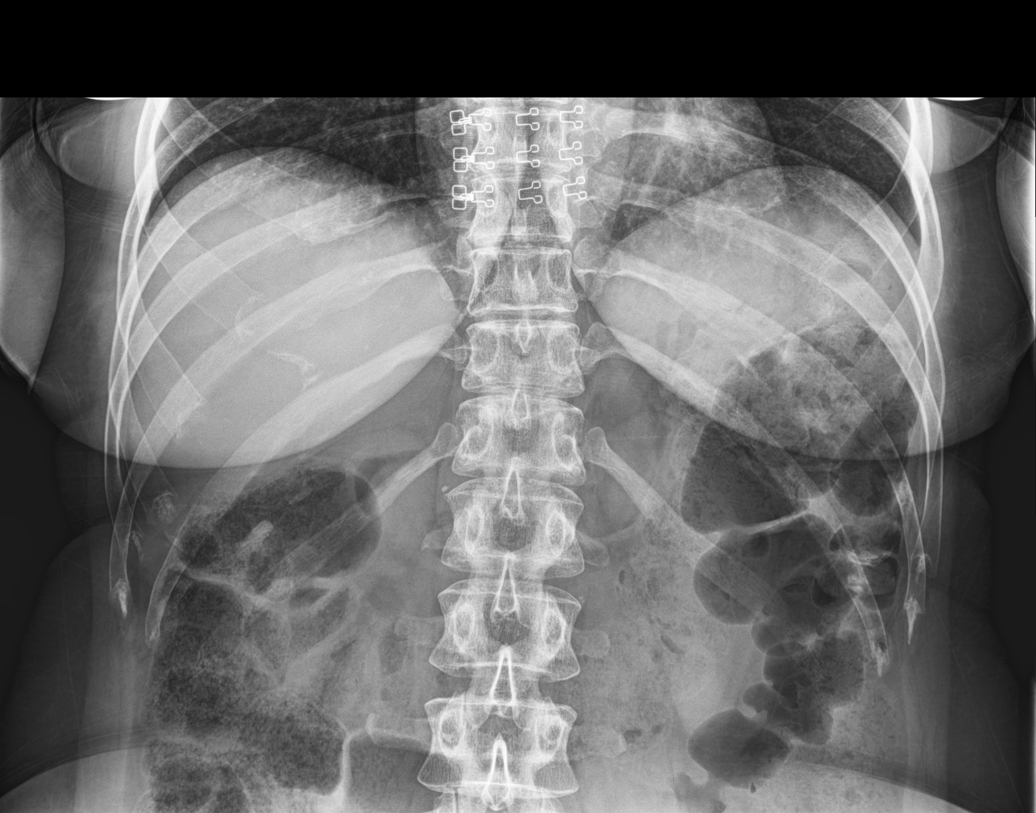

[abdomen erect (2 of 3)]
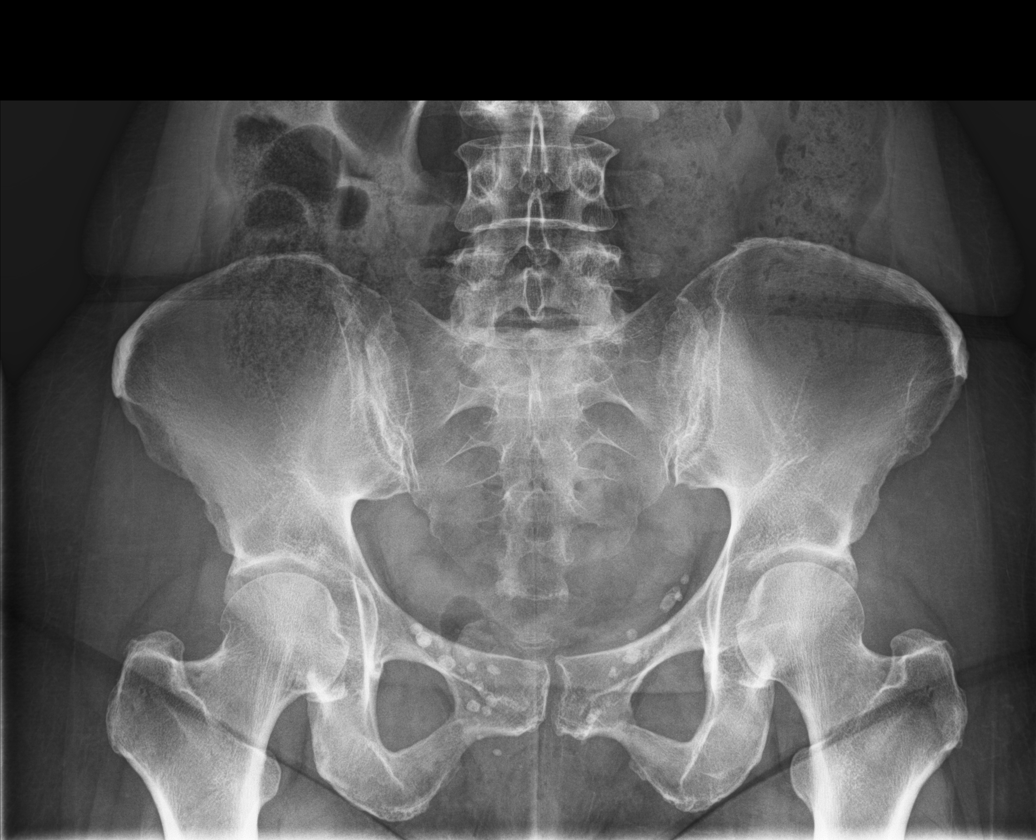

[abdomen supine (2 of 2)]
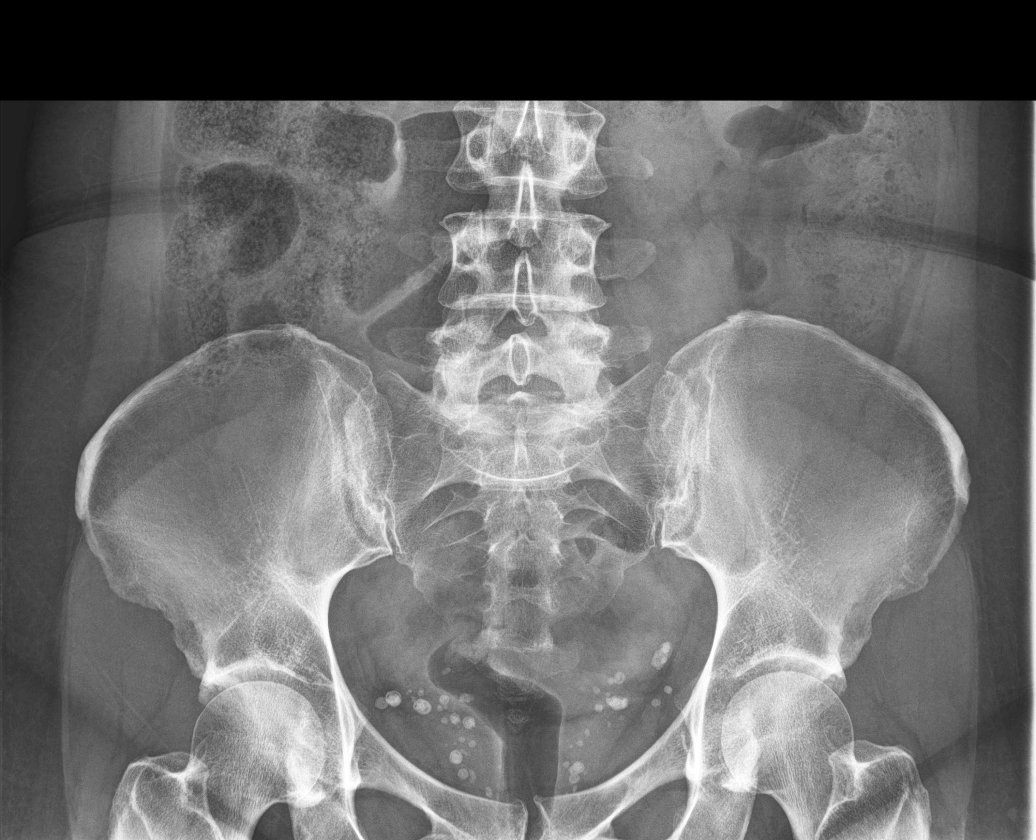

[abdomen erect (3 of 3)]
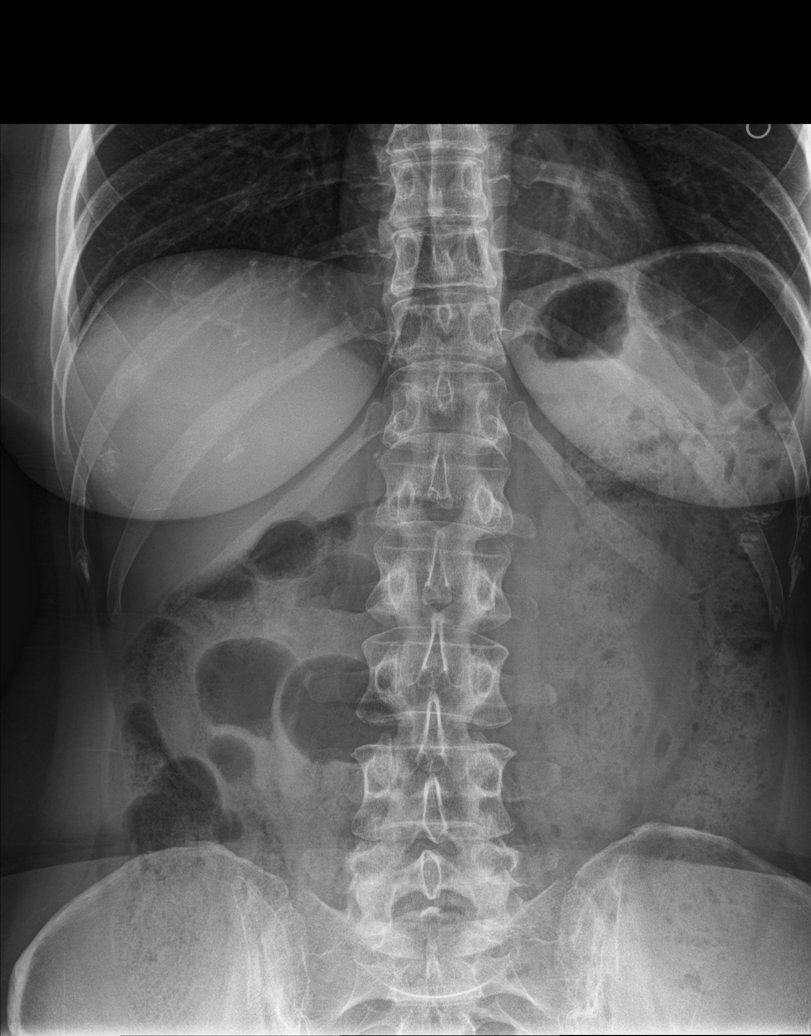

[5 of 5 positions shown; findings below may reference images not displayed]

FINDINGS: Lung bases clear.

Increased stool from ascending colon through descending colon.

Absence of significant stool identified radiographically at the
sigmoid colon and rectum.

Gas present at rectum.

No bowel dilatation, bowel wall thickening or evidence of
obstruction.

No free intraperitoneal air.

Bones demineralized.

Numerous pelvic phleboliths.
IMPRESSION: Increased stool throughout proximal [DATE] of colon.

## 2017-09-14 ENCOUNTER — Encounter: Payer: Self-pay | Admitting: Family Medicine

## 2017-09-25 ENCOUNTER — Other Ambulatory Visit: Payer: Self-pay | Admitting: Family Medicine

## 2017-10-14 ENCOUNTER — Other Ambulatory Visit: Payer: Self-pay | Admitting: Family Medicine

## 2017-10-15 NOTE — Telephone Encounter (Signed)
Left voicemail with pharmacy 

## 2017-10-15 NOTE — Telephone Encounter (Signed)
I just prescribed a 3 month supply of needles for saxenda on 09/25/17 Please resolve with pharmacy

## 2017-10-18 ENCOUNTER — Encounter: Payer: Self-pay | Admitting: Family Medicine

## 2017-10-18 MED ORDER — INSULIN PEN NEEDLE 31G X 5 MM MISC: 2 refills | Status: DC

## 2017-10-23 ENCOUNTER — Ambulatory Visit: Payer: BC Managed Care – PPO | Admitting: Family Medicine

## 2017-10-23 ENCOUNTER — Encounter: Payer: Self-pay | Admitting: Family Medicine

## 2017-10-23 VITALS — BP 120/82 | HR 100 | Temp 98.4°F | Resp 16 | Ht 67.0 in | Wt 188.9 lb

## 2017-10-23 DIAGNOSIS — J0140 Acute pansinusitis, unspecified: Secondary | ICD-10-CM

## 2017-10-23 MED ORDER — AMOXICILLIN-POT CLAVULANATE 875-125 MG PO TABS
1.0000 | ORAL_TABLET | Freq: Two times a day (BID) | ORAL | 0 refills | Status: AC
Start: 2017-10-23 — End: 2017-11-02

## 2017-10-23 NOTE — Progress Notes (Signed)
Name: Brittney FarrierMichelle F Gindlesperger   MRN: [id number]    DOB: 12/16/1970   Date:10/23/2017       Progress Note  Subjective  Chief Complaint  Chief Complaint  Patient presents with  . Sinusitis    headache, sinus pressure, nausea  . Ear Pain    HPI  Pt presents with left ear pain and clear drainage in the back of her throat x3 days.  Headache that began yesterday with upper dental pain; 1 day of low-grade fever yesterday which has since resolved, also having intermittent nausea and some loose stools.  Denies vomiting, abdominal pain, body aches, chills.  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 June 2018 for ongoing left ear pain, chronic rhinitis, and chronic maxillary sinusitis (Was sent by Dr. Andee PolesVaught with [facility] for sub-specialty consult) This rinse typically controls her symptoms on a day-to-day basis, but this episode has caused her significant pain to return.  Patient Active Problem List   Diagnosis Date Noted  . Status post vaginal hysterectomy 04/11/2016  . Anxiety 04/11/2016  . Allergic rhinitis 12/31/2015  . Rectal mass 12/23/2015  . Dark stools 12/23/2015  . Constipation 12/23/2015  . Impaired fasting glucose 12/23/2015  . Overweight (BMI 25.0-29.9) 07/21/2015  . Surgical menopause 04/07/2015  . Chronic right hip pain 04/05/2015  . Endometriosis   . Fatigue   . Allergy   . IBS (irritable bowel syndrome)   . Migraine without aura 10/17/2013  . Headache 10/17/2013  . FOM (frequency of micturition) 04/24/2013  . Incomplete bladder emptying 04/24/2013  . Chronic interstitial cystitis 04/24/2013    Social History   Tobacco Use  . Smoking status: Never Smoker  . Smokeless tobacco: Never Used  Substance Use Topics  . Alcohol use: No    Alcohol/week: 0.0 oz     Current Outpatient Medications:  .  albuterol  (PROVENTIL HFA;VENTOLIN HFA) 108 (90 BASE) MCG/ACT inhaler, Inhale 2 puffs into the lungs every 6 (six) hours as needed for wheezing or shortness of breath., Disp: , Rfl:  .  B Complex Vitamins (B COMPLEX PO), Take by mouth daily., Disp: , Rfl:  .  BIOTIN PO, Take 1 tablet by mouth daily. , Disp: , Rfl:  .  budesonide (PULMICORT) 0.5 MG/2ML nebulizer solution, Take 0.5 mg by nebulization 2 (two) times daily., Disp: , Rfl:  .  Coenzyme Q10 (CO Q 10 PO), Take 1 tablet by mouth daily. , Disp: , Rfl:  .  Cyanocobalamin (VITAMIN B-12 PO), Take 1 tablet by mouth daily. , Disp: , Rfl:  .  cyclobenzaprine (FLEXERIL) 10 MG tablet, Take 1 tablet (10 mg total) by mouth 3 (three) times daily as needed., Disp: 15 tablet, Rfl: 0 .  estradiol (ESTRACE) 1 MG tablet, Take 1.5 tablets (1.5 mg total) by mouth daily., Disp: 135 tablet, Rfl: 3 .  ibuprofen (ADVIL,MOTRIN) 600 MG tablet, Take 1 tablet (600 mg total) by mouth every 6 (six) hours as needed., Disp: 30 tablet, Rfl: 0 .  imipramine (TOFRANIL) 10 MG tablet, Take 2 tablets (20 mg total) at bedtime by mouth., Disp: 180 tablet, Rfl: 3 .  Insulin Pen Needle (B-D UF III MINI PEN NEEDLES) 31G X 5 MM MISC, FOR USE WITH SAXENDA USE DAILY, Disp: 30 each, Rfl: 2 .  Multiple Vitamin (MULTIVITAMIN) tablet, Take 1 tablet by mouth  daily., Disp: , Rfl:  .  Omega-3 Fatty Acids (FISH OIL PO), Take by mouth daily., Disp: , Rfl:  .  polyethylene glycol powder (GLYCOLAX/MIRALAX) powder, Take 17 g by mouth daily., Disp: 3350 g, Rfl: 11 .  SAXENDA 18 MG/3ML SOPN, Inject 3 mg into the skin daily., Disp: 5 pen, Rfl: 5 .  sodium chloride 0.9 % SOLN 4 mL with gentamicin (PF) 10 MG/ML SOLN 10 mg, 10 mg by Intrathecal route daily., Disp: , Rfl:  .  topiramate (TOPAMAX) 100 MG tablet, TAKE 1 TABLET (100 MG TOTAL) BY MOUTH DAILY., Disp: 90 tablet, Rfl: 3 .  traMADol (ULTRAM) 50 MG tablet, Take 1 tablet (50 mg total) by mouth every 12 (twelve) hours as needed., Disp: 12 tablet, Rfl: 0 .   verapamil (CALAN) 80 MG tablet, TAKE 1 TABLET (80 MG TOTAL) BY MOUTH 2 (TWO) TIMES DAILY., Disp: 180 tablet, Rfl: 3  Allergies  Allergen Reactions  . Imitrex [Sumatriptan] Anaphylaxis    ROS  Ten systems reviewed and is negative except as mentioned in HPI  Objective  Vitals:   10/23/17 1132  BP: 120/82  Pulse: 100  Resp: 16  Temp: 98.4 F (36.9 C)  TempSrc: Oral  SpO2: 98%  Weight: 188 lb 14.4 oz (85.7 kg)  Height: 5\' 7"  (1.702 m)   Body mass index is 29.59 kg/m.  Nursing Note and Vital Signs reviewed.  Physical Exam  Constitutional: Patient appears well-developed and well-nourished.  No distress.  HEENT: head atraumatic, normocephalic, pupils equal and reactive to light, EOM's intact, TM's without erythema or bulging, bilateral maxillary and frontal sinus tenderness , neck supple with bilateral submandibular lymphadenopathy, oropharynx mildly erythematous and moist without exudate Cardiovascular: Normal rate, regular rhythm, S1/S2 present.  No murmur or rub heard. No BLE edema. Pulmonary/Chest: Effort normal and breath sounds clear. No respiratory distress or retractions. Psychiatric: Patient has a normal mood and affect. behavior is normal. Judgment and thought content normal.  No results found for this or any previous visit (from the past 72 hour(s)).  Assessment & Plan  1. Acute non-recurrent pansinusitis - Advised to follow up in 5-7 days if not improving due to her history of chronic sinusitis and use of nasal rinse as discussed in HPI.  If not improving, we will refer back to Dr. Andee Poles for further evaluation. - amoxicillin-clavulanate (AUGMENTIN) 875-125 MG tablet; Take 1 tablet by mouth 2 (two) times daily for 10 days.  Dispense: 20 tablet; Refill: 0  -Red flags and when to present for emergency care or RTC including fever >101.31F, chest pain, shortness of breath, new/worsening/un-resolving symptoms, periorbital swelling or pain with ocular movement reviewed  with patient at time of visit. Follow up and care instructions discussed and provided in AVS.

## 2017-10-23 NOTE — Patient Instructions (Addendum)

## 2017-10-24 ENCOUNTER — Ambulatory Visit: Payer: BC Managed Care – PPO

## 2017-10-24 DIAGNOSIS — J329 Chronic sinusitis, unspecified: Secondary | ICD-10-CM | POA: Insufficient documentation

## 2017-10-31 NOTE — Telephone Encounter (Signed)
Closing out/resolving MyChart note open since:  07/25/17  Rx sent in January 2019; resolved

## 2017-10-31 NOTE — Progress Notes (Signed)
Closing out scanned document note open since  09/14/17

## 2017-10-31 NOTE — Telephone Encounter (Signed)
Closing out/resolving MyChart note open since:  07/26/17  Review of chart shows that letter was written, issue was resolved Signing off for staff

## 2017-12-06 ENCOUNTER — Telehealth: Payer: Self-pay | Admitting: Family Medicine

## 2017-12-06 NOTE — Telephone Encounter (Signed)
Patient needs an appt please I'll give one more refill of medicine and we look forward to seeing her soon

## 2017-12-10 NOTE — Telephone Encounter (Signed)
Spoke with patient and she scheduled appt for 01-07-18
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
## 2017-12-24 ENCOUNTER — Encounter: Payer: Self-pay | Admitting: Nurse Practitioner

## 2017-12-24 ENCOUNTER — Ambulatory Visit: Payer: BC Managed Care – PPO | Admitting: Nurse Practitioner

## 2017-12-24 VITALS — BP 112/68 | HR 83 | Ht 67.0 in | Wt 191.6 lb

## 2017-12-24 DIAGNOSIS — R51 Headache: Secondary | ICD-10-CM

## 2017-12-24 DIAGNOSIS — G43009 Migraine without aura, not intractable, without status migrainosus: Secondary | ICD-10-CM

## 2017-12-24 DIAGNOSIS — R519 Headache, unspecified: Secondary | ICD-10-CM

## 2017-12-24 MED ORDER — TOPIRAMATE 100 MG PO TABS: ORAL_TABLET | ORAL | 3 refills | Status: DC

## 2017-12-24 MED ORDER — VERAPAMIL HCL 80 MG PO TABS: ORAL_TABLET | ORAL | 3 refills | Status: DC

## 2017-12-24 NOTE — Patient Instructions (Signed)
Patient will continue Topamax as ordered will refill Will continue Verapamil as ordered will refill Call for increase in headaches Migraine tracker APP to record  F/U yearly and prn

## 2017-12-27 NOTE — Progress Notes (Signed)
I have reviewed and agreed above plan. 

## 2017-12-28 ENCOUNTER — Other Ambulatory Visit: Payer: Self-pay | Admitting: Nurse Practitioner

## 2018-01-07 ENCOUNTER — Ambulatory Visit: Payer: BC Managed Care – PPO | Admitting: Family Medicine

## 2018-01-07 ENCOUNTER — Encounter: Payer: Self-pay | Admitting: Family Medicine

## 2018-01-07 DIAGNOSIS — E663 Overweight: Secondary | ICD-10-CM

## 2018-01-07 DIAGNOSIS — K5900 Constipation, unspecified: Secondary | ICD-10-CM

## 2018-01-07 MED ORDER — INSULIN PEN NEEDLE 31G X 5 MM MISC: 5 refills | Status: DC

## 2018-01-07 MED ORDER — SAXENDA 18 MG/3ML ~~LOC~~ SOPN: 3.0000 mg | PEN_INJECTOR | Freq: Every day | SUBCUTANEOUS | 5 refills | Status: DC

## 2018-01-07 NOTE — Assessment & Plan Note (Signed)
Encouragement given to increase her water intake; increase activity; she has a fitbit and is getting 10k steps a day

## 2018-01-07 NOTE — Assessment & Plan Note (Signed)
Consider food diary; continue miralax; avoid acidic foods if bothersome
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
## 2018-01-07 NOTE — Patient Instructions (Addendum)
Check out the information at familydoctor.org entitled "Nutrition for Weight Loss: What You Need to Know about Fad Diets" Try to lose between 1-2 pounds per week by taking in fewer calories and burning off more calories You can succeed by limiting portions, limiting foods dense in calories and fat, becoming more active, and drinking 8 glasses of water a day (64 ounces) Don't skip meals, especially breakfast, as skipping meals may alter your metabolism Do not use over-the-counter weight loss pills or gimmicks that claim rapid weight loss A healthy BMI (or body mass index) is between 18.5 and 24.9 You can calculate your ideal BMI at the NIH website [employer].huhttp://www.nhlbi.nih.gov/health/educational/lose_wt/BMI/bmicalc.htm  High-Fiber Diet Fiber, also called dietary fiber, is a type of carbohydrate found in fruits, vegetables, whole grains, and beans. A high-fiber diet can have many health benefits. Your health care provider may recommend a high-fiber diet to help:  Prevent constipation. Fiber can make your bowel movements more regular.  Lower your cholesterol.  Relieve hemorrhoids, uncomplicated diverticulosis, or irritable bowel syndrome.  Prevent overeating as part of a weight-loss plan.  Prevent heart disease, type 2 diabetes, and certain cancers.  What is my plan? The recommended daily intake of fiber includes:  38 grams for men under age 49.  30 grams for men over age 49.  25 grams for women under age 49.  21 grams for women over age 49.  You can get the recommended daily intake of dietary fiber by eating a variety of fruits, vegetables, grains, and beans. Your health care provider may also recommend a fiber supplement if it is not possible to get enough fiber through your diet. What do I need to know about a high-fiber diet?  Fiber supplements have not been widely studied for their effectiveness, so it is better to get fiber through food sources.  Always check the fiber content on  thenutrition facts label of any prepackaged food. Look for foods that contain at least 5 grams of fiber per serving.  Ask your dietitian if you have questions about specific foods that are related to your condition, especially if those foods are not listed in the following section.  Increase your daily fiber consumption gradually. Increasing your intake of dietary fiber too quickly may cause bloating, cramping, or gas.  Drink plenty of water. Water helps you to digest fiber. What foods can I eat? Grains Whole-grain breads. Multigrain cereal. Oats and oatmeal. Brown rice. Barley. Bulgur wheat. Millet. Bran muffins. Popcorn. Rye wafer crackers. Vegetables Sweet potatoes. Spinach. Kale. Artichokes. Cabbage. Broccoli. Green peas. Carrots. Squash. Fruits Berries. Pears. Apples. Oranges. Avocados. Prunes and raisins. Dried figs. Meats and Other Protein Sources Navy, kidney, pinto, and soy beans. Split peas. Lentils. Nuts and seeds. Dairy Fiber-fortified yogurt. Beverages Fiber-fortified soy milk. Fiber-fortified orange juice. Other Fiber bars. The items listed above may not be a complete list of recommended foods or beverages. Contact your dietitian for more options. What foods are not recommended? Grains White bread. Pasta made with refined flour. White rice. Vegetables Fried potatoes. Canned vegetables. Well-cooked vegetables. Fruits Fruit juice. Cooked, strained fruit. Meats and Other Protein Sources Fatty cuts of meat. Fried Environmental education officerpoultry or fried fish. Dairy Milk. Yogurt. Cream cheese. Sour cream. Beverages Soft drinks. Other Cakes and pastries. Butter and oils. The items listed above may not be a complete list of foods and beverages to avoid. Contact your dietitian for more information. What are some tips for including high-fiber foods in my diet?  Eat a wide variety of high-fiber foods.  Make sure that half of all grains consumed each day are whole grains.  Replace breads and  cereals made from refined flour or white flour with whole-grain breads and cereals.  Replace white rice with brown rice, bulgur wheat, or millet.  Start the day with a breakfast that is high in fiber, such as a cereal that contains at least 5 grams of fiber per serving.  Use beans in place of meat in soups, salads, or pasta.  Eat high-fiber snacks, such as berries, raw vegetables, nuts, or popcorn. This information is not intended to replace advice given to you by your health care provider. Make sure you discuss any questions you have with your health care provider. Document Released: 09/04/2005 Document Revised: 02/10/2016 Document Reviewed: 02/17/2014 Elsevier Interactive Patient Education  [employer].

## 2018-03-09 ENCOUNTER — Other Ambulatory Visit: Payer: Self-pay | Admitting: Nurse Practitioner

## 2018-04-09 ENCOUNTER — Other Ambulatory Visit: Payer: Self-pay | Admitting: Family Medicine

## 2018-04-09 ENCOUNTER — Ambulatory Visit: Payer: BC Managed Care – PPO | Admitting: Family Medicine

## 2018-04-09 ENCOUNTER — Encounter: Payer: Self-pay | Admitting: Family Medicine

## 2018-04-09 VITALS — BP 112/72 | HR 92 | Temp 98.1°F | Resp 12 | Ht 67.0 in | Wt 186.7 lb

## 2018-04-09 DIAGNOSIS — R5383 Other fatigue: Secondary | ICD-10-CM

## 2018-04-09 DIAGNOSIS — K5900 Constipation, unspecified: Secondary | ICD-10-CM

## 2018-04-09 DIAGNOSIS — E663 Overweight: Secondary | ICD-10-CM

## 2018-04-09 DIAGNOSIS — D709 Neutropenia, unspecified: Secondary | ICD-10-CM

## 2018-04-09 LAB — COMPLETE METABOLIC PANEL WITH GFR
AG Ratio: 1.6 (calc) (ref 1.0–2.5)
ALBUMIN MSPROF: 4.1 g/dL (ref 3.6–5.1)
ALKALINE PHOSPHATASE (APISO): 47 U/L (ref 33–115)
ALT: 13 U/L (ref 6–29)
AST: 21 U/L (ref 10–35)
BUN: 8 mg/dL (ref 7–25)
CHLORIDE: 111 mmol/L — AB (ref 98–110)
CO2: 24 mmol/L (ref 20–32)
CREATININE: 0.93 mg/dL (ref 0.50–1.10)
Calcium: 8.7 mg/dL (ref 8.6–10.2)
GFR, Est African American: 84 mL/min/{1.73_m2} (ref >=60)
GFR, Est Non African American: 73 mL/min/{1.73_m2} (ref >=60)
GLUCOSE: 69 mg/dL (ref 65–99)
Globulin: 2.5 g/dL (calc) (ref 1.9–3.7)
Potassium: 4.3 mmol/L (ref 3.5–5.3)
Sodium: 140 mmol/L (ref 135–146)
Total Bilirubin: 0.4 mg/dL (ref 0.2–1.2)
Total Protein: 6.6 g/dL (ref 6.1–8.1)

## 2018-04-09 LAB — CBC WITH DIFFERENTIAL/PLATELET
BASOS PCT: 1.1 %
Basophils Absolute: 39 cells/uL (ref 0–200)
EOS PCT: 3.2 %
Eosinophils Absolute: 112 cells/uL (ref 15–500)
HCT: 40.4 % (ref 35.0–45.0)
HEMOGLOBIN: 13.6 g/dL (ref 11.7–15.5)
Lymphs Abs: 1806 cells/uL (ref 850–3900)
MCH: 31.3 pg (ref 27.0–33.0)
MCHC: 33.7 g/dL (ref 32.0–36.0)
MCV: 93.1 fL (ref 80.0–100.0)
MONOS PCT: 8.6 %
MPV: 10.7 fL (ref 7.5–12.5)
Neutro Abs: 1243 cells/uL — ABNORMAL LOW (ref 1500–7800)
Neutrophils Relative %: 35.5 %
PLATELETS: 309 10*3/uL (ref 140–400)
RBC: 4.34 10*6/uL (ref 3.80–5.10)
RDW: 12.2 % (ref 11.0–15.0)
TOTAL LYMPHOCYTE: 51.6 %
WBC mixed population: 301 cells/uL (ref 200–950)
WBC: 3.5 10*3/uL — AB (ref 3.8–10.8)

## 2018-04-09 LAB — TSH: TSH: 0.44 m[IU]/L

## 2018-04-09 MED ORDER — SAXENDA 18 MG/3ML ~~LOC~~ SOPN: 3.0000 mg | PEN_INJECTOR | Freq: Every day | SUBCUTANEOUS | 5 refills | Status: DC

## 2018-04-09 MED ORDER — INSULIN PEN NEEDLE 31G X 5 MM MISC: 5 refills | Status: DC

## 2018-04-09 NOTE — Progress Notes (Signed)
Check CBC in one month 

## 2018-04-09 NOTE — Assessment & Plan Note (Signed)
encourged fiber and water; check TSH; refer to GI for consideration of a colonoscopy

## 2018-04-09 NOTE — Assessment & Plan Note (Signed)
Patient will continue the Saxenda; encourgement given for adequate hydration, movement, etc
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
## 2018-04-09 NOTE — Patient Instructions (Addendum)
Let's get labs today Vitamin B12 250 or 500 mcg daily may help with energy in the afternoons if taken after lunch Let's get labs We'll have you see the GI doctor about getting a colonoscopy If you have not heard anything from my staff in a week about any orders/referrals/studies from today, please contact us here to follow-up [phone]

## 2018-04-10 ENCOUNTER — Ambulatory Visit: Payer: BC Managed Care – PPO | Admitting: Gastroenterology

## 2018-04-10 ENCOUNTER — Other Ambulatory Visit: Payer: Self-pay

## 2018-04-10 ENCOUNTER — Encounter: Payer: Self-pay | Admitting: Gastroenterology

## 2018-04-10 VITALS — BP 115/75 | HR 85 | Resp 17 | Ht 67.0 in | Wt 189.0 lb

## 2018-04-10 DIAGNOSIS — K5904 Chronic idiopathic constipation: Secondary | ICD-10-CM | POA: Diagnosis not present

## 2018-04-10 DIAGNOSIS — Z1211 Encounter for screening for malignant neoplasm of colon: Secondary | ICD-10-CM
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
## 2018-04-16 ENCOUNTER — Encounter: Payer: BC Managed Care – PPO | Admitting: Obstetrics and Gynecology
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
## 2018-04-22 ENCOUNTER — Ambulatory Visit: Payer: BC Managed Care – PPO | Admitting: Podiatry

## 2018-04-22 ENCOUNTER — Ambulatory Visit (INDEPENDENT_AMBULATORY_CARE_PROVIDER_SITE_OTHER): Payer: BC Managed Care – PPO

## 2018-04-22 ENCOUNTER — Encounter: Payer: Self-pay | Admitting: Podiatry

## 2018-04-22 VITALS — BP 107/69 | HR 96 | Resp 16

## 2018-04-22 DIAGNOSIS — M2011 Hallux valgus (acquired), right foot: Secondary | ICD-10-CM | POA: Diagnosis not present
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
## 2018-04-24 ENCOUNTER — Encounter: Payer: Self-pay | Admitting: Gastroenterology

## 2018-04-24 ENCOUNTER — Other Ambulatory Visit: Payer: Self-pay | Admitting: Gastroenterology

## 2018-04-24 DIAGNOSIS — K5904 Chronic idiopathic constipation: Secondary | ICD-10-CM

## 2018-04-24 MED ORDER — PLECANATIDE 3 MG PO TABS
3.0000 mg | ORAL_TABLET | Freq: Every day | ORAL | 0 refills | Status: DC
Start: 2018-04-24 — End: 2018-04-29

## 2018-04-29 ENCOUNTER — Encounter: Payer: Self-pay | Admitting: *Deleted

## 2018-04-29 ENCOUNTER — Ambulatory Visit
Admission: RE | Admit: 2018-04-29 | Discharge: 2018-04-29 | Disposition: A | Discharge status: Home / Self Care | Payer: BC Managed Care – PPO | Source: Ambulatory Visit | Attending: Gastroenterology | Admitting: Gastroenterology

## 2018-04-29 ENCOUNTER — Ambulatory Visit: Payer: BC Managed Care – PPO | Admitting: Anesthesiology

## 2018-04-29 ENCOUNTER — Encounter
Admission: RE | Disposition: A | Discharge status: Home / Self Care | Payer: Self-pay | Source: Ambulatory Visit | Attending: Gastroenterology

## 2018-04-29 ENCOUNTER — Other Ambulatory Visit: Payer: Self-pay

## 2018-04-29 DIAGNOSIS — I10 Essential (primary) hypertension: Secondary | ICD-10-CM | POA: Insufficient documentation

## 2018-04-29 DIAGNOSIS — K648 Other hemorrhoids: Secondary | ICD-10-CM | POA: Diagnosis not present

## 2018-04-29 DIAGNOSIS — K219 Gastro-esophageal reflux disease without esophagitis: Secondary | ICD-10-CM | POA: Insufficient documentation

## 2018-04-29 DIAGNOSIS — R7303 Prediabetes: Secondary | ICD-10-CM | POA: Insufficient documentation

## 2018-04-29 DIAGNOSIS — K5904 Chronic idiopathic constipation: Secondary | ICD-10-CM

## 2018-04-29 DIAGNOSIS — N3010 Interstitial cystitis (chronic) without hematuria: Secondary | ICD-10-CM | POA: Insufficient documentation

## 2018-04-29 DIAGNOSIS — Z800 Family history of malignant neoplasm of digestive organs: Secondary | ICD-10-CM | POA: Diagnosis not present

## 2018-04-29 DIAGNOSIS — F418 Other specified anxiety disorders: Secondary | ICD-10-CM | POA: Insufficient documentation

## 2018-04-29 DIAGNOSIS — Z1211 Encounter for screening for malignant neoplasm of colon: Secondary | ICD-10-CM | POA: Diagnosis present

## 2018-04-29 DIAGNOSIS — Z7951 Long term (current) use of inhaled steroids: Secondary | ICD-10-CM | POA: Insufficient documentation

## 2018-04-29 DIAGNOSIS — K589 Irritable bowel syndrome without diarrhea: Secondary | ICD-10-CM | POA: Diagnosis not present

## 2018-04-29 DIAGNOSIS — G43909 Migraine, unspecified, not intractable, without status migrainosus: Secondary | ICD-10-CM | POA: Diagnosis not present

## 2018-04-29 HISTORY — PX: COLONOSCOPY WITH PROPOFOL: SHX5780

## 2018-04-29 SURGERY — COLONOSCOPY WITH PROPOFOL
Anesthesia: General

## 2018-04-29 MED ORDER — PROPOFOL 10 MG/ML IV BOLUS
INTRAVENOUS | Status: DC | PRN
  Administered 2018-04-29: 20 mg via INTRAVENOUS
  Administered 2018-04-29: 100 mg via INTRAVENOUS

## 2018-04-29 MED ORDER — PROPOFOL 500 MG/50ML IV EMUL
INTRAVENOUS | Status: DC | PRN
  Administered 2018-04-29: 150 ug/kg/min via INTRAVENOUS

## 2018-04-29 MED ORDER — LIDOCAINE HCL (PF) 1 % IJ SOLN
2.0000 mL | Freq: Once | INTRAMUSCULAR | Status: AC
  Administered 2018-04-29: 0.3 mL via INTRADERMAL

## 2018-04-29 MED ORDER — PLECANATIDE 3 MG PO TABS: 3.0000 mg | ORAL_TABLET | Freq: Every day | ORAL | 0 refills | Status: DC

## 2018-04-29 MED ORDER — PROPOFOL 500 MG/50ML IV EMUL
INTRAVENOUS | Status: AC
  Filled 2018-04-29: qty 50

## 2018-04-29 MED ORDER — SODIUM CHLORIDE 0.9 % IV SOLN
INTRAVENOUS | Status: DC
  Administered 2018-04-29: 1000 mL via INTRAVENOUS

## 2018-04-29 MED ORDER — LIDOCAINE HCL (PF) 2 % IJ SOLN
INTRAMUSCULAR | Status: AC
  Filled 2018-04-29: qty 10

## 2018-04-29 MED ORDER — LIDOCAINE HCL (PF) 1 % IJ SOLN
INTRAMUSCULAR | Status: AC
  Administered 2018-04-29: 0.3 mL via INTRADERMAL
  Filled 2018-04-29: qty 2
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
## 2018-04-29 NOTE — Anesthesia Postprocedure Evaluation (Signed)
Anesthesia Post Note  Patient: Brittney Johns  Procedure(s) Performed: COLONOSCOPY WITH PROPOFOL (N/A )  Patient location during evaluation: Endoscopy Anesthesia Type: General Level of consciousness: awake and alert Pain management: pain level controlled Vital Signs Assessment: post-procedure vital signs reviewed and stable Respiratory status: spontaneous breathing, nonlabored ventilation, respiratory function stable and patient connected to nasal cannula oxygen Cardiovascular status: blood pressure returned to baseline and stable Postop Assessment: no apparent nausea or vomiting Anesthetic complications: no     Last Vitals:  Vitals:   04/29/18 1121 04/29/18 1127  BP: 117/86 117/86  Pulse:    Resp: 17 18  Temp:    SpO2:      Last Pain:  Vitals:   04/29/18 1050  TempSrc: Tympanic  PainSc:                  Cleda MccreedyJoseph K Piscitello

## 2018-04-29 NOTE — Op Note (Addendum)
[facility] Gastroenterology Patient Name: Brittney Johns Procedure Date: 04/29/2018 10:22 AM MRN: [id number] Account #: [id number] Date of Birth: [birth year] Admit Type: Outpatient Age: 47 Room: [facility] ENDO ROOM 2 Gender: Female Note Status: Finalized Procedure:            Colonoscopy Indications:          Screening in patient at increased risk: Rectal cancer                        in brother at age 75, This is the patient's first                        colonoscopy Providers:            Lin Landsman MD, MD Referring MD:         Dwana Curd. Marzetta Board (Referring MD) Medicines:            Monitored Anesthesia Care Complications:        No immediate complications. Estimated blood loss: None. Procedure:            Pre-Anesthesia Assessment:                       - Prior to the procedure, a History and Physical was                        performed, and patient medications and allergies were                        reviewed. The patient is competent. The risks and                        benefits of the procedure and the sedation options and                        risks were discussed with the patient. All questions                        were answered and informed consent was obtained.                        Patient identification and proposed procedure were                        verified by the physician, the nurse, the                        anesthesiologist, the anesthetist and the technician in                        the pre-procedure area in the procedure room in the                        endoscopy suite. Mental Status Examination: alert and                        oriented. Airway Examination: normal oropharyngeal                        airway and neck mobility. Respiratory Examination:  clear to auscultation. CV Examination: normal.                        Prophylactic Antibiotics: The patient does not require                         prophylactic antibiotics. Prior Anticoagulants: The                        patient has taken no previous anticoagulant or                        antiplatelet agents. ASA Grade Assessment: II - A                        patient with mild systemic disease. After reviewing the                        risks and benefits, the patient was deemed in                        satisfactory condition to undergo the procedure. The                        anesthesia plan was to use monitored anesthesia care                        (MAC). Immediately prior to administration of                        medications, the patient was re-assessed for adequacy                        to receive sedatives. The heart rate, respiratory rate,                        oxygen saturations, blood pressure, adequacy of                        pulmonary ventilation, and response to care were                        monitored throughout the procedure. The physical status                        of the patient was re-assessed after the procedure.                       After obtaining informed consent, the colonoscope was                        passed under direct vision. Throughout the procedure,                        the patient's blood pressure, pulse, and oxygen                        saturations were monitored continuously. The                        Colonoscope  was introduced through the anus and                        advanced to the the cecum, identified by appendiceal                        orifice and ileocecal valve. The colonoscopy was                        performed without difficulty. The patient tolerated the                        procedure well. The quality of the bowel preparation                        was evaluated using the BBPS [facility] Bowel Preparation                        Scale) with scores of: Right Colon = 3, Transverse                        Colon = 3 and Left Colon = 3 (entire mucosa seen well                         with no residual staining, small fragments of stool or                        opaque liquid). The total BBPS score equals 9. Findings:      The perianal and digital rectal examinations were normal. Pertinent       negatives include normal sphincter tone and no palpable rectal lesions.      The colon (entire examined portion) appeared normal.      External and internal hemorrhoids were found during retroflexion. The       hemorrhoids were small. Impression:           - The entire examined colon is normal.                       - External and internal hemorrhoids.                       - No specimens collected. Recommendation:       - Discharge patient to home (with escort).                       - Resume regular diet today.                       - Continue present medications.                       - Repeat colonoscopy in 5 years for surveillance.                       - Return to my office as previously scheduled. Procedure Code(s):    --- Professional ---                       W4315, Colorectal cancer screening; colonoscopy on  individual at high risk Diagnosis Code(s):    --- Professional ---                       Z80.0, Family history of malignant neoplasm of                        digestive organs                       K64.8, Other hemorrhoids CPT copyright 2017 American Medical Association. All rights reserved. The codes documented in this report are preliminary and upon coder review may  be revised to meet current compliance requirements. Dr. Ulyess Mort Lin Landsman MD, MD 04/29/2018 10:55:52 AM This report has been signed electronically. Number of Addenda: 0 Note Initiated On: 04/29/2018 10:22 AM Scope Withdrawal Time: 0 hours 8 minutes 49 seconds  Total Procedure Duration: 0 hours 14 minutes 44 seconds       [facility]
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
## 2018-04-29 NOTE — Transfer of Care (Signed)
Immediate Anesthesia Transfer of Care Note  Patient: Brittney Johns  Procedure(s) Performed: COLONOSCOPY WITH PROPOFOL (N/A )  Patient Location: Endoscopy Unit  Anesthesia Type:General  Level of Consciousness: drowsy and cooperative  Airway & Oxygen Therapy: Patient Spontanous Breathing and Patient connected to nasal cannula oxygen  Post-op Assessment: Report given to RN and Post -op Vital signs reviewed and stable  Post vital signs: Reviewed and stable  Last Vitals:  Vitals Value Taken Time  BP 103/66 04/29/2018 11:00 AM  Temp 35.6 C 04/29/2018 10:50 AM  Pulse 85 04/29/2018 11:02 AM  Resp 14 04/29/2018 11:02 AM  SpO2 100 % 04/29/2018 11:02 AM  Vitals shown include unvalidated device data.  Last Pain:  Vitals:   04/29/18 1050  TempSrc: Tympanic  PainSc:          Complications: No apparent anesthesia complications

## 2018-04-29 NOTE — Anesthesia Post-op Follow-up Note (Signed)
Anesthesia QCDR form completed.        

## 2018-04-30 ENCOUNTER — Encounter: Payer: Self-pay | Admitting: Obstetrics and Gynecology

## 2018-04-30 ENCOUNTER — Ambulatory Visit (INDEPENDENT_AMBULATORY_CARE_PROVIDER_SITE_OTHER): Payer: BC Managed Care – PPO | Admitting: Obstetrics and Gynecology

## 2018-04-30 VITALS — BP 104/74 | HR 90 | Ht 67.0 in | Wt 186.7 lb

## 2018-04-30 DIAGNOSIS — Z1239 Encounter for other screening for malignant neoplasm of breast: Secondary | ICD-10-CM

## 2018-04-30 DIAGNOSIS — Z01419 Encounter for gynecological examination (general) (routine) without abnormal findings: Secondary | ICD-10-CM

## 2018-04-30 DIAGNOSIS — N393 Stress incontinence (female) (male): Secondary | ICD-10-CM

## 2018-04-30 DIAGNOSIS — E8940 Asymptomatic postprocedural ovarian failure: Secondary | ICD-10-CM

## 2018-04-30 DIAGNOSIS — N39490 Overflow incontinence: Secondary | ICD-10-CM

## 2018-04-30 DIAGNOSIS — Z800 Family history of malignant neoplasm of digestive organs: Secondary | ICD-10-CM

## 2018-04-30 DIAGNOSIS — F419 Anxiety disorder, unspecified: Secondary | ICD-10-CM

## 2018-04-30 DIAGNOSIS — Z1231 Encounter for screening mammogram for malignant neoplasm of breast: Secondary | ICD-10-CM

## 2018-04-30 DIAGNOSIS — Z90710 Acquired absence of both cervix and uterus: Secondary | ICD-10-CM

## 2018-04-30 DIAGNOSIS — E663 Overweight: Secondary | ICD-10-CM

## 2018-04-30 MED ORDER — ESTRADIOL 1 MG PO TABS: 1.5000 mg | ORAL_TABLET | Freq: Every day | ORAL | 3 refills | Status: DC

## 2018-04-30 NOTE — Patient Instructions (Signed)
1.  No Pap smear is done.  No Pap smears are needed 2.  Mammogram is ordered 3.  Screening coloscopy anoscopy was done last week 4.  Screening labs are ordered 5.  Continue with healthy eating and exercise with controlled weight loss 6.  Referral to physical therapy for stress incontinence 7.  Continue with calcium and vitamin D supplementation twice a day 8.  Refill estradiol 1.5 mg daily 9.  Return in 1 year for annual exam   Health Maintenance for Postmenopausal Women Menopause is a normal process in which your reproductive ability comes to an end. This process happens gradually over a span of months to years, usually between the ages of 51 and 75. Menopause is complete when you have missed 12 consecutive menstrual periods. It is important to talk with your health care provider about some of the most common conditions that affect postmenopausal women, such as heart disease, cancer, and bone loss (osteoporosis). Adopting a healthy lifestyle and getting preventive care can help to promote your health and wellness. Those actions can also lower your chances of developing some of these common conditions. What should I know about menopause? During menopause, you may experience a number of symptoms, such as:  Moderate-to-severe hot flashes.  Night sweats.  Decrease in sex drive.  Mood swings.  Headaches.  Tiredness.  Irritability.  Memory problems.  Insomnia.  Choosing to treat or not to treat menopausal changes is an individual decision that you make with your health care provider. What should I know about hormone replacement therapy and supplements? Hormone therapy products are effective for treating symptoms that are associated with menopause, such as hot flashes and night sweats. Hormone replacement carries certain risks, especially as you become older. If you are thinking about using estrogen or estrogen with progestin treatments, discuss the benefits and risks with your health  care provider. What should I know about heart disease and stroke? Heart disease, heart attack, and stroke become more likely as you age. This may be due, in part, to the hormonal changes that your body experiences during menopause. These can affect how your body processes dietary fats, triglycerides, and cholesterol. Heart attack and stroke are both medical emergencies. There are many things that you can do to help prevent heart disease and stroke:  Have your blood pressure checked at least every 1-2 years. High blood pressure causes heart disease and increases the risk of stroke.  If you are 55-56 years old, ask your health care provider if you should take aspirin to prevent a heart attack or a stroke.  Do not use any tobacco products, including cigarettes, chewing tobacco, or electronic cigarettes. If you need help quitting, ask your health care provider.  It is important to eat a healthy diet and maintain a healthy weight. ? Be sure to include plenty of vegetables, fruits, low-fat dairy products, and lean protein. ? Avoid eating foods that are high in solid fats, added sugars, or salt (sodium).  Get regular exercise. This is one of the most important things that you can do for your health. ? Try to exercise for at least 150 minutes each week. The type of exercise that you do should increase your heart rate and make you sweat. This is known as moderate-intensity exercise. ? Try to do strengthening exercises at least twice each week. Do these in addition to the moderate-intensity exercise.  Know your numbers.Ask your health care provider to check your cholesterol and your blood glucose. Continue to have your  blood tested as directed by your health care provider.  What should I know about cancer screening? There are several types of cancer. Take the following steps to reduce your risk and to catch any cancer development as early as possible. Breast Cancer  Practice breast  self-awareness. ? This means understanding how your breasts normally appear and feel. ? It also means doing regular breast self-exams. Let your health care provider know about any changes, no matter how small.  If you are 109 or older, have a clinician do a breast exam (clinical breast exam or CBE) every year. Depending on your age, family history, and medical history, it may be recommended that you also have a yearly breast X-ray (mammogram).  If you have a family history of breast cancer, talk with your health care provider about genetic screening.  If you are at high risk for breast cancer, talk with your health care provider about having an MRI and a mammogram every year.  Breast cancer (BRCA) gene test is recommended for women who have family members with BRCA-related cancers. Results of the assessment will determine the need for genetic counseling and BRCA1 and for BRCA2 testing. BRCA-related cancers include these types: ? Breast. This occurs in males or females. ? Ovarian. ? Tubal. This may also be called fallopian tube cancer. ? Cancer of the abdominal or pelvic lining (peritoneal cancer). ? Prostate. ? Pancreatic.  Cervical, Uterine, and Ovarian Cancer Your health care provider may recommend that you be screened regularly for cancer of the pelvic organs. These include your ovaries, uterus, and vagina. This screening involves a pelvic exam, which includes checking for microscopic changes to the surface of your cervix (Pap test).  For women ages 21-65, health care providers may recommend a pelvic exam and a Pap test every three years. For women ages 16-65, they may recommend the Pap test and pelvic exam, combined with testing for human papilloma virus (HPV), every five years. Some types of HPV increase your risk of cervical cancer. Testing for HPV may also be done on women of any age who have unclear Pap test results.  Other health care providers may not recommend any screening for  nonpregnant women who are considered low risk for pelvic cancer and have no symptoms. Ask your health care provider if a screening pelvic exam is right for you.  If you have had past treatment for cervical cancer or a condition that could lead to cancer, you need Pap tests and screening for cancer for at least 20 years after your treatment. If Pap tests have been discontinued for you, your risk factors (such as having a new sexual partner) need to be reassessed to determine if you should start having screenings again. Some women have medical problems that increase the chance of getting cervical cancer. In these cases, your health care provider may recommend that you have screening and Pap tests more often.  If you have a family history of uterine cancer or ovarian cancer, talk with your health care provider about genetic screening.  If you have vaginal bleeding after reaching menopause, tell your health care provider.  There are currently no reliable tests available to screen for ovarian cancer.  Lung Cancer Lung cancer screening is recommended for adults 46-28 years old who are at high risk for lung cancer because of a history of smoking. A yearly low-dose CT scan of the lungs is recommended if you:  Currently smoke.  Have a history of at least 30 pack-years of smoking  and you currently smoke or have quit within the past 15 years. A pack-year is smoking an average of one pack of cigarettes per day for one year.  Yearly screening should:  Continue until it has been 15 years since you quit.  Stop if you develop a health problem that would prevent you from having lung cancer treatment.  Colorectal Cancer  This type of cancer can be detected and can often be prevented.  Routine colorectal cancer screening usually begins at age 1 and continues through age 57.  If you have risk factors for colon cancer, your health care provider may recommend that you be screened at an earlier age.  If you  have a family history of colorectal cancer, talk with your health care provider about genetic screening.  Your health care provider may also recommend using home test kits to check for hidden blood in your stool.  A small camera at the end of a tube can be used to examine your colon directly (sigmoidoscopy or colonoscopy). This is done to check for the earliest forms of colorectal cancer.  Direct examination of the colon should be repeated every 5-10 years until age 73. However, if early forms of precancerous polyps or small growths are found or if you have a family history or genetic risk for colorectal cancer, you may need to be screened more often.  Skin Cancer  Check your skin from head to toe regularly.  Monitor any moles. Be sure to tell your health care provider: ? About any new moles or changes in moles, especially if there is a change in a mole's shape or color. ? If you have a mole that is larger than the size of a pencil eraser.  If any of your family members has a history of skin cancer, especially at a young age, talk with your health care provider about genetic screening.  Always use sunscreen. Apply sunscreen liberally and repeatedly throughout the day.  Whenever you are outside, protect yourself by wearing long sleeves, pants, a wide-brimmed hat, and sunglasses.  What should I know about osteoporosis? Osteoporosis is a condition in which bone destruction happens more quickly than new bone creation. After menopause, you may be at an increased risk for osteoporosis. To help prevent osteoporosis or the bone fractures that can happen because of osteoporosis, the following is recommended:  If you are 20-29 years old, get at least 1,000 mg of calcium and at least 600 mg of vitamin D per day.  If you are older than age 47 but younger than age 40, get at least 1,200 mg of calcium and at least 600 mg of vitamin D per day.  If you are older than age 27, get at least 1,200 mg of  calcium and at least 800 mg of vitamin D per day.  Smoking and excessive alcohol intake increase the risk of osteoporosis. Eat foods that are rich in calcium and vitamin D, and do weight-bearing exercises several times each week as directed by your health care provider. What should I know about how menopause affects my mental health? Depression may occur at any age, but it is more common as you become older. Common symptoms of depression include:  Low or sad mood.  Changes in sleep patterns.  Changes in appetite or eating patterns.  Feeling an overall lack of motivation or enjoyment of activities that you previously enjoyed.  Frequent crying spells.  Talk with your health care provider if you think that you are experiencing  depression. What should I know about immunizations? It is important that you get and maintain your immunizations. These include:  Tetanus, diphtheria, and pertussis (Tdap) booster vaccine.  Influenza every year before the flu season begins.  Pneumonia vaccine.  Shingles vaccine.  Your health care provider may also recommend other immunizations. This information is not intended to replace advice given to you by your health care provider. Make sure you discuss any questions you have with your health care provider. Document Released: 10/27/2005 Document Revised: 03/24/2016 Document Reviewed: 06/08/2015 Elsevier Interactive Patient Education  2018 [employer].

## 2018-05-01 LAB — LIPID PANEL
CHOL/HDL RATIO: 2.3 ratio (ref 0.0–4.4)
Cholesterol, Total: 191 mg/dL (ref 100–199)
HDL: 82 mg/dL (ref >39)
LDL CALC: 99 mg/dL (ref 0–99)
TRIGLYCERIDES: 51 mg/dL (ref 0–149)
VLDL Cholesterol Cal: 10 mg/dL (ref 5–40)

## 2018-05-01 LAB — HEMOGLOBIN A1C
Est. average glucose Bld gHb Est-mCnc: 105 mg/dL
Hgb A1c MFr Bld: 5.3 % (ref 4.8–5.6)

## 2018-05-07 ENCOUNTER — Encounter: Payer: Self-pay | Admitting: Gastroenterology

## 2018-05-15 ENCOUNTER — Ambulatory Visit: Payer: BC Managed Care – PPO | Admitting: Gastroenterology

## 2018-05-15 ENCOUNTER — Telehealth: Payer: Self-pay | Admitting: Gastroenterology

## 2018-05-15 ENCOUNTER — Encounter: Payer: Self-pay | Admitting: Gastroenterology

## 2018-05-15 VITALS — BP 117/81 | HR 83 | Resp 17 | Ht 67.0 in | Wt 189.2 lb

## 2018-05-15 DIAGNOSIS — K5909 Other constipation: Secondary | ICD-10-CM | POA: Diagnosis not present
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
## 2018-05-15 NOTE — Telephone Encounter (Signed)
Trulance it is still $1300 w/ coupon she will still pay $600. Much too expensive, please call patient.

## 2018-05-21 NOTE — Telephone Encounter (Signed)
LVM asking pt to return call 

## 2018-05-21 NOTE — Telephone Encounter (Signed)
Pt states Trulance is going to cost her $1300 w/coupon she will have to pay $600, and it is too expensive for patient. Please advise    Thanks  TG

## 2018-05-22 ENCOUNTER — Other Ambulatory Visit: Payer: Self-pay

## 2018-05-22 ENCOUNTER — Ambulatory Visit: Payer: BC Managed Care – PPO | Attending: Obstetrics and Gynecology

## 2018-05-22 DIAGNOSIS — M791 Myalgia, unspecified site: Secondary | ICD-10-CM | POA: Diagnosis present

## 2018-05-22 DIAGNOSIS — N3946 Mixed incontinence: Secondary | ICD-10-CM | POA: Diagnosis present

## 2018-05-22 DIAGNOSIS — K5902 Outlet dysfunction constipation: Secondary | ICD-10-CM | POA: Insufficient documentation

## 2018-05-22 DIAGNOSIS — M62838 Other muscle spasm: Secondary | ICD-10-CM | POA: Diagnosis not present
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
## 2018-05-22 NOTE — Patient Instructions (Addendum)
    Stabilization: Diaphragmatic Breathing    Lie with knees bent, feet flat. Place one hand on stomach, other on chest. Breathe deeply through nose, lifting belly hand without any motion of hand on chest. Do this 3-5 min. Per night. You can use a heavy book on your abdomen to help give you feedback. The book should rise as you inhale.

## 2018-05-28 ENCOUNTER — Other Ambulatory Visit: Payer: Self-pay | Admitting: Nurse Practitioner

## 2018-05-30 ENCOUNTER — Ambulatory Visit: Payer: BC Managed Care – PPO

## 2018-05-30 DIAGNOSIS — K5902 Outlet dysfunction constipation: Secondary | ICD-10-CM

## 2018-05-30 DIAGNOSIS — M791 Myalgia, unspecified site: Secondary | ICD-10-CM

## 2018-05-30 DIAGNOSIS — M62838 Other muscle spasm: Secondary | ICD-10-CM | POA: Diagnosis not present

## 2018-05-30 DIAGNOSIS — N3946 Mixed incontinence: Secondary | ICD-10-CM
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
## 2018-06-05 ENCOUNTER — Ambulatory Visit: Payer: BC Managed Care – PPO

## 2018-06-05 DIAGNOSIS — M62838 Other muscle spasm: Secondary | ICD-10-CM | POA: Diagnosis not present

## 2018-06-05 DIAGNOSIS — N3946 Mixed incontinence: Secondary | ICD-10-CM

## 2018-06-05 DIAGNOSIS — K5902 Outlet dysfunction constipation: Secondary | ICD-10-CM

## 2018-06-05 DIAGNOSIS — M791 Myalgia, unspecified site: Secondary | ICD-10-CM

## 2018-06-05 NOTE — Patient Instructions (Signed)
    Hold stretch for 5 deep breaths, repeat 2-3 times on each side. Do this at least once per day.
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
## 2018-06-27 ENCOUNTER — Ambulatory Visit: Payer: BC Managed Care – PPO | Attending: Obstetrics and Gynecology

## 2018-06-27 DIAGNOSIS — K5902 Outlet dysfunction constipation: Secondary | ICD-10-CM | POA: Diagnosis present

## 2018-06-27 DIAGNOSIS — M62838 Other muscle spasm: Secondary | ICD-10-CM

## 2018-06-27 DIAGNOSIS — N3946 Mixed incontinence: Secondary | ICD-10-CM | POA: Insufficient documentation

## 2018-06-27 DIAGNOSIS — M791 Myalgia, unspecified site: Secondary | ICD-10-CM | POA: Diagnosis present
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
## 2018-06-27 NOTE — Patient Instructions (Signed)
    Inhale and slide the hand forward a little, exhale and feel the stretch/twist through your spine as you come back. Do 2x15, 1-2 times per day. (especially focus on R side up)  Bottom leg straight.

## 2018-07-03 ENCOUNTER — Ambulatory Visit: Payer: BC Managed Care – PPO

## 2018-07-10 ENCOUNTER — Ambulatory Visit: Payer: BC Managed Care – PPO

## 2018-07-16 ENCOUNTER — Ambulatory Visit: Payer: BC Managed Care – PPO | Admitting: Family Medicine

## 2018-07-17 ENCOUNTER — Ambulatory Visit: Payer: BC Managed Care – PPO

## 2018-07-17 DIAGNOSIS — M62838 Other muscle spasm: Secondary | ICD-10-CM | POA: Diagnosis not present

## 2018-07-17 DIAGNOSIS — K5902 Outlet dysfunction constipation: Secondary | ICD-10-CM

## 2018-07-17 DIAGNOSIS — M791 Myalgia, unspecified site: Secondary | ICD-10-CM

## 2018-07-17 DIAGNOSIS — N3946 Mixed incontinence: Secondary | ICD-10-CM
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
## 2018-07-24 ENCOUNTER — Ambulatory Visit: Payer: BC Managed Care – PPO | Attending: Obstetrics and Gynecology

## 2018-07-24 DIAGNOSIS — M791 Myalgia, unspecified site: Secondary | ICD-10-CM | POA: Insufficient documentation

## 2018-07-24 DIAGNOSIS — N3946 Mixed incontinence: Secondary | ICD-10-CM | POA: Insufficient documentation

## 2018-07-24 DIAGNOSIS — K5902 Outlet dysfunction constipation: Secondary | ICD-10-CM | POA: Insufficient documentation

## 2018-07-24 DIAGNOSIS — M62838 Other muscle spasm: Secondary | ICD-10-CM | POA: Insufficient documentation

## 2018-07-25 ENCOUNTER — Other Ambulatory Visit: Payer: Self-pay

## 2018-07-25 ENCOUNTER — Ambulatory Visit: Payer: BC Managed Care – PPO | Admitting: Family Medicine

## 2018-07-25 ENCOUNTER — Encounter: Payer: Self-pay | Admitting: Family Medicine

## 2018-07-25 ENCOUNTER — Emergency Department: Payer: BC Managed Care – PPO

## 2018-07-25 ENCOUNTER — Emergency Department
Admission: EM | Admit: 2018-07-25 | Discharge: 2018-07-25 | Disposition: A | Discharge status: Home / Self Care | Payer: BC Managed Care – PPO | Attending: Emergency Medicine | Admitting: Emergency Medicine

## 2018-07-25 VITALS — BP 110/72 | HR 115 | Temp 98.1°F | Ht 67.0 in | Wt 190.5 lb

## 2018-07-25 DIAGNOSIS — R7303 Prediabetes: Secondary | ICD-10-CM | POA: Diagnosis not present

## 2018-07-25 DIAGNOSIS — R0789 Other chest pain: Secondary | ICD-10-CM | POA: Insufficient documentation

## 2018-07-25 DIAGNOSIS — Z794 Long term (current) use of insulin: Secondary | ICD-10-CM | POA: Diagnosis not present

## 2018-07-25 DIAGNOSIS — Z79899 Other long term (current) drug therapy: Secondary | ICD-10-CM | POA: Insufficient documentation

## 2018-07-25 DIAGNOSIS — R079 Chest pain, unspecified: Secondary | ICD-10-CM | POA: Diagnosis not present

## 2018-07-25 DIAGNOSIS — R7301 Impaired fasting glucose: Secondary | ICD-10-CM | POA: Insufficient documentation

## 2018-07-25 DIAGNOSIS — R000 Tachycardia, unspecified: Secondary | ICD-10-CM

## 2018-07-25 DIAGNOSIS — I10 Essential (primary) hypertension: Secondary | ICD-10-CM | POA: Insufficient documentation

## 2018-07-25 LAB — CBC WITH DIFFERENTIAL/PLATELET
Abs Immature Granulocytes: 0.02 10*3/uL (ref 0.00–0.07)
BASOS ABS: 0.1 10*3/uL (ref 0.0–0.1)
Basophils Relative: 1 %
EOS ABS: 0.2 10*3/uL (ref 0.0–0.5)
Eosinophils Relative: 3 %
HCT: 37.5 % (ref 36.0–46.0)
Hemoglobin: 12.5 g/dL (ref 12.0–15.0)
Immature Granulocytes: 0 %
Lymphocytes Relative: 38 %
Lymphs Abs: 2.1 10*3/uL (ref 0.7–4.0)
MCH: 31.3 pg (ref 26.0–34.0)
MCHC: 33.3 g/dL (ref 30.0–36.0)
MCV: 93.8 fL (ref 80.0–100.0)
MONO ABS: 0.4 10*3/uL (ref 0.1–1.0)
Monocytes Relative: 8 %
NRBC: 0 % (ref 0.0–0.2)
Neutro Abs: 2.8 10*3/uL (ref 1.7–7.7)
Neutrophils Relative %: 50 %
Platelets: 318 10*3/uL (ref 150–400)
RBC: 4 MIL/uL (ref 3.87–5.11)
RDW: 13.1 % (ref 11.5–15.5)
WBC: 5.6 10*3/uL (ref 4.0–10.5)

## 2018-07-25 LAB — BASIC METABOLIC PANEL
Anion gap: 5 (ref 5–15)
BUN: 14 mg/dL (ref 6–20)
CALCIUM: 9.1 mg/dL (ref 8.9–10.3)
CHLORIDE: 109 mmol/L (ref 98–111)
CO2: 25 mmol/L (ref 22–32)
Creatinine, Ser: 0.91 mg/dL (ref 0.44–1.00)
GFR calc Af Amer: >60 mL/min (ref >60)
Glucose, Bld: 91 mg/dL (ref 70–99)
Potassium: 3.7 mmol/L (ref 3.5–5.1)
Sodium: 139 mmol/L (ref 135–145)

## 2018-07-25 LAB — FIBRIN DERIVATIVES D-DIMER (ARMC ONLY): FIBRIN DERIVATIVES D-DIMER (ARMC): 379.76 ng{FEU}/mL (ref 0.00–499.00)

## 2018-07-25 LAB — TROPONIN I

## 2018-07-25 MED ORDER — KETOROLAC TROMETHAMINE 30 MG/ML IJ SOLN
30.0000 mg | Freq: Once | INTRAMUSCULAR | Status: AC
  Administered 2018-07-25: 30 mg via INTRAVENOUS
  Filled 2018-07-25: qty 1

## 2018-07-25 MED ORDER — METOCLOPRAMIDE HCL 5 MG/ML IJ SOLN
10.0000 mg | Freq: Once | INTRAMUSCULAR | Status: AC
  Administered 2018-07-25: 10 mg via INTRAVENOUS
  Filled 2018-07-25: qty 2

## 2018-07-25 NOTE — Discharge Instructions (Addendum)
You can continue to take ibuprofen, 600 mg every 6 hours with food in order to help with your pain.  Follow-up with your primary doctor as instructed.  Return to the ER for new, worsening, persistent severe chest pain, severe headache, difficulty breathing, lightheadedness, or any other new or worsening symptoms that concern you.
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
## 2018-07-25 NOTE — Patient Instructions (Signed)
We'll have you get checked out at the ER
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
## 2018-07-25 NOTE — ED Notes (Signed)
Patient transported to X-ray 

## 2018-07-25 NOTE — ED Triage Notes (Signed)
Pt to ED via EMS. Per ems pt from [facility] where pt told staff she was having right sided throbbing chest pain 4/10. Pt states chest pain started last Thursday with bettering symptoms today. Denies SOB, Dizzy. Pt states she's also had migraine since last Thursday. VSS. NAD.

## 2018-07-25 NOTE — ED Notes (Signed)
Pt alert and oriented X4, active, cooperative, pt in NAD. RR even and unlabored, color WNL.  Discharge and followup instructions reviewed. Ambulates safely. Pt informed to return if any life threatening symptoms occur.

## 2018-07-31 ENCOUNTER — Ambulatory Visit: Payer: BC Managed Care – PPO

## 2018-07-31 DIAGNOSIS — M62838 Other muscle spasm: Secondary | ICD-10-CM | POA: Diagnosis not present

## 2018-07-31 DIAGNOSIS — K5902 Outlet dysfunction constipation: Secondary | ICD-10-CM | POA: Diagnosis present

## 2018-07-31 DIAGNOSIS — N3946 Mixed incontinence: Secondary | ICD-10-CM

## 2018-07-31 DIAGNOSIS — M791 Myalgia, unspecified site: Secondary | ICD-10-CM | POA: Diagnosis present
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
## 2018-08-07 ENCOUNTER — Encounter: Payer: Self-pay | Admitting: Obstetrics and Gynecology

## 2018-08-07 ENCOUNTER — Ambulatory Visit: Payer: BC Managed Care – PPO | Admitting: Obstetrics and Gynecology

## 2018-08-07 VITALS — BP 109/78 | Ht 67.0 in | Wt 189.1 lb

## 2018-08-07 DIAGNOSIS — Z90710 Acquired absence of both cervix and uterus: Secondary | ICD-10-CM | POA: Diagnosis not present

## 2018-08-07 DIAGNOSIS — B373 Candidiasis of vulva and vagina: Secondary | ICD-10-CM

## 2018-08-07 DIAGNOSIS — R300 Dysuria: Secondary | ICD-10-CM | POA: Diagnosis not present

## 2018-08-07 DIAGNOSIS — E8940 Asymptomatic postprocedural ovarian failure: Secondary | ICD-10-CM | POA: Diagnosis not present

## 2018-08-07 DIAGNOSIS — B3731 Acute candidiasis of vulva and vagina: Secondary | ICD-10-CM

## 2018-08-07 DIAGNOSIS — N898 Other specified noninflammatory disorders of vagina: Secondary | ICD-10-CM

## 2018-08-07 LAB — POCT URINALYSIS DIPSTICK
BILIRUBIN UA: NEGATIVE
GLUCOSE UA: NEGATIVE
KETONES UA: NEGATIVE
Leukocytes, UA: NEGATIVE
Nitrite, UA: NEGATIVE
Protein, UA: NEGATIVE
Spec Grav, UA: 1.01 (ref 1.010–1.025)
Urobilinogen, UA: 0.2 E.U./dL
pH, UA: 6 (ref 5.0–8.0)

## 2018-08-07 MED ORDER — TERCONAZOLE 0.8 % VA CREA
1.0000 | TOPICAL_CREAM | Freq: Every day | VAGINAL | 0 refills | Status: DC
Start: 2018-08-07 — End: 2018-09-09

## 2018-08-07 MED ORDER — ESTRADIOL 1 MG PO TABS: 1.5000 mg | ORAL_TABLET | Freq: Every day | ORAL | 3 refills | Status: DC

## 2018-08-07 MED ORDER — FLUCONAZOLE 150 MG PO TABS: 150.0000 mg | ORAL_TABLET | ORAL | 1 refills | Status: DC

## 2018-08-07 NOTE — Patient Instructions (Signed)
1.  Terazol 3 cream intravaginal nightly x3 days. 2.  Diflucan 150 mg orally weekly x4 to help suppress recurrent yeast infections 3.  Recommend switching vaginal lubricant to: Jo H2O lubricant 4.  Recommend optimizing diet and controlling blood sugar to lower hemoglobin A1c 5.  Return in August for annual exam with Dr. Logan BoresEvans

## 2018-08-08 NOTE — Progress Notes (Signed)
Chief complaint: 1.  Recurrent yeast infection 2.  Vaginal discharge x1 week 3.  Vulvovaginal burning  Marcelino DusterMichelle presents for acute evaluation of the above symptoms.  She states that it appears that she gets a yeast infection almost every time she has relations with her spouse.  They are using Astroglide lubricant.  They are not using condoms.  She does notes that her husband tends to get these microtears in his penis with some associated bleeding; semen appears to have blood mixed with the secretions.  He states that this has been ongoing for years, including when he was married previously.  He has never had this evaluated.  Marcelino DusterMichelle reports no past history of HSV; She does not know of her husband's history.  Marcelino DusterMichelle did use Monistat last week, but no medication within the past 72 hours.  She continues to have this symptomatology.  Past medical history, past surgical history, problem list, medications, and allergies are reviewed History of endometriosis. Status post vaginal hysterectomy History of prediabetes History of chronic interstitial cystitis  Review of systems: Complete review of systems is positive for that noted in the HPI  OBJECTIVE: BP 109/78   Ht 5\' 7"  (1.702 m)   Wt 189 lb 1.6 oz (85.8 kg)   BMI 29.62 kg/m  Pleasant well-appearing female no acute distress.  Alert and oriented. Abdomen: Soft, nontender without organomegaly Bladder: Nontender Pelvic: External genitalia-normal BUS-normal Vagina-minimal white secretions in the vaginal vault; no lesions/no ulcerations Cervix-surgically absent Uterus-surgically absent Bimanual-no palpable mass or tenderness Rectovaginal-normal external exam  PROCEDURE: Normal saline-few white blood cells; no clue cells; no trichomonas KOH-branching Hyphae  ASSESSMENT: 1.  Recurrent yeast infection, confirmed with wet prep 2.  Husband with chronic microtears of penis associated with some bleeding during intercourse, unclear  etiology 3.  History of endometriosis, status post TVH and bilateral oophorectomy 4.  Surgical menopause asymptomatic on medication-estradiol 1 mg daily  PLAN: 1.  Terazol 3 cream 2.  Diflucan 150 mg orally weekly x4 3.  Recommend spouse to get urology evaluation for penis bleeding symptoms associated with intercourse. 4.  Optimize healthy eating and sugar control 5.  Return in August 2020 for annual exam-Dr. Logan BoresEvans. 6.  Change lubricant to Jo H2O lubricant for vaginal dryness 7.  Wet prep done with results confirming monilia vaginitis  A total of 15 minutes were spent face-to-face with the patient during this encounter and over half of that time dealt with counseling and coordination of care.  Herold HarmsMartin A Amarien Carne, MD  Note: This dictation was prepared with Dragon dictation along with smaller phrase technology. Any transcriptional errors that result from this process are unintentional.

## 2018-08-09 LAB — URINE CULTURE: ORGANISM ID, BACTERIA: NO GROWTH

## 2018-08-14 ENCOUNTER — Ambulatory Visit: Payer: BC Managed Care – PPO

## 2018-08-19 ENCOUNTER — Ambulatory Visit: Payer: BC Managed Care – PPO | Admitting: Gastroenterology

## 2018-08-27 ENCOUNTER — Ambulatory Visit: Payer: BC Managed Care – PPO | Attending: Obstetrics and Gynecology

## 2018-08-27 DIAGNOSIS — N3946 Mixed incontinence: Secondary | ICD-10-CM | POA: Diagnosis present

## 2018-08-27 DIAGNOSIS — M791 Myalgia, unspecified site: Secondary | ICD-10-CM | POA: Diagnosis present

## 2018-08-27 DIAGNOSIS — K5902 Outlet dysfunction constipation: Secondary | ICD-10-CM | POA: Diagnosis present

## 2018-08-27 DIAGNOSIS — M62838 Other muscle spasm: Secondary | ICD-10-CM | POA: Insufficient documentation

## 2018-08-27 NOTE — Addendum Note (Signed)
Addended by: Flora LippsGAILES, KEELI T on: 08/27/2018 09:20 AM   Modules accepted: Orders
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
## 2018-09-09 ENCOUNTER — Ambulatory Visit: Payer: BC Managed Care – PPO | Admitting: Family Medicine

## 2018-09-09 ENCOUNTER — Encounter: Payer: Self-pay | Admitting: Family Medicine

## 2018-09-09 ENCOUNTER — Ambulatory Visit
Admission: RE | Admit: 2018-09-09 | Discharge: 2018-09-09 | Disposition: A | Discharge status: Home / Self Care | Payer: BC Managed Care – PPO | Source: Ambulatory Visit | Attending: Obstetrics and Gynecology | Admitting: Obstetrics and Gynecology

## 2018-09-09 VITALS — BP 112/68 | HR 99 | Temp 98.1°F | Ht 67.0 in | Wt 190.5 lb

## 2018-09-09 DIAGNOSIS — Z1239 Encounter for other screening for malignant neoplasm of breast: Secondary | ICD-10-CM | POA: Diagnosis present

## 2018-09-09 DIAGNOSIS — J0140 Acute pansinusitis, unspecified: Secondary | ICD-10-CM | POA: Diagnosis not present

## 2018-09-09 MED ORDER — AMOXICILLIN-POT CLAVULANATE 875-125 MG PO TABS: 1.0000 | ORAL_TABLET | Freq: Two times a day (BID) | ORAL | 0 refills | Status: AC

## 2018-09-09 MED ORDER — FLUTICASONE PROPIONATE 50 MCG/ACT NA SUSP: 2.0000 | Freq: Every day | NASAL | 6 refills | Status: DC

## 2018-09-09 MED ORDER — GUAIFENESIN ER 600 MG PO TB12: 600.0000 mg | ORAL_TABLET | Freq: Two times a day (BID) | ORAL | 0 refills | Status: DC

## 2018-09-09 NOTE — Progress Notes (Signed)
Name: Brittney Johns   MRN: [id number]    DOB: 05/11/1971   Date:09/09/2018       Progress Note  Subjective  Chief Complaint  Chief Complaint  Patient presents with  . URI    Left ear pain with burning, cough,drainage, headache    HPI  Pt presents with concern for sinus pain/pressure/headache, bilateral ear fullness.  She got sick about 8-9 days ago, started to feel better for a few days, then over the weekend, sinus pain and pressure increased significantly.  She does have history of migraines, has taken her migraine medications and these have not touched this headache.  Hoarse voice, sore/scratchy throat, rhinorrhea, minimal cough that started yesterday, Denies chest pain or shortness of breath, body aches. 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   Patient Active Problem List   Diagnosis Date Noted  . Screening for colon cancer   . Family history of colon cancer requiring screening colonoscopy   . Status post vaginal hysterectomy 04/11/2016  . Anxiety 04/11/2016  . Allergic rhinitis 12/31/2015  . Dark stools 12/23/2015  . Constipation 12/23/2015  . Impaired fasting glucose 12/23/2015  . Overweight (BMI 25.0-29.9) 07/21/2015  . Surgical menopause 04/07/2015  . Chronic right hip pain 04/05/2015  . Endometriosis   . Fatigue   . Allergy   . IBS (irritable bowel syndrome)   . Migraine without aura 10/17/2013  . Headache 10/17/2013  . FOM (frequency of micturition) 04/24/2013  . Incomplete bladder emptying 04/24/2013  . Chronic interstitial cystitis 04/24/2013    Social History   Tobacco Use  . Smoking status: Never Smoker  . Smokeless tobacco: Never Used  Substance Use Topics  . Alcohol use: No    Alcohol/week: 0.0 standard drinks     Current Outpatient Medications:  .  estradiol (ESTRACE) 1 MG tablet, Take 1.5 tablets (1.5 mg total) by mouth daily., Disp: 135 tablet, Rfl: 3 .  imipramine (TOFRANIL) 10 MG tablet, Take 2 tablets  (20 mg total) at bedtime by mouth., Disp: 180 tablet, Rfl: 3 .  Multiple Vitamins-Minerals (MULTIVITAMIN WITH MINERALS) tablet, Take 1 tablet by mouth daily., Disp: , Rfl:  .  SAXENDA 18 MG/3ML SOPN, Inject 3 mg into the skin daily., Disp: 5 pen, Rfl: 5 .  topiramate (TOPAMAX) 100 MG tablet, TAKE 1 TABLET (100 MG TOTAL) BY MOUTH DAILY., Disp: 90 tablet, Rfl: 3 .  verapamil (CALAN) 80 MG tablet, TAKE 1 TABLET (80 MG TOTAL) BY MOUTH 2 (TWO) TIMES DAILY., Disp: 180 tablet, Rfl: 3 .  budesonide (PULMICORT) 0.5 MG/2ML nebulizer solution, Take 0.5 mg by nebulization 2 (two) times daily., Disp: , Rfl:  .  fluconazole (DIFLUCAN) 150 MG tablet, Take 1 tablet (150 mg total) by mouth once a week. For three doses (Patient not taking: Reported on 09/09/2018), Disp: 4 tablet, Rfl: 1 .  Insulin Pen Needle (B-D UF III MINI PEN NEEDLES) 31G X 5 MM MISC, FOR USE WITH SAXENDA USE DAILY, Disp: 30 each, Rfl: 5 .  Plecanatide (TRULANCE PO), Take by mouth., Disp: , Rfl:  .  terconazole (TERAZOL 3) 0.8 % vaginal cream, Place 1 applicator vaginally at bedtime. For 3 days (Patient not taking: Reported on 09/09/2018), Disp: 20 g, Rfl: 0  Allergies  Allergen Reactions  . Imitrex [Sumatriptan] Anaphylaxis    I personally reviewed active problem list, medication list, allergies, notes from last encounter with the patient/caregiver today.  ROS  Ten systems reviewed and is negative except  as mentioned in HPI  Objective  Vitals:   09/09/18 1132  BP: 112/68  Pulse: 99  Temp: 98.1 F (36.7 C)  SpO2: 97%  Weight: 190 lb 8 oz (86.4 kg)  Height: 5\' 7"  (1.702 m)   Body mass index is 29.84 kg/m.  Nursing Note and Vital Signs reviewed.  Physical Exam  Constitutional: Patient appears well-developed and well-nourished.  No distress.  HEENT: head atraumatic, normocephalic, pupils equal and reactive to light, Bilateral TM's without erythema, but both have mild effusion,  bilateral maxillary and frontal sinuses are  tender - L>R, neck supple with mild submandibular lymphadenopathy, throat within normal limits - no erythema or exudate, no tonsillar swelling Cardiovascular: Normal rate, regular rhythm and normal heart sounds.  No murmur heard. No BLE edema. Pulmonary/Chest: Effort normal and breath sounds clear bilaterally. No respiratory distress. Psychiatric: Patient has a normal mood and affect. behavior is normal. Judgment and thought content normal.  No results found for this or any previous visit (from the past 72 hour(s)).  Assessment & Plan  1. Acute non-recurrent pansinusitis - fluticasone (FLONASE) 50 MCG/ACT nasal spray; Place 2 sprays into both nostrils daily.  Dispense: 16 g; Refill: 6 - amoxicillin-clavulanate (AUGMENTIN) 875-125 MG tablet; Take 1 tablet by mouth 2 (two) times daily for 10 days.  Dispense: 20 tablet; Refill: 0 - guaiFENesin (MUCINEX) 600 MG 12 hr tablet; Take 1 tablet (600 mg total) by mouth 2 (two) times daily.  Dispense: 20 tablet; Refill: 0  -Red flags and when to present for emergency care or RTC including fever >101.25F, chest pain, shortness of breath, new/worsening/un-resolving symptoms, reviewed with patient at time of visit. Follow up and care instructions discussed and provided in AVS.

## 2018-09-09 NOTE — Patient Instructions (Signed)

## 2018-09-17 ENCOUNTER — Ambulatory Visit: Payer: BC Managed Care – PPO | Admitting: Family Medicine

## 2018-09-20 ENCOUNTER — Ambulatory Visit: Payer: BC Managed Care – PPO | Admitting: Family Medicine

## 2018-09-20 ENCOUNTER — Encounter: Payer: Self-pay | Admitting: Family Medicine

## 2018-09-20 VITALS — BP 102/64 | HR 99 | Temp 98.6°F | Ht 64.0 in | Wt 191.2 lb

## 2018-09-20 DIAGNOSIS — J0101 Acute recurrent maxillary sinusitis: Secondary | ICD-10-CM | POA: Diagnosis not present

## 2018-09-20 DIAGNOSIS — E669 Obesity, unspecified: Secondary | ICD-10-CM | POA: Diagnosis not present

## 2018-09-20 DIAGNOSIS — Z800 Family history of malignant neoplasm of digestive organs: Secondary | ICD-10-CM | POA: Diagnosis not present

## 2018-09-20 DIAGNOSIS — R7301 Impaired fasting glucose: Secondary | ICD-10-CM | POA: Diagnosis not present

## 2018-09-20 DIAGNOSIS — E66811 Obesity, class 1: Secondary | ICD-10-CM

## 2018-09-20 DIAGNOSIS — Z801 Family history of malignant neoplasm of trachea, bronchus and lung: Secondary | ICD-10-CM

## 2018-09-20 DIAGNOSIS — Z6832 Body mass index (BMI) 32.0-32.9, adult: Secondary | ICD-10-CM | POA: Insufficient documentation

## 2018-09-20 MED ORDER — AMOXICILLIN-POT CLAVULANATE 875-125 MG PO TABS: 1.0000 | ORAL_TABLET | Freq: Two times a day (BID) | ORAL | 0 refills | Status: DC

## 2018-09-20 NOTE — Assessment & Plan Note (Signed)
Excellent A1c in February; best thing she can do to prevent this from progressing to type 2 diabetes is weigh tloss

## 2018-09-20 NOTE — Patient Instructions (Addendum)
Continue the antibiotics for another five days Okay to continue Mucinex Please do eat yogurt or kimchi or take a probiotic daily for the next month We want to replace the healthy germs in the gut If you notice foul, watery diarrhea in the next two months, schedule an appointment RIGHT AWAY or go to an urgent care or the emergency room if a holiday or over a weekend   Obesity, Adult Obesity is having too much body fat. If you have a BMI of 30 or more, you are obese. BMI is a number that explains how much body fat you have. Obesity is often caused by taking in (consuming) more calories than your body uses. Obesity can cause serious health problems. Changing your lifestyle can help to treat obesity. Follow these instructions at home: Eating and drinking   Follow advice from your doctor about what to eat and drink. Your doctor may tell you to: ? Cut down on (limit) fast foods, sweets, and processed snack foods. ? Choose low-fat options. For example, choose low-fat milk instead of whole milk. ? Eat 5 or more servings of fruits or vegetables every day. ? Eat at home more often. This gives you more control over what you eat. ? Choose healthy foods when you eat out. ? Learn what a healthy portion size is. A portion size is the amount of a certain food that is healthy for you to eat at one time. This is different for each person. ? Keep low-fat snacks available. ? Avoid sugary drinks. These include soda, fruit juice, iced tea that is sweetened with sugar, and flavored milk. ? Eat a healthy breakfast.  Drink enough water to keep your pee (urine) clear or pale yellow.  Do not go without eating for long periods of time (do not fast).  Do not go on popular or trendy diets (fad diets). Physical Activity  Exercise often, as told by your doctor. Ask your doctor: ? What types of exercise are safe for you. ? How often you should exercise.  Warm up and stretch before being active.  Do slow  stretching after being active (cool down).  Rest between times of being active. Lifestyle  Limit how much time you spend in front of your TV, computer, or video game system (be less sedentary).  Find ways to reward yourself that do not involve food.  Limit alcohol intake to no more than 1 drink a day for nonpregnant women and 2 drinks a day for men. One drink equals 12 oz of beer, 5 oz of wine, or 1 oz of hard liquor. General instructions  Keep a weight loss journal. This can help you keep track of: ? The food that you eat. ? The exercise that you do.  Take over-the-counter and prescription medicines only as told by your doctor.  Take vitamins and supplements only as told by your doctor.  Think about joining a support group. Your doctor may be able to help with this.  Keep all follow-up visits as told by your doctor. This is important. Contact a doctor if:  You cannot meet your weight loss goal after you have changed your diet and lifestyle for 6 weeks. This information is not intended to replace advice given to you by your health care provider. Make sure you discuss any questions you have with your health care provider. Document Released: 11/27/2011 Document Revised: 02/10/2016 Document Reviewed: 06/23/2015 Elsevier Interactive Patient Education  2019 Elsevier Inc.  Preventing Health Risks of Being Overweight Maintaining  a healthy body weight is an important part of your overall health. Your healthy body weight depends on your age, gender, and height. Being overweight puts you at risk for many health problems, including:  Heart disease.  Diabetes.  Problems sleeping.  Joint problems. You can make changes to your diet and lifestyle to prevent these risks. Consider working with a health care provider or a dietitian to make these changes. What nutrition changes can be made?   Eat only as much as your body needs. In most cases, this is about 2,000 calories a day, but the  amount varies depending on your height, gender, and activity level. Ask your health care provider how many calories you should have each day. Eating more than your body needs on a regular basis can cause you to become overweight or obese.  Eat slowly, and stop eating when you feel full.  Choose healthy foods, including: ? Fruits and vegetables. ? Lean meats. ? Low-fat dairy products. ? High-fiber foods, such as whole grains and beans. ? Healthy snacks like vegetable sticks, a piece of fruit, or a small amount of yogurt or cheese.  Avoid foods and drinks that are high in sugar, salt (sodium), saturated fat, or trans fat. This includes: ? Many desserts such as candy, cookies, and ice cream. ? Soda. ? Fried foods. ? Processed meats such as hot dogs or lunch meats. ? Prepackaged snack foods. What lifestyle changes can be made?   Exercise for at least 150 minutes a week to prevent weight gain, or as often as recommended by your health care provider. Do moderate-intensity exercise, such as brisk walking. ? Spread it out by exercising for 30 minutes 5 days a week, or in short 10-minute bursts several times a day.  Find other ways to stay active and burn calories, such as yard work or a hobby that involves physical activity.  Get at least 8 hours of sleep each night. When you are well-rested, you are more likely to be active and make healthy choices during the day. To sleep better: ? Try to go to bed and wake up at about the same time every day. ? Keep your bedroom dark, quiet, and cool. ? Make sure that your bed is comfortable. ? Avoid stimulating activities, such as watching television or exercising, for at least one hour before bedtime. Why are these changes important? Eating healthy and being active helps you lose weight and prevent health problems caused by being overweight. Making these changes can also help you manage stress, feel better mentally, and connect with friends and  family. What can happen if changes are not made? Being overweight can affect you for your entire life. You may develop joint or bone problems that make it painful or difficult for you to play sports or do activities you enjoy. Being overweight puts stress on your heart and lungs and can lead to medical problems like diabetes, heart disease, and sleeping problems. Where to find support You can get support for preventing health risks of being overweight from:  Your health care provider or a dietitian. They can provide guidance about healthy eating and healthy lifestyle choices.  Weight loss support groups, online or in-person. Where to find more information  MyPlate: https://ball-collins.biz/www.choosemyplate.gov ? This an online tool that provides personalized recommendations about foods to eat each day.  The Centers for Disease Control and Prevention: AffordableScrapbook.glwww.cdc.gov/healthyweight ? This resource gives tips for managing weight and having an active lifestyle. Summary  To prevent unhealthy weight  gain, it is important to maintain a healthy diet high in vegetables and whole grains, exercise regularly, and get at least 8 hours of sleep each night.  Making these changes helps prevent many long-term (chronic) health conditions that can shorten your life, such as diabetes, heart disease, and stroke. This information is not intended to replace advice given to you by your health care provider. Make sure you discuss any questions you have with your health care provider. Document Released: 08/01/2017 Document Revised: 08/01/2017 Document Reviewed: 08/01/2017 Elsevier Interactive Patient Education  2019 [employer]

## 2018-09-20 NOTE — Assessment & Plan Note (Signed)
Continue Saxenda; patient will try to lose five pounds per month for five months; increase activity level; 150 minutes a week minimum; build up gradually; plenty of water; avoid empty calories
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
## 2018-10-02 ENCOUNTER — Ambulatory Visit: Payer: BC Managed Care – PPO | Attending: Obstetrics and Gynecology

## 2018-10-02 DIAGNOSIS — N3946 Mixed incontinence: Secondary | ICD-10-CM | POA: Insufficient documentation

## 2018-10-02 DIAGNOSIS — M62838 Other muscle spasm: Secondary | ICD-10-CM | POA: Diagnosis not present

## 2018-10-02 DIAGNOSIS — M791 Myalgia, unspecified site: Secondary | ICD-10-CM

## 2018-10-02 DIAGNOSIS — K5902 Outlet dysfunction constipation: Secondary | ICD-10-CM | POA: Diagnosis present
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
## 2018-10-02 NOTE — Patient Instructions (Signed)
    Bring both knees up to your chest and then hold the one farthest from the edge of the table/bed and let the other relax toward the floor until stretch is felt through the front of the hip.  Hold for __5__ deep belly breaths. Relax. Repeat __2-3__ times per side.   Do this __1-2__ times per day.      Place foam roller or towel under your upper back between your shoulder blades. Support your head with your hands, elbows forward, and gently rock back and forth and side to side to improve motion in your back.   Move the foam roller or towel up and down to a few spots in the upper back, repeating the process.

## 2018-10-07 ENCOUNTER — Ambulatory Visit: Payer: BC Managed Care – PPO | Admitting: Gastroenterology

## 2018-10-23 ENCOUNTER — Ambulatory Visit: Payer: BC Managed Care – PPO | Attending: Obstetrics and Gynecology

## 2018-10-23 DIAGNOSIS — K5902 Outlet dysfunction constipation: Secondary | ICD-10-CM | POA: Diagnosis present

## 2018-10-23 DIAGNOSIS — M791 Myalgia, unspecified site: Secondary | ICD-10-CM

## 2018-10-23 DIAGNOSIS — N3946 Mixed incontinence: Secondary | ICD-10-CM | POA: Diagnosis present

## 2018-10-23 DIAGNOSIS — M62838 Other muscle spasm: Secondary | ICD-10-CM | POA: Diagnosis present
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
## 2018-10-23 NOTE — Patient Instructions (Signed)
Bracing With Arm / Leg Raise (Quadruped)    On hands and knees find neutral spine. Exhale to tighten pelvic floor and abdominals and hold. Alternating, lift leg to hip level. Repeat _10x2__ times. Do __1_ time a day.    DEVELOPMENTAL POSITION: Tall Kneeling to Half Kneeling    From tall kneeling position, shift weight to one side. Bring opposite leg forward, place foot flat on surface. _5__ reps per set, _3__ sets per day, __7_ days per week Repeat with other leg.     Inhale as you lower down gradually releasing the glutes and the lower tummy muscles. As you exhale, gradually increase the tension in the glutes and lower tummy until you reach the top and give an "extra squeeze" at the top. Repeat 10x_3_, __1_ Times per day.    Do 15 small steps in each direction while maintaining a slight squat with hip hinge (not rounded). Repeat 3 times

## 2018-11-04 ENCOUNTER — Encounter: Payer: Self-pay | Admitting: Nurse Practitioner

## 2018-11-04 ENCOUNTER — Ambulatory Visit (INDEPENDENT_AMBULATORY_CARE_PROVIDER_SITE_OTHER): Payer: BC Managed Care – PPO | Admitting: Nurse Practitioner

## 2018-11-04 VITALS — BP 112/72 | HR 88 | Temp 97.4°F | Resp 16 | Ht 67.0 in | Wt 187.9 lb

## 2018-11-04 DIAGNOSIS — H6982 Other specified disorders of Eustachian tube, left ear: Secondary | ICD-10-CM | POA: Diagnosis not present

## 2018-11-04 DIAGNOSIS — J0111 Acute recurrent frontal sinusitis: Secondary | ICD-10-CM | POA: Diagnosis not present

## 2018-11-04 DIAGNOSIS — H9202 Otalgia, left ear: Secondary | ICD-10-CM

## 2018-11-04 MED ORDER — DOXYCYCLINE HYCLATE 100 MG PO TABS: 100.0000 mg | ORAL_TABLET | Freq: Two times a day (BID) | ORAL | 0 refills | Status: DC
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
## 2018-11-04 NOTE — Patient Instructions (Addendum)
-   Please call and schedule an appointment with ENT - Please start taking antibiotic as prescribed with food - Alternate between tylenol and ibuprofen for pain relief.  - Take flonase 2 sprays daily for the next 3-4 days then 1 spray in each nostril daily - Take allegra daily for the next 2 weeks and just just as needed for post-nasal drip and congestion.   Earache, Adult An earache, or ear pain, can be caused by many things, including:  An infection.  Ear wax buildup.  Ear pressure.  Something in the ear that should not be there (foreign body).  A sore throat.  Tooth problems.  Jaw problems. Treatment of the earache will depend on the cause. If the cause is not clear or cannot be determined, you may need to watch your symptoms until your earache goes away or until a cause is found. Follow these instructions at home: Pay attention to any changes in your symptoms. Take these actions to help with your pain:  Take or apply over-the-counter and prescription medicines only as told by your health care provider.  If you were prescribed an antibiotic medicine, use it as told by your health care provider. Do not stop using the antibiotic even if you start to feel better.  Do not put anything in your ear other than medicine that is prescribed by your health care provider.  If directed, apply heat to the affected area as often as told by your health care provider. Use the heat source that your health care provider recommends, such as a moist heat pack or a heating pad. ? Place a towel between your skin and the heat source. ? Leave the heat on for 20-30 minutes. ? Remove the heat if your skin turns bright red. This is especially important if you are unable to feel pain, heat, or cold. You may have a greater risk of getting burned.  If directed, put ice on the ear: ? Put ice in a plastic bag. ? Place a towel between your skin and the bag. ? Leave the ice on for 20 minutes, 2-3 times a  day.  Try resting in an upright position instead of lying down. This may help to reduce pressure in your ear and relieve pain.  Chew gum if it helps to relieve your ear pain.  Treat any allergies as told by your health care provider.  Keep all follow-up visits as told by your health care provider. This is important. Contact a health care provider if:  Your pain does not improve within 2 days.  Your earache gets worse.  You have new symptoms.  You have a fever. Get help right away if:  You have a severe headache.  You have a stiff neck.  You have trouble swallowing.  You have redness or swelling behind your ear.  You have fluid or blood coming from your ear.  You have hearing loss.  You feel dizzy. This information is not intended to replace advice given to you by your health care provider. Make sure you discuss any questions you have with your health care provider. Document Released: 04/21/2004 Document Revised: 05/02/2016 Document Reviewed: 02/28/2016 Elsevier Interactive Patient Education  [employer].

## 2018-11-06 ENCOUNTER — Ambulatory Visit: Payer: BC Managed Care – PPO

## 2018-11-06 DIAGNOSIS — M62838 Other muscle spasm: Secondary | ICD-10-CM | POA: Diagnosis not present

## 2018-11-06 DIAGNOSIS — K5902 Outlet dysfunction constipation: Secondary | ICD-10-CM

## 2018-11-06 DIAGNOSIS — M791 Myalgia, unspecified site: Secondary | ICD-10-CM

## 2018-11-06 DIAGNOSIS — N3946 Mixed incontinence: Secondary | ICD-10-CM
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
## 2018-11-11 ENCOUNTER — Ambulatory Visit: Payer: BC Managed Care – PPO | Admitting: Gastroenterology

## 2018-11-15 ENCOUNTER — Telehealth: Payer: Self-pay | Admitting: Family Medicine

## 2018-11-15 NOTE — Telephone Encounter (Signed)
Pt states she is 5'7, so I told her med would not be approved

## 2018-11-15 NOTE — Telephone Encounter (Signed)
There is a big change in the BMI and height from Jan 2020 visit to the one in Feb 2020 Please contact patient, verify that her height was measured and changed from 5'4 to 5'7" at last visit If so, she is no longer obese We're working on the weight loss medicine approval and I need accurate height / weight / BMI

## 2018-11-18 ENCOUNTER — Encounter: Payer: Self-pay | Admitting: Neurology

## 2018-11-20 ENCOUNTER — Ambulatory Visit: Payer: BC Managed Care – PPO

## 2018-11-29 ENCOUNTER — Encounter: Payer: Self-pay | Admitting: Family Medicine

## 2018-12-04 ENCOUNTER — Ambulatory Visit: Payer: BC Managed Care – PPO

## 2018-12-05 ENCOUNTER — Telehealth: Payer: Self-pay | Admitting: Gastroenterology

## 2018-12-05 NOTE — Telephone Encounter (Signed)
12-05-18 @ 2:52 l/m for pt appt cx's to call & r/s for 2-3 mon per DR Brittney Johns due to virus.

## 2018-12-09 ENCOUNTER — Ambulatory Visit: Payer: BC Managed Care – PPO | Admitting: Gastroenterology

## 2018-12-15 ENCOUNTER — Other Ambulatory Visit: Payer: Self-pay | Admitting: Family Medicine

## 2018-12-20 ENCOUNTER — Ambulatory Visit: Payer: BC Managed Care – PPO | Admitting: Family Medicine

## 2018-12-24 ENCOUNTER — Ambulatory Visit: Payer: BC Managed Care – PPO | Admitting: Neurology

## 2018-12-25 ENCOUNTER — Other Ambulatory Visit: Payer: Self-pay

## 2018-12-25 ENCOUNTER — Ambulatory Visit (INDEPENDENT_AMBULATORY_CARE_PROVIDER_SITE_OTHER): Payer: BC Managed Care – PPO

## 2018-12-25 ENCOUNTER — Encounter: Payer: Self-pay | Admitting: Podiatry

## 2018-12-25 ENCOUNTER — Ambulatory Visit (INDEPENDENT_AMBULATORY_CARE_PROVIDER_SITE_OTHER): Payer: BC Managed Care – PPO | Admitting: Podiatry

## 2018-12-25 DIAGNOSIS — M2011 Hallux valgus (acquired), right foot: Secondary | ICD-10-CM | POA: Diagnosis not present

## 2018-12-25 NOTE — Progress Notes (Signed)
She presents today for surgical consult regarding her right foot.  She states that she would like to have surgery done at some point in June or July.  She states that the right foot is becoming intolerable states that anytime shoe touches the big toe it is exquisitely sore.  She is pointing to the first metatarsal phalangeal joint.  She also states that the spot of here hurts that she refers to the lateral aspect of the right foot and the tailor's bunion deformity.  I have reviewed her past medical history medications allergies surgery social history.  Currently her only allergy is Imitrex.  Objective: Vital signs are stable alert and oriented x3.  Pulses are palpable.  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  Painful on range of motion tailor's bunion deformities are also noted radiographs taken today demonstrate similar findings.  There is mild edema no erythema cellulitis drainage odor no open lesions or wounds.  She has good flexible range of motion at the first metatarsal phalangeal joint is slight limitation on dorsal lateral aspect of the joint.  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  Plan: Discussed etiology pathology conservative surgical therapies at this point time consented her for an [facility] bunion repair with screws right as well as 1/5 metatarsal osteotomy with screws right she understands this is amenable to it.  We discussed the possible postop complications which may include but not limited to postop pain bleeding swelling infection recurrence need for further surgery overcorrection under correction loss of digit loss of limb loss of life.  We dispensed information regarding the surgery center and anesthesia group today also information regarding instructions to the morning of surgery.  I also dispensed a Cam walker today for her to wear prior to surgery if necessary otherwise this will be the boot that she will wear after surgery.

## 2018-12-25 NOTE — Patient Instructions (Signed)
Pre-Operative Instructions  Congratulations, you have decided to take an important step towards improving your quality of life.  You can be assured that the doctors and staff at [facility] will be with you every step of the way.  Here are some important things you should know:  1. Plan to be at the surgery center/hospital at least 1 (one) hour prior to your scheduled time, unless otherwise directed by the surgical center/hospital staff.  You must have a responsible adult accompany you, remain during the surgery and drive you home.  Make sure you have directions to the surgical center/hospital to ensure you arrive on time. 2. If you are having surgery at [facility] or [facility] hospitals, you will need a copy of your medical history and physical form from your family physician within one month prior to the date of surgery. We will give you a form for your primary physician to complete.  3. We make every effort to accommodate the date you request for surgery.  However, there are times where surgery dates or times have to be moved.  We will contact you as soon as possible if a change in schedule is required.   4. No aspirin/ibuprofen for one week before surgery.  If you are on aspirin, any non-steroidal anti-inflammatory medications (Mobic, Aleve, Ibuprofen) should not be taken seven (7) days prior to your surgery.  You make take Tylenol for pain prior to surgery.  5. Medications - If you are taking daily heart and blood pressure medications, seizure, reflux, allergy, asthma, anxiety, pain or diabetes medications, make sure you notify the surgery center/hospital before the day of surgery so they can tell you which medications you should take or avoid the day of surgery. 6. No food or drink after midnight the night before surgery unless directed otherwise by surgical center/hospital staff. 7. No alcoholic beverages 24-hours prior to surgery.  No smoking 24-hours prior or 24-hours after  surgery. 8. Wear loose pants or shorts. They should be loose enough to fit over bandages, boots, and casts. 9. Don't wear slip-on shoes. Sneakers are preferred. 10. Bring your boot with you to the surgery center/hospital.  Also bring crutches or a walker if your physician has prescribed it for you.  If you do not have this equipment, it will be provided for you after surgery. 11. If you have not been contacted by the surgery center/hospital by the day before your surgery, call to confirm the date and time of your surgery. 12. Leave-time from work may vary depending on the type of surgery you have.  Appropriate arrangements should be made prior to surgery with your employer. 13. Prescriptions will be provided immediately following surgery by your doctor.  Fill these as soon as possible after surgery and take the medication as directed. Pain medications will not be refilled on weekends and must be approved by the doctor. 14. Remove nail polish on the operative foot and avoid getting pedicures prior to surgery. 15. Wash the night before surgery.  The night before surgery wash the foot and leg well with water and the antibacterial soap provided. Be sure to pay special attention to beneath the toenails and in between the toes.  Wash for at least three (3) minutes. Rinse thoroughly with water and dry well with a towel.  Perform this wash unless told not to do so by your physician.  Enclosed: 1 Ice pack (please put in freezer the night before surgery)   1 Hibiclens skin cleaner     Pre-op instructions  If you have any questions regarding the instructions, please do not hesitate to call our office.  [place]: [address], [place], [place] [address] -- [phone]  [place]: [address] [place], [place] [address] -- [phone]  [place]: [address]  [place], [place] [address] -- [phone]  [place]: [address], [address], [place], [place] [address] -- [phone]  Website:  [email or web address] 

## 2018-12-31 ENCOUNTER — Ambulatory Visit: Payer: BC Managed Care – PPO | Admitting: Nurse Practitioner

## 2019-01-21 ENCOUNTER — Telehealth: Payer: Self-pay | Admitting: *Deleted

## 2019-01-21 ENCOUNTER — Encounter: Payer: Self-pay | Admitting: *Deleted

## 2019-01-21 NOTE — Telephone Encounter (Signed)
I called pt. Received refill for topamax from [facility] / [place], [place].  Needs annual appt for refills.  Due to current COVID 19 pandemic, our office is severely reducing in office visits until further notice, in order to minimize the risk to our patients and healthcare providers. She consented to [email or web address] email.  Confirmed her email in demographics.   Pt understands that although there may be some limitations with this type of visit, we will take all precautions to reduce any security or privacy concerns.  Pt understands that this will be treated like an in office visit and we will file with pt's insurance.  Chart updated.  Email:  [email or web address].
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
## 2019-01-22 ENCOUNTER — Encounter: Payer: Self-pay | Admitting: Family Medicine

## 2019-01-22 ENCOUNTER — Ambulatory Visit (INDEPENDENT_AMBULATORY_CARE_PROVIDER_SITE_OTHER): Payer: BC Managed Care – PPO | Admitting: Family Medicine

## 2019-01-22 ENCOUNTER — Other Ambulatory Visit: Payer: Self-pay

## 2019-01-22 DIAGNOSIS — G43009 Migraine without aura, not intractable, without status migrainosus: Secondary | ICD-10-CM

## 2019-01-22 MED ORDER — TOPIRAMATE 50 MG PO TABS: ORAL_TABLET | ORAL | 3 refills | Status: DC

## 2019-01-29 ENCOUNTER — Telehealth: Payer: Self-pay | Admitting: *Deleted

## 2019-01-29 NOTE — Telephone Encounter (Signed)
"  I'm calling to schedule my surgery with Dr. Al Corpus."  He does surgeries on Fridays.  Do you have a date you would like?  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  That's our last day.  So, can he do it on June 12?"  Yes, he can do it on February 28, 2019.  I'll get it scheduled.  Someone from the surgical center will give you a call a day or two prior to your surgery date, that person will give you your arrival time.  You need to register online with the surgical center via their [facility] [employer] Portal.

## 2019-02-03 ENCOUNTER — Other Ambulatory Visit: Payer: Self-pay | Admitting: Otolaryngology

## 2019-02-03 DIAGNOSIS — H9202 Otalgia, left ear: Secondary | ICD-10-CM

## 2019-02-03 NOTE — Progress Notes (Signed)
I have reviewed and agreed above plan. 

## 2019-02-05 ENCOUNTER — Telehealth: Payer: Self-pay

## 2019-02-05 NOTE — Telephone Encounter (Signed)
Follow up has been scheduled. Patient is aware of appt day and time.   

## 2019-02-12 ENCOUNTER — Other Ambulatory Visit: Payer: Self-pay

## 2019-02-12 ENCOUNTER — Other Ambulatory Visit: Payer: BC Managed Care – PPO

## 2019-02-12 ENCOUNTER — Ambulatory Visit
Admission: RE | Admit: 2019-02-12 | Discharge: 2019-02-12 | Disposition: A | Discharge status: Home / Self Care | Payer: BC Managed Care – PPO | Source: Ambulatory Visit | Attending: Otolaryngology | Admitting: Otolaryngology

## 2019-02-12 DIAGNOSIS — H9202 Otalgia, left ear: Secondary | ICD-10-CM

## 2019-02-14 ENCOUNTER — Other Ambulatory Visit: Payer: Self-pay | Admitting: Otolaryngology

## 2019-02-14 DIAGNOSIS — H9202 Otalgia, left ear: Secondary | ICD-10-CM

## 2019-02-17 ENCOUNTER — Other Ambulatory Visit: Payer: Self-pay

## 2019-02-17 ENCOUNTER — Ambulatory Visit
Admission: RE | Admit: 2019-02-17 | Discharge: 2019-02-17 | Disposition: A | Discharge status: Home / Self Care | Payer: BC Managed Care – PPO | Source: Ambulatory Visit | Attending: Otolaryngology | Admitting: Otolaryngology

## 2019-02-17 DIAGNOSIS — H9202 Otalgia, left ear: Secondary | ICD-10-CM | POA: Diagnosis present

## 2019-02-17 MED ORDER — GADOBUTROL 1 MMOL/ML IV SOLN
8.0000 mL | Freq: Once | INTRAVENOUS | Status: AC | PRN
  Administered 2019-02-17: 8 mL via INTRAVENOUS

## 2019-02-24 ENCOUNTER — Ambulatory Visit: Payer: BC Managed Care – PPO | Admitting: Gastroenterology

## 2019-02-26 ENCOUNTER — Telehealth: Payer: Self-pay | Admitting: *Deleted

## 2019-02-26 ENCOUNTER — Other Ambulatory Visit: Payer: Self-pay | Admitting: Podiatry

## 2019-02-26 MED ORDER — OXYCODONE-ACETAMINOPHEN 10-325 MG PO TABS: 1.0000 | ORAL_TABLET | Freq: Four times a day (QID) | ORAL | 0 refills | Status: AC | PRN

## 2019-02-26 MED ORDER — ONDANSETRON HCL 4 MG PO TABS: 4.0000 mg | ORAL_TABLET | Freq: Three times a day (TID) | ORAL | 0 refills | Status: DC | PRN

## 2019-02-26 MED ORDER — CEPHALEXIN 500 MG PO CAPS: 500.0000 mg | ORAL_CAPSULE | Freq: Three times a day (TID) | ORAL | 0 refills | Status: DC

## 2019-02-26 NOTE — Telephone Encounter (Signed)
DOS 02/28/2019, CPT CODES: 22025 - Brittney Johns AND 42706 - METATARSAL OSTEOTOMY 5TH RIGHT FOOT  BCBS: Policy Effective : 23/76/2831 - 09/17/9998   Member Liability Summary      In-Network    Max Per Benefit Period Year-to-Date Remaining  CoInsurance  20%   Deductible  $1250.00 $0.00  Out-Of-Pocket  $4890.00 $2580.30   AMBULATORY SURGERY  In Network  Copay Coinsurance  Not Applicable  51% per [place]

## 2019-02-28 ENCOUNTER — Encounter: Payer: Self-pay | Admitting: Podiatry

## 2019-02-28 DIAGNOSIS — M2011 Hallux valgus (acquired), right foot: Secondary | ICD-10-CM

## 2019-02-28 DIAGNOSIS — M21541 Acquired clubfoot, right foot: Secondary | ICD-10-CM | POA: Diagnosis not present

## 2019-03-01 ENCOUNTER — Encounter: Payer: Self-pay | Admitting: Podiatry

## 2019-03-01 NOTE — Progress Notes (Signed)
Patients husband called wanting to go over postop instructions. He was asking how long his wife can be on her foot. Encouraged ice/elevation and only to be on her foot minimally for a few minutes out of each hour during the initial post-op period. He states that she just woke up and is feeling well. She apparently had a nerve block and discussed to also minimize activity while she cannot feel her foot. We reviewed other post-op instructions as well. He had no further questions or concerns. Encouraged to call back if anything changes or any additional questions.   Trula Slade

## 2019-03-03 ENCOUNTER — Telehealth: Payer: Self-pay

## 2019-03-03 NOTE — Telephone Encounter (Signed)
I returned call to husband.  He had questions regarding taking the boot off and icing foot and concerned about swelling.  I informed him that its very normal for her foot to swell after surgery and that patient needs to keep foot elevated, its ok to take off the boot only to ice foot, then put back on when up or sleeping.  He verbalized understanding and will call if he or patient has any other questions or concerns.

## 2019-03-03 NOTE — Telephone Encounter (Signed)
-----   Message from Simone Curia sent at 03/03/2019  4:43 PM EDT ----- Regarding: post op care Pt had surgery on Friday, Pt's husband has some questions concerning "post op care". If you could please call him at [phone]. Thank you

## 2019-03-04 ENCOUNTER — Other Ambulatory Visit: Payer: BC Managed Care – PPO

## 2019-03-05 ENCOUNTER — Other Ambulatory Visit: Payer: Self-pay

## 2019-03-05 ENCOUNTER — Ambulatory Visit (INDEPENDENT_AMBULATORY_CARE_PROVIDER_SITE_OTHER): Payer: BC Managed Care – PPO

## 2019-03-05 ENCOUNTER — Ambulatory Visit (INDEPENDENT_AMBULATORY_CARE_PROVIDER_SITE_OTHER): Payer: Self-pay | Admitting: Podiatry

## 2019-03-05 VITALS — Temp 96.4°F

## 2019-03-05 DIAGNOSIS — M2011 Hallux valgus (acquired), right foot: Secondary | ICD-10-CM | POA: Diagnosis not present

## 2019-03-05 DIAGNOSIS — Z9889 Other specified postprocedural states: Secondary | ICD-10-CM

## 2019-03-05 NOTE — Progress Notes (Signed)
She presents today first postop visit date of surgery 02/28/2019 [facility] bunionectomy and metatarsal osteotomy fifth metatarsal right foot.  States that is doing better the ibuprofen is helping with the pain but still have some nausea up to yesterday but feel better today.  She denies any fever chills nausea vomiting muscle aches pains calf pain back pain chest pain or shortness of breath.  The only nausea was from the antibiotics.  Objective: Vital signs are stable alert and oriented x3.  Patient presents today cam walker ambulating.  Was cam walker smooth dressed a compressive dressing was removed demonstrates no erythema cellulitis drainage odor considerable edema.  Radiographs taken today demonstrate a first and fifth metatarsal osteotomy and screw fixation.  All this is in good position.  Assessment: Well-healing surgical foot.  Plan: Redressed today compressive dressing demonstrated range of motion exercises encouraged her to do so follow-up with her in 1 week and continue use of the cam walker.

## 2019-03-12 ENCOUNTER — Encounter: Payer: Self-pay | Admitting: Podiatry

## 2019-03-12 ENCOUNTER — Other Ambulatory Visit: Payer: BC Managed Care – PPO

## 2019-03-12 ENCOUNTER — Ambulatory Visit (INDEPENDENT_AMBULATORY_CARE_PROVIDER_SITE_OTHER): Payer: BC Managed Care – PPO | Admitting: Podiatry

## 2019-03-12 ENCOUNTER — Other Ambulatory Visit: Payer: Self-pay

## 2019-03-12 VITALS — Temp 98.1°F

## 2019-03-12 DIAGNOSIS — M2011 Hallux valgus (acquired), right foot: Secondary | ICD-10-CM

## 2019-03-12 DIAGNOSIS — Z9889 Other specified postprocedural states: Secondary | ICD-10-CM

## 2019-03-12 NOTE — Progress Notes (Signed)
She presents today for her second postop visit date of surgery 02/28/2019 status post [facility] bunionectomy and 1/5 metatarsal osteotomy right.  States that is doing better the swelling is come down considerably she states that the stitches are pulling but she denies fever chills nausea vomiting muscle aches pains calf pain back pain chest pain or shortness of breath.  Objective: Vital signs are stable she alert oriented x3 she has great range of motion of the first metatarsophalangeal joint of the right foot and fifth metatarsal osteotomy appears to be healing very nicely suture sites are going on to heal very nicely sutures were removed today no dehiscence.  She has great range of motion of the toe.  Assessment: Well-healing surgical foot right.  Plan: Put her in a Darco shoe and a compression anklet I would like to follow-up with her in 2 weeks.

## 2019-03-18 ENCOUNTER — Other Ambulatory Visit: Payer: Self-pay | Admitting: *Deleted

## 2019-03-18 MED ORDER — VERAPAMIL HCL 80 MG PO TABS: ORAL_TABLET | ORAL | 3 refills | Status: DC

## 2019-03-25 ENCOUNTER — Ambulatory Visit (INDEPENDENT_AMBULATORY_CARE_PROVIDER_SITE_OTHER): Payer: BC Managed Care – PPO | Admitting: Podiatry

## 2019-03-25 ENCOUNTER — Other Ambulatory Visit: Payer: Self-pay

## 2019-03-25 ENCOUNTER — Ambulatory Visit (INDEPENDENT_AMBULATORY_CARE_PROVIDER_SITE_OTHER): Payer: BC Managed Care – PPO

## 2019-03-25 VITALS — Temp 97.3°F

## 2019-03-25 DIAGNOSIS — M2011 Hallux valgus (acquired), right foot: Secondary | ICD-10-CM

## 2019-03-25 DIAGNOSIS — Z9889 Other specified postprocedural states: Secondary | ICD-10-CM | POA: Diagnosis not present

## 2019-03-26 ENCOUNTER — Other Ambulatory Visit: Payer: BC Managed Care – PPO
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
## 2019-04-02 ENCOUNTER — Other Ambulatory Visit: Payer: BC Managed Care – PPO

## 2019-04-09 ENCOUNTER — Ambulatory Visit (INDEPENDENT_AMBULATORY_CARE_PROVIDER_SITE_OTHER): Payer: Self-pay | Admitting: Podiatry

## 2019-04-09 ENCOUNTER — Ambulatory Visit (INDEPENDENT_AMBULATORY_CARE_PROVIDER_SITE_OTHER): Payer: BC Managed Care – PPO

## 2019-04-09 ENCOUNTER — Other Ambulatory Visit: Payer: Self-pay

## 2019-04-09 ENCOUNTER — Encounter: Payer: Self-pay | Admitting: Podiatry

## 2019-04-09 VITALS — Temp 98.6°F

## 2019-04-09 DIAGNOSIS — M2011 Hallux valgus (acquired), right foot: Secondary | ICD-10-CM

## 2019-04-09 DIAGNOSIS — Z9889 Other specified postprocedural states: Secondary | ICD-10-CM

## 2019-04-09 NOTE — Progress Notes (Signed)
She presents today for follow-up of her Brittney Johns bunionectomy fifth metatarsal osteotomy right foot she is about 6 weeks out.  She denies fever chills nausea vomiting muscle aches and pains states that they are feeling pretty good.  Objective: Vital signs are stable alert and oriented x3 minimal edema no erythema cellulitis drainage odor great range of motion of the first metatarsophalangeal joint.  Incision site is gone on to heal uneventfully radiographs taken today demonstrate well-healing osteotomy with internal fixation and screw fixation first and fifth metatarsals.  Assessment: Well-healing surgical foot.  Plan: Try to get back into regular tennis shoe we will follow-up with her in 1 month.

## 2019-04-16 ENCOUNTER — Other Ambulatory Visit: Payer: BC Managed Care – PPO

## 2019-04-28 ENCOUNTER — Other Ambulatory Visit: Payer: Self-pay

## 2019-04-28 ENCOUNTER — Ambulatory Visit
Admission: EM | Admit: 2019-04-28 | Discharge: 2019-04-28 | Disposition: A | Discharge status: Home / Self Care | Payer: BC Managed Care – PPO | Attending: Family Medicine | Admitting: Family Medicine

## 2019-04-28 ENCOUNTER — Encounter: Payer: Self-pay | Admitting: Emergency Medicine

## 2019-04-28 ENCOUNTER — Ambulatory Visit: Payer: Self-pay | Admitting: *Deleted

## 2019-04-28 DIAGNOSIS — R197 Diarrhea, unspecified: Secondary | ICD-10-CM

## 2019-04-28 DIAGNOSIS — A059 Bacterial foodborne intoxication, unspecified: Secondary | ICD-10-CM

## 2019-04-28 NOTE — Telephone Encounter (Signed)
Summary: possible c-diff    Pt called and stated that she broke out in a sweat and started to vomit with diarrhea. Pt states that she was around someone who has c-diff. Pt would like a call back regarding.       Attempted to call patient mobile #- mailbox is full. Got to patient at home number. Symptoms started Friday night and have not gotten better- patient at first thought she had eaten some bad fish- but was later advised that her brother in law has C-diff and she was around him twice last week.  Call to office to set up appointment- they are full and advise UC for patient.  Reason for Disposition . [1] MODERATE diarrhea (e.g., 4-6 times / day more than normal) AND [2] present > 48 hours (2 days)  Answer Assessment - Initial Assessment Questions 1. DIARRHEA SEVERITY: "How bad is the diarrhea?" "How many extra stools have you had in the past 24 hours than normal?"    - NO DIARRHEA (SCALE 0)   - MILD (SCALE 1-3): Few loose or mushy BMs; increase of 1-3 stools over normal daily number of stools; mild increase in ostomy output.   -  MODERATE (SCALE 4-7): Increase of 4-6 stools daily over normal; moderate increase in ostomy output. * SEVERE (SCALE 8-10; OR 'WORST POSSIBLE'): Increase of 7 or more stools daily over normal; moderate increase in ostomy output; incontinence.     3- patient is not eating or drinking alot 2. ONSET: "When did the diarrhea begin?"      Friday evening after 6 pm 3. BM CONSISTENCY: "How loose or watery is the diarrhea?"      Combination of loose and watery 4. VOMITING: "Are you also vomiting?" If so, ask: "How many times in the past 24 hours?"      Last vomited Saturday afternoon 5. ABDOMINAL PAIN: "Are you having any abdominal pain?" If yes: "What does it feel like?" (e.g., crampy, dull, intermittent, constant)      Yes- cramping and sharp pain worse yesterday 6. ABDOMINAL PAIN SEVERITY: If present, ask: "How bad is the pain?"  (e.g., Scale 1-10; mild, moderate, or  severe)   - MILD (1-3): doesn't interfere with normal activities, abdomen soft and not tender to touch    - MODERATE (4-7): interferes with normal activities or awakens from sleep, tender to touch    - SEVERE (8-10): excruciating pain, doubled over, unable to do any normal activities       Discomfort-4/5 7. ORAL INTAKE: If vomiting, "Have you been able to drink liquids?" "How much fluids have you had in the past 24 hours?"     32 oz 8. HYDRATION: "Any signs of dehydration?" (e.g., dry mouth [not just dry lips], too weak to stand, dizziness, new weight loss) "When did you last urinate?"    No change in weight maybe 1 lb- dry mouth, this morning 7:30 9. EXPOSURE: "Have you traveled to a foreign country recently?" "Have you been exposed to anyone with diarrhea?" "Could you have eaten any food that was spoiled?"     Exposed to C-diff- brother in law- they had visited twice last week, patient thought she might had had food poisoning- she had fish before she started feeling bad - happened within 1-2 hours after eating 10. ANTIBIOTIC USE: "Are you taking antibiotics now or have you taken antibiotics in the past 2 months?"       No  11. OTHER SYMPTOMS: "Do you have any other symptoms?" (  e.g., fever, blood in stool)       no 12. PREGNANCY: "Is there any chance you are pregnant?" "When was your last menstrual period?"       n/a  Protocols used: DIARRHEA-A-AH

## 2019-04-28 NOTE — Discharge Instructions (Signed)
Clear liquids for 24-48 hours, then BRAT diet, then advance slowly to regular diet Imodium AD

## 2019-04-28 NOTE — ED Triage Notes (Signed)
Patient c/o diarrhea and vomiting that started on Friday.  Patient states that the vomiting has resolved but the diarrhea has continued.  Patient states that she has been around her brother-in-law who tested positive for C. Diff. Patient denies fevers.
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
## 2019-04-30 ENCOUNTER — Ambulatory Visit (INDEPENDENT_AMBULATORY_CARE_PROVIDER_SITE_OTHER): Payer: BC Managed Care – PPO | Admitting: Podiatry

## 2019-04-30 ENCOUNTER — Ambulatory Visit (INDEPENDENT_AMBULATORY_CARE_PROVIDER_SITE_OTHER): Payer: BC Managed Care – PPO

## 2019-04-30 ENCOUNTER — Other Ambulatory Visit: Payer: Self-pay

## 2019-04-30 ENCOUNTER — Encounter: Payer: Self-pay | Admitting: Podiatry

## 2019-04-30 DIAGNOSIS — M722 Plantar fascial fibromatosis: Secondary | ICD-10-CM | POA: Diagnosis not present

## 2019-04-30 DIAGNOSIS — M2011 Hallux valgus (acquired), right foot: Secondary | ICD-10-CM | POA: Diagnosis not present

## 2019-04-30 DIAGNOSIS — Z9889 Other specified postprocedural states: Secondary | ICD-10-CM

## 2019-04-30 MED ORDER — MELOXICAM 15 MG PO TABS: 15.0000 mg | ORAL_TABLET | Freq: Every day | ORAL | 3 refills | Status: DC

## 2019-04-30 MED ORDER — METHYLPREDNISOLONE 4 MG PO TBPK: ORAL_TABLET | ORAL | 0 refills | Status: DC

## 2019-04-30 NOTE — Patient Instructions (Signed)

## 2019-04-30 NOTE — Progress Notes (Signed)
She presents today date of surgery 02/28/2019 status post Liane Comber bunionectomy and metatarsal osteotomy fifth right.  States that the forefoot is doing great but my heel is about to kill me I have such pain in it not only what I did to it.  She denies any trauma.  States that it hurts when she gets up on it it is throbbing all day.  Objective: Vital signs are stable she is alert and oriented x3.  Pulses are palpable.  Has great range of motion of the surgical sites.  With mild edema to the forefoot.  She has severe pain on palpation medial calcaneal tubercle of the right heel with no pain on medial and lateral compression of the calcaneus.  Radiographs taken today do not demonstrate any type of osseous abnormalities in these areas.  She does have soft tissue increase in density plantar fashion calcaneal insertion site indicative of plantar fasciitis.  Assessment: Plantar fasciitis right foot.  Well-healing Austin and fifth met osteotomies right foot.  Plan: Discussed etiology pathology conservative versus surgical therapies.  At this point I injected the right heel today with 20 mg Kenalog 5 mg Marcaine point of maximal tenderness.  Tolerated procedure well without complications.  Start her on a Medrol Dosepak to be followed by meloxicam.  Provided her with a plantar fascial brace.  Discussed appropriate shoe gear stretching exercises ice therapy sugar modifications I would like to follow-up with her in 3 weeks.

## 2019-05-06 ENCOUNTER — Encounter: Payer: BC Managed Care – PPO | Admitting: Obstetrics and Gynecology

## 2019-05-07 ENCOUNTER — Encounter: Payer: BC Managed Care – PPO | Admitting: Podiatry

## 2019-05-21 ENCOUNTER — Ambulatory Visit: Payer: BC Managed Care – PPO

## 2019-05-21 ENCOUNTER — Encounter: Payer: Self-pay | Admitting: Podiatry

## 2019-05-21 ENCOUNTER — Other Ambulatory Visit: Payer: Self-pay

## 2019-05-21 ENCOUNTER — Ambulatory Visit (INDEPENDENT_AMBULATORY_CARE_PROVIDER_SITE_OTHER): Payer: Self-pay | Admitting: Podiatry

## 2019-05-21 DIAGNOSIS — M2011 Hallux valgus (acquired), right foot: Secondary | ICD-10-CM

## 2019-05-21 DIAGNOSIS — M722 Plantar fascial fibromatosis: Secondary | ICD-10-CM

## 2019-05-21 DIAGNOSIS — Z9889 Other specified postprocedural states: Secondary | ICD-10-CM

## 2019-05-21 NOTE — Progress Notes (Signed)
She presents today for final postop visit and for an evaluation of her right heel.  She states that her heel pain has about 80% resolved she is doing very well at this point.  Objective: Vital signs are stable alert and oriented x3.  Pulses are palpable.  She has no pain on palpation medial calcaneal tubercle of the right heel she has great range of motion of the first metatarsal phalangeal joint.  Assessment: Well-healing surgical foot.  Well-healing plantar fasciitis.  Plan: Follow-up with me June of next year for reevaluation of the scar.  She will call sooner if needed for fasciitis.

## 2019-05-27 NOTE — Progress Notes (Signed)
Austin bunionectomy with  screw fixation and metatarsal osteotomy with screw fixation right foot.

## 2019-06-03 ENCOUNTER — Telehealth: Payer: Self-pay

## 2019-06-03 ENCOUNTER — Other Ambulatory Visit: Payer: Self-pay | Admitting: Family Medicine

## 2019-06-03 MED ORDER — NURTEC 75 MG PO TBDP: 75.0000 mg | ORAL_TABLET | Freq: Every day | ORAL | 11 refills | Status: DC | PRN

## 2019-06-03 NOTE — Telephone Encounter (Signed)
Pt called to state her headaches have been increasing in the past week. But in the past three days it has been worse. The current regimen of topamax, and verapamil is not helping to alleviate the headaches. She wanted to speak with nurse and wanted to know of Amy NP recommendations. I stated message will be sent to the nurse.

## 2019-06-03 NOTE — Telephone Encounter (Signed)
I called pt back.  I relayed the instructions for her regarding taking nurtec, (not a triptan). Get online or text relating to savings card.  She will try benadryl, and motrin or advil and rest and see if will help.  I relayed per insurance she may have a delay if insurance requires a PA.  She verbalized understanding.  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

## 2019-06-03 NOTE — Telephone Encounter (Signed)
I called pt and she had had bad migraine (today worse level 10, but has come on about 3 days ago. No n/v, increased phono/photo sensitivity  Stress of moving possibly trigger.  She is taking topamax, does not feel decrease of dose the cause.  She is taking her preventatives.  imitrex caused anaphylaxisis.  Has not been in office for infusion.  What have you to offer her to help at this time?

## 2019-06-03 NOTE — Telephone Encounter (Signed)
I am not comfortable calling in another triptan with her history of anaphylaxis with Imitrex. Please let her know that I will try to get Nurtec for her. This is used for abortive therapy. She can take 75mg  at onset of migraine. No more than 1 per day and no more than 10 per month. She can text NSAVE to [phone] for a coupon card that is supposed to bypass insurance if not covered. She can also go online to [employer] to get savings card by email. She can take a benadryl with two Aleve or four ibuprofen as well, then rest. If this does not help we may need to see about another preventative.

## 2019-06-04 ENCOUNTER — Telehealth: Payer: Self-pay | Admitting: *Deleted

## 2019-06-04 NOTE — Telephone Encounter (Addendum)
CMM inititated for Nurtec PA  KEY # [id number]  Determination pending.  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 Has tried imitrex (anaphylaxsis), verapamil, topiramate, meloxicam.  Received approval 06-04-19 thru 10-04-19.  Fax confirmation received [employer].

## 2019-08-13 ENCOUNTER — Encounter: Payer: BC Managed Care – PPO | Admitting: Obstetrics and Gynecology

## 2019-08-24 ENCOUNTER — Other Ambulatory Visit: Payer: Self-pay | Admitting: Podiatry

## 2019-09-08 ENCOUNTER — Telehealth: Payer: Self-pay | Admitting: Obstetrics and Gynecology

## 2019-09-08 NOTE — Telephone Encounter (Signed)
Pt has appt pt needs a order for a mamogram @  [facility]. pt is trying to get appt at [facility] on  the 30th or 31st. Please advise

## 2019-09-10 ENCOUNTER — Ambulatory Visit: Payer: BC Managed Care – PPO | Admitting: Podiatry

## 2019-09-10 NOTE — Telephone Encounter (Signed)
LM for patient to return call to make sure it is just mammogram needed.

## 2019-09-15 ENCOUNTER — Telehealth: Payer: Self-pay | Admitting: Obstetrics and Gynecology

## 2019-09-15 ENCOUNTER — Other Ambulatory Visit: Payer: Self-pay

## 2019-09-15 ENCOUNTER — Encounter: Payer: Self-pay | Admitting: Podiatry

## 2019-09-15 ENCOUNTER — Ambulatory Visit: Payer: BC Managed Care – PPO | Admitting: Podiatry

## 2019-09-15 ENCOUNTER — Ambulatory Visit (INDEPENDENT_AMBULATORY_CARE_PROVIDER_SITE_OTHER): Payer: BC Managed Care – PPO

## 2019-09-15 DIAGNOSIS — M778 Other enthesopathies, not elsewhere classified: Secondary | ICD-10-CM | POA: Diagnosis not present

## 2019-09-15 NOTE — Telephone Encounter (Signed)
Pt called in pt is requesting a order for a mommogram to [facility] she is trying to be seen 12/30 or 12/31. Please advise

## 2019-09-15 NOTE — Progress Notes (Signed)
She presents today complaining of pain beneath the second metatarsal phalangeal joint of the right foot.  She denies any trauma.  Denies any change in her past medical history medications allergies surgeries or social history.  She is status post bunion repair and fifth metatarsal osteotomy on the right foot.  Objective: Vital signs are stable alert oriented x3.  Pulses are palpable.  Radiographs taken today demonstrate no erythema edema or fractures.  She does have a slightly elongated second metatarsal and the screw of the first metatarsal of the fifth metatarsal are intact.  In a good position.  Second toe lies in good position at the level of the metatarsophalangeal joint there is no dorsiflexion the hammertoe deformity.  Assessment: Capsulitis of the second metatarsophalangeal joint right foot.  Plan: Discussed etiology pathology conservative or surgical therapies.  At this point I injected around the second metatarsophalangeal joint with 10 mg Kenalog 5 mg of Marcaine.  Instructed her to wear her surgical shoe and I will follow-up with her in the near future for surgical intervention.

## 2019-09-16 ENCOUNTER — Encounter: Payer: BC Managed Care – PPO | Admitting: Obstetrics and Gynecology

## 2019-09-16 NOTE — Telephone Encounter (Signed)
Spoke with patient and let her know that we would place the order for her mammogram the day of her appointment. He will determine if it needs to be screening or diagnostic. Patient verbalized understanding.

## 2019-09-30 ENCOUNTER — Encounter: Payer: Self-pay | Admitting: Obstetrics and Gynecology

## 2019-09-30 ENCOUNTER — Ambulatory Visit (INDEPENDENT_AMBULATORY_CARE_PROVIDER_SITE_OTHER): Payer: BC Managed Care – PPO | Admitting: Obstetrics and Gynecology

## 2019-09-30 ENCOUNTER — Other Ambulatory Visit: Payer: Self-pay

## 2019-09-30 VITALS — BP 120/83 | HR 91 | Ht 67.5 in | Wt 206.2 lb

## 2019-09-30 DIAGNOSIS — Z1231 Encounter for screening mammogram for malignant neoplasm of breast: Secondary | ICD-10-CM | POA: Diagnosis not present

## 2019-09-30 DIAGNOSIS — Z01419 Encounter for gynecological examination (general) (routine) without abnormal findings: Secondary | ICD-10-CM | POA: Diagnosis not present

## 2019-09-30 MED ORDER — ESTRADIOL 1 MG PO TABS: 1.5000 mg | ORAL_TABLET | Freq: Every day | ORAL | 3 refills | Status: DC

## 2019-09-30 NOTE — Addendum Note (Signed)
Addended by: Elonda Husky on: 09/30/2019 02:54 PM   Modules accepted: Orders
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
## 2019-10-07 ENCOUNTER — Other Ambulatory Visit: Payer: BC Managed Care – PPO

## 2019-10-08 ENCOUNTER — Other Ambulatory Visit: Payer: Self-pay

## 2019-10-08 ENCOUNTER — Other Ambulatory Visit: Payer: BC Managed Care – PPO

## 2019-10-09 LAB — LIPID PANEL
Chol/HDL Ratio: 2.2 ratio (ref 0.0–4.4)
Cholesterol, Total: 187 mg/dL (ref 100–199)
HDL: 86 mg/dL (ref >39)
LDL Chol Calc (NIH): 91 mg/dL (ref 0–99)
Triglycerides: 53 mg/dL (ref 0–149)
VLDL Cholesterol Cal: 10 mg/dL (ref 5–40)

## 2019-10-09 LAB — TSH: TSH: 1.05 u[IU]/mL (ref 0.450–4.500)

## 2019-10-09 LAB — HEMOGLOBIN A1C
Est. average glucose Bld gHb Est-mCnc: 114 mg/dL
Hgb A1c MFr Bld: 5.6 % (ref 4.8–5.6)

## 2019-10-16 ENCOUNTER — Telehealth: Payer: Self-pay | Admitting: Obstetrics and Gynecology

## 2019-10-16 ENCOUNTER — Telehealth: Payer: Self-pay | Admitting: *Deleted

## 2019-10-16 ENCOUNTER — Ambulatory Visit
Admission: RE | Admit: 2019-10-16 | Discharge: 2019-10-16 | Disposition: A | Discharge status: Home / Self Care | Payer: BC Managed Care – PPO | Source: Ambulatory Visit | Attending: Obstetrics and Gynecology | Admitting: Obstetrics and Gynecology

## 2019-10-16 DIAGNOSIS — Z1231 Encounter for screening mammogram for malignant neoplasm of breast: Secondary | ICD-10-CM | POA: Insufficient documentation

## 2019-10-16 NOTE — Telephone Encounter (Signed)
The pt came in for a annual and had  labs done the pt was charged for her [facility] lab.   It was run as a prevented test, the pt was told that if the test was coded as diagnostic test that she would not be charged, she would just pay her co pay. Pt is requesting a call back. Please advise

## 2019-10-16 NOTE — Telephone Encounter (Signed)
Received fax notification from [facility] that PA approved 10/16/19-10/15/20. PA# [facility] Plan [phone] Non-grandfathered [id number]

## 2019-10-16 NOTE — Telephone Encounter (Signed)
Submitted PA Nurtec on CMM. Key: [id number]. Waiting on determination from [facility].

## 2019-10-20 NOTE — Telephone Encounter (Signed)
Spoke with Crystal from [place] and she is going to look into this for me.

## 2019-10-20 NOTE — Telephone Encounter (Signed)
Spoke with Crystal from [facility] and she said that because the test was ordered under proactive instead of diagnostic there was nothing that could be done about cost. She did say that it was very confusing the way that it was worded. She is going to check with the company to see if they can help with the cost. She will get back with me.

## 2019-10-20 NOTE — Telephone Encounter (Signed)
Spoke with patient and she stated that she was not told that she would get a bill for the test. She said that Dr. Logan Bores told her that if she has not meet her deductible then she would have a copay. Patient also stated that she spoke with [facility] and they told her that if it was coded as a diagnostic test it would be covered. I will call [facility] to check on this.

## 2019-10-23 NOTE — Telephone Encounter (Signed)
Spoke with Crystal from [place] and they was able to switch the account to where they billed [employer]. I have notified the patient of this.

## 2019-11-05 ENCOUNTER — Other Ambulatory Visit: Payer: Self-pay

## 2019-11-05 ENCOUNTER — Ambulatory Visit: Payer: BC Managed Care – PPO | Admitting: Podiatry

## 2019-11-05 ENCOUNTER — Ambulatory Visit: Payer: BC Managed Care – PPO

## 2019-11-05 ENCOUNTER — Encounter: Payer: Self-pay | Admitting: Podiatry

## 2019-11-05 DIAGNOSIS — M778 Other enthesopathies, not elsewhere classified: Secondary | ICD-10-CM | POA: Diagnosis not present

## 2019-11-05 NOTE — Progress Notes (Signed)
She presents today chief complaint of pain at the level of the second metatarsophalangeal joint of the second toe.  She states that when she is walking it feels like something is rolling at the level of the PIPJ plantar medial aspect.  Is also complaining of pain at the subsecond metatarsal phalangeal joint level of the right foot.  Objective: Vital signs are stable alert and oriented x3 there is no erythema edema cellulitis drainage odor she has tenderness on forced plantar flexion at the level of the second metatarsophalangeal joint and pain on palpation of the joint in general.  She also has tenderness on palpation of the plantar digital nerve medially.  Radiographs taken today do not demonstrate any type of osseous abnormalities.  Internal fixation is in good position first.  Assessment: Neuritis of the second toe medial aspect plantar digital nerve right.  Also capsulitis of the second metatarsophalangeal joint.  Plan: At this point I injected dexamethasone and local anesthetic to the level of the second metatarsophalangeal joint extra Betadine skin prep.  Also injected the second toe plantar medial digital nerve.  She tolerated procedure well without complications follow-up with her as needed.

## 2019-11-22 ENCOUNTER — Ambulatory Visit: Payer: BC Managed Care – PPO | Attending: Internal Medicine

## 2019-11-22 DIAGNOSIS — Z23 Encounter for immunization: Secondary | ICD-10-CM | POA: Insufficient documentation

## 2019-11-22 NOTE — Progress Notes (Signed)
   Covid-19 Vaccination Clinic  Name:  Brittney Johns    MRN: [id number] DOB: 22-Jan-1971  11/22/2019  Brittney Johns was observed post Covid-19 immunization for 30 minutes based on pre-vaccination screening without incident. She was provided with Vaccine Information Sheet and instruction to access the V-Safe system.   Brittney Johns was instructed to call 911 with any severe reactions post vaccine: Marland Kitchen Difficulty breathing  . Swelling of face and throat  . A fast heartbeat  . A bad rash all over body  . Dizziness and weakness   Immunizations Administered    Name Date Dose VIS Date Route   Moderna COVID-19 Vaccine 11/22/2019  2:45 PM 0.5 mL 08/19/2019 Intramuscular   Manufacturer: Moderna   Lot: [id number]   NDC: 68372-902-11

## 2019-12-20 ENCOUNTER — Ambulatory Visit: Payer: BC Managed Care – PPO | Attending: Internal Medicine

## 2019-12-20 DIAGNOSIS — Z23 Encounter for immunization: Secondary | ICD-10-CM

## 2019-12-20 NOTE — Progress Notes (Signed)
   Covid-19 Vaccination Clinic  Name:  Brittney Johns    MRN: [id number] DOB: [birth year]  12/20/2019  [name] was observed post Covid-19 immunization for 15 minutes without incident. She was provided with Vaccine Information Sheet and instruction to access the V-Safe system.   [name] was instructed to call 911 with any severe reactions post vaccine: Marland Kitchen Difficulty breathing  . Swelling of face and throat  . A fast heartbeat  . A bad rash all over body  . Dizziness and weakness   Immunizations Administered    Name Date Dose VIS Date Route   Moderna COVID-19 Vaccine 12/20/2019  8:11 AM 0.5 mL 08/19/2019 Intramuscular   Manufacturer: Gala Murdoch   Lot: [id number]   NDC: 47998-001-23

## 2020-01-22 ENCOUNTER — Ambulatory Visit: Payer: BC Managed Care – PPO | Admitting: Family Medicine

## 2020-01-22 ENCOUNTER — Encounter: Payer: Self-pay | Admitting: Family Medicine

## 2020-01-22 ENCOUNTER — Other Ambulatory Visit: Payer: Self-pay

## 2020-01-22 VITALS — BP 115/76 | HR 83 | Temp 97.3°F | Ht 67.0 in | Wt 198.0 lb

## 2020-01-22 DIAGNOSIS — H9202 Otalgia, left ear: Secondary | ICD-10-CM

## 2020-01-22 DIAGNOSIS — G43009 Migraine without aura, not intractable, without status migrainosus: Secondary | ICD-10-CM

## 2020-01-22 DIAGNOSIS — R0683 Snoring: Secondary | ICD-10-CM

## 2020-01-22 MED ORDER — TOPIRAMATE 50 MG PO TABS: ORAL_TABLET | ORAL | 3 refills | Status: DC

## 2020-01-22 MED ORDER — VERAPAMIL HCL 80 MG PO TABS: ORAL_TABLET | ORAL | 3 refills | Status: DC

## 2020-01-22 MED ORDER — GABAPENTIN 100 MG PO CAPS: 100.0000 mg | ORAL_CAPSULE | Freq: Three times a day (TID) | ORAL | 11 refills | Status: DC

## 2020-01-22 NOTE — Patient Instructions (Addendum)
We will continue topiramate 50mg  daily and verapimil 80mg  twice daily for migraine prevention. We will continue Nurtec for abortive therapy  We will try gabapentin 100mg  up to three times daily for left ear pain. I am concerned this may be trigeminal neuralgia.  Stay well hydrated. Focus on healthy sleep patterns and healthy lifestyle habits.   Follow up in 6 months    Gabapentin capsules or tablets What is this medicine? GABAPENTIN (GA ba pen tin) is used to control seizures in certain types of epilepsy. It is also used to treat certain types of nerve pain. This medicine may be used for other purposes; ask your health care provider or pharmacist if you have questions. COMMON BRAND NAME(S): Active-PAC with Gabapentin, Gabarone, Neurontin What should I tell my health care provider before I take this medicine? They need to know if you have any of these conditions:  history of drug abuse or alcohol abuse problem  kidney disease  lung or breathing disease  suicidal thoughts, plans, or attempt; a previous suicide attempt by you or a family member  an unusual or allergic reaction to gabapentin, other medicines, foods, dyes, or preservatives  pregnant or trying to get pregnant  breast-feeding How should I use this medicine? Take this medicine by mouth with a glass of water. Follow the directions on the prescription label. You can take it with or without food. If it upsets your stomach, take it with food. Take your medicine at regular intervals. Do not take it more often than directed. Do not stop taking except on your doctor's advice. If you are directed to break the 600 or 800 mg tablets in half as part of your dose, the extra half tablet should be used for the next dose. If you have not used the extra half tablet within 28 days, it should be thrown away. A special MedGuide will be given to you by the pharmacist with each prescription and refill. Be sure to read this information  carefully each time. Talk to your pediatrician regarding the use of this medicine in children. While this drug may be prescribed for children as young as 3 years for selected conditions, precautions do apply. Overdosage: If you think you have taken too much of this medicine contact a poison control center or emergency room at once. NOTE: This medicine is only for you. Do not share this medicine with others. What if I miss a dose? If you miss a dose, take it as soon as you can. If it is almost time for your next dose, take only that dose. Do not take double or extra doses. What may interact with this medicine? This medicine may interact with the following medications:  alcohol  antihistamines for allergy, cough, and cold  certain medicines for anxiety or sleep  certain medicines for depression like amitriptyline, fluoxetine, sertraline  certain medicines for seizures like phenobarbital, primidone  certain medicines for stomach problems  general anesthetics like halothane, isoflurane, methoxyflurane, propofol  local anesthetics like lidocaine, pramoxine, tetracaine  medicines that relax muscles for surgery  narcotic medicines for pain  phenothiazines like chlorpromazine, mesoridazine, prochlorperazine, thioridazine This list may not describe all possible interactions. Give your health care provider a list of all the medicines, herbs, non-prescription drugs, or dietary supplements you use. Also tell them if you smoke, drink alcohol, or use illegal drugs. Some items may interact with your medicine. What should I watch for while using this medicine? Visit your doctor or health care provider for regular  checks on your progress. You may want to keep a record at home of how you feel your condition is responding to treatment. You may want to share this information with your doctor or health care provider at each visit. You should contact your doctor or health care provider if your seizures get  worse or if you have any new types of seizures. Do not stop taking this medicine or any of your seizure medicines unless instructed by your doctor or health care provider. Stopping your medicine suddenly can increase your seizures or their severity. This medicine may cause serious skin reactions. They can happen weeks to months after starting the medicine. Contact your health care provider right away if you notice fevers or flu-like symptoms with a rash. The rash may be red or purple and then turn into blisters or peeling of the skin. Or, you might notice a red rash with swelling of the face, lips or lymph nodes in your neck or under your arms. Wear a medical identification bracelet or chain if you are taking this medicine for seizures, and carry a card that lists all your medications. You may get drowsy, dizzy, or have blurred vision. Do not drive, use machinery, or do anything that needs mental alertness until you know how this medicine affects you. To reduce dizzy or fainting spells, do not sit or stand up quickly, especially if you are an older patient. Alcohol can increase drowsiness and dizziness. Avoid alcoholic drinks. Your mouth may get dry. Chewing sugarless gum or sucking hard candy, and drinking plenty of water will help. The use of this medicine may increase the chance of suicidal thoughts or actions. Pay special attention to how you are responding while on this medicine. Any worsening of mood, or thoughts of suicide or dying should be reported to your health care provider right away. Women who become pregnant while using this medicine may enroll in the [place] [employer] Antiepileptic Drug Pregnancy Registry by calling [phone]. This registry collects information about the safety of antiepileptic drug use during pregnancy. What side effects may I notice from receiving this medicine? Side effects that you should report to your doctor or health care professional as soon as possible:  allergic  reactions like skin rash, itching or hives, swelling of the face, lips, or tongue  breathing problems  rash, fever, and swollen lymph nodes  redness, blistering, peeling or loosening of the skin, including inside the mouth  suicidal thoughts, mood changes Side effects that usually do not require medical attention (report to your doctor or health care professional if they continue or are bothersome):  dizziness  drowsiness  headache  nausea, vomiting  swelling of ankles, feet, hands  tiredness This list may not describe all possible side effects. Call your doctor for medical advice about side effects. You may report side effects to FDA at 1-800-FDA-1088. Where should I keep my medicine? Keep out of reach of children. This medicine may cause accidental overdose and death if it taken by other adults, children, or pets. Mix any unused medicine with a substance like cat litter or coffee grounds. Then throw the medicine away in a sealed container like a sealed bag or a coffee can with a lid. Do not use the medicine after the expiration date. Store at room temperature between 15 and 30 degrees C (59 and 86 degrees F). NOTE: This sheet is a summary. It may not cover all possible information. If you have questions about this medicine, talk to your doctor,  pharmacist, or health care provider.  2020 Elsevier/Gold Standard (2018-12-06 14:16:43)     Trigeminal Neuralgia  Trigeminal neuralgia is a nerve disorder that causes severe pain on one side of the face. The pain may last from a few seconds to several minutes. The pain is usually only on one side of the face. Symptoms may occur for days, weeks, or months and then go away for months or years. The pain may return and be worse than before. What are the causes? This condition is caused by damage or pressure to a nerve in the head that is called the trigeminal nerve. An attack can be triggered by:  Talking.  Chewing.  Putting on  makeup.  Washing your face.  Shaving your face.  Brushing your teeth.  Touching your face. What increases the risk? You are more likely to develop this condition if you:  Are 38 years of age or older.  Are female. What are the signs or symptoms? The main symptom of this condition is severe pain in the:  Jaw.  Lips.  Eyes.  Nose.  Scalp.  Forehead.  Face. The pain may be:  Intense.  Stabbing.  Electric.  Shock-like. How is this diagnosed? This condition is diagnosed with a physical exam. A CT scan or an MRI may be done to rule out other conditions that can cause facial pain. How is this treated? This condition may be treated with:  Avoiding the things that trigger your symptoms.  Taking prescription medicines (anticonvulsants).  Having surgery. This may be done in severe cases if other medical treatment does not provide relief.  Having procedures such as ablation, thermal, or radiation therapy. It may take up to one month for treatment to start relieving the pain. Follow these instructions at home: Managing pain  Learn as much as you can about how to manage your pain. Ask your health care provider if a pain specialist would be helpful.  Consider talking with a mental health care provider (psychologist) about how to cope with the pain.  Consider joining a pain support group. General instructions  Take over-the-counter and prescription medicines only as told by your health care provider.  Avoid the things that trigger your symptoms. It may help to: ? Chew on the unaffected side of your mouth. ? Avoid touching your face. ? Avoid blasts of hot or cold air.  Follow your treatment plan as told by your health care provider. This may include: ? Cognitive or behavioral therapy. ? Gentle, regular exercise. ? Meditation or yoga. ? Aromatherapy.  Keep all follow-up visits as told by your health care provider. You may need to be monitored closely to make  sure treatment is working well for you. Where to find more information  Facial Pain Association: fpa-support.org Contact a health care provider if:  Your medicine is not helping your symptoms.  You have side effects from the medicine used for treatment.  You develop new, unexplained symptoms, such as: ? Double vision. ? Facial weakness. ? Facial numbness. ? Changes in hearing or balance.  You feel depressed. Get help right away if:  Your pain is severe and is not getting better.  You develop suicidal thoughts. If you ever feel like you may hurt yourself or others, or have thoughts about taking your own life, get help right away. You can go to your nearest emergency department or call:  Your local emergency services (911 in the [place]  A suicide crisis helpline, such as the National Suicide Prevention  Lifeline at 979-588-31171-336-268-8604. This is open 24 hours a day. Summary  Trigeminal neuralgia is a nerve disorder that causes severe pain on one side of the face. The pain may last from a few seconds to several minutes.  This condition is caused by damage or pressure to a nerve in the head that is called the trigeminal nerve.  Treatment may include avoiding the things that trigger your symptoms, taking medicines, or having surgery or procedures. It may take up to one month for treatment to start relieving the pain.  Avoid the things that trigger your symptoms.  Keep all follow-up visits as told by your health care provider. You may need to be monitored closely to make sure treatment is working well for you. This information is not intended to replace advice given to you by your health care provider. Make sure you discuss any questions you have with your health care provider. Document Revised: 07/22/2018 Document Reviewed: 07/22/2018 Elsevier Patient Education  2020 [employer] Inc[employer]   Sleep Apnea Sleep apnea affects breathing during sleep. It causes breathing to stop for a short time or  to become shallow. It can also increase the risk of:  Heart attack.  Stroke.  Being very overweight (obese).  Diabetes.  Heart failure.  Irregular heartbeat. The goal of treatment is to help you breathe normally again. What are the causes? There are three kinds of sleep apnea:  Obstructive sleep apnea. This is caused by a blocked or collapsed airway.  Central sleep apnea. This happens when the brain does not send the right signals to the muscles that control breathing.  Mixed sleep apnea. This is a combination of obstructive and central sleep apnea. The most common cause of this condition is a collapsed or blocked airway. This can happen if:  Your throat muscles are too relaxed.  Your tongue and tonsils are too large.  You are overweight.  Your airway is too small. What increases the risk?  Being overweight.  Smoking.  Having a small airway.  Being older.  Being female.  Drinking alcohol.  Taking medicines to calm yourself (sedatives or tranquilizers).  Having family members with the condition. What are the signs or symptoms?  Trouble staying asleep.  Being sleepy or tired during the day.  Getting angry a lot.  Loud snoring.  Headaches in the morning.  Not being able to focus your mind (concentrate).  Forgetting things.  Less interest in sex.  Mood swings.  Personality changes.  Feelings of sadness (depression).  Waking up a lot during the night to pee (urinate).  Dry mouth.  Sore throat. How is this diagnosed?  Your medical history.  A physical exam.  A test that is done when you are sleeping (sleep study). The test is most often done in a sleep lab but may also be done at home. How is this treated?   Sleeping on your side.  Using a medicine to get rid of mucus in your nose (decongestant).  Avoiding the use of alcohol, medicines to help you relax, or certain pain medicines (narcotics).  Losing weight, if needed.  Changing  your diet.  Not smoking.  Using a machine to open your airway while you sleep, such as: ? An oral appliance. This is a mouthpiece that shifts your lower jaw forward. ? A CPAP device. This device blows air through a mask when you breathe out (exhale). ? An EPAP device. This has valves that you put in each nostril. ? A BPAP device. This  device blows air through a mask when you breathe in (inhale) and breathe out.  Having surgery if other treatments do not work. It is important to get treatment for sleep apnea. Without treatment, it can lead to:  High blood pressure.  Coronary artery disease.  In men, not being able to have an erection (impotence).  Reduced thinking ability. Follow these instructions at home: Lifestyle  Make changes that your doctor recommends.  Eat a healthy diet.  Lose weight if needed.  Avoid alcohol, medicines to help you relax, and some pain medicines.  Do not use any products that contain nicotine or tobacco, such as cigarettes, e-cigarettes, and chewing tobacco. If you need help quitting, ask your doctor. General instructions  Take over-the-counter and prescription medicines only as told by your doctor.  If you were given a machine to use while you sleep, use it only as told by your doctor.  If you are having surgery, make sure to tell your doctor you have sleep apnea. You may need to bring your device with you.  Keep all follow-up visits as told by your doctor. This is important. Contact a doctor if:  The machine that you were given to use during sleep bothers you or does not seem to be working.  You do not get better.  You get worse. Get help right away if:  Your chest hurts.  You have trouble breathing in enough air.  You have an uncomfortable feeling in your back, arms, or stomach.  You have trouble talking.  One side of your body feels weak.  A part of your face is hanging down. These symptoms may be an emergency. Do not wait to  see if the symptoms will go away. Get medical help right away. Call your local emergency services (911 in the [place] Do not drive yourself to the hospital. Summary  This condition affects breathing during sleep.  The most common cause is a collapsed or blocked airway.  The goal of treatment is to help you breathe normally while you sleep. This information is not intended to replace advice given to you by your health care provider. Make sure you discuss any questions you have with your health care provider. Document Revised: 06/21/2018 Document Reviewed: 04/30/2018 Elsevier Patient Education  [place].
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
## 2020-01-27 NOTE — Progress Notes (Signed)
I have reviewed and agreed above plan. 

## 2020-01-31 ENCOUNTER — Other Ambulatory Visit: Payer: Self-pay | Admitting: Podiatry

## 2020-03-08 ENCOUNTER — Ambulatory Visit (INDEPENDENT_AMBULATORY_CARE_PROVIDER_SITE_OTHER): Payer: BC Managed Care – PPO | Admitting: Podiatry

## 2020-03-08 ENCOUNTER — Encounter: Payer: Self-pay | Admitting: Podiatry

## 2020-03-08 ENCOUNTER — Other Ambulatory Visit: Payer: Self-pay

## 2020-03-08 DIAGNOSIS — Q828 Other specified congenital malformations of skin: Secondary | ICD-10-CM | POA: Diagnosis not present

## 2020-03-08 DIAGNOSIS — G5791 Unspecified mononeuropathy of right lower limb: Secondary | ICD-10-CM

## 2020-03-08 DIAGNOSIS — M778 Other enthesopathies, not elsewhere classified: Secondary | ICD-10-CM

## 2020-03-08 MED ORDER — METHYLPREDNISOLONE 4 MG PO TBPK: ORAL_TABLET | ORAL | 0 refills | Status: DC

## 2020-03-08 NOTE — Progress Notes (Signed)
She presents today still complaining of pain around the second metatarsophalangeal joint of the right foot.  States that is been really bad for the past 2 weeks.  States that she thinks that some of her habitus may be to blame wearing flip-flops sandals bare feet and sitting with her toes bent back all of the time.  She has no problems with the first and fifth metatarsals or toes.  These are the ones she had surgery on.  She is doing very well with them.  Objective: Vital signs are stable alert oriented x3 the foot is rectus in position there is no erythema cellulitis drainage or odor she has tenderness on palpation dorsally and plantarly at the second metatarsophalangeal joint.  Mild reactive hyperkeratotic lesion plantar aspect of the second metatarsophalangeal joint bilaterally right greater than left.  She has pain on end range of motion of the second metatarsophalangeal joint and on distraction of the toe and palpation of the joint dorsal medial.  Assessment: Capsulitis second metatarsophalangeal joint.  Reactive hyperkeratotic lesion plantar aspect second metatarsophalangeal joint right.  Plan: After Betadine skin prep I injected 2 mg of dexamethasone local anesthetic directly into the second metatarsophalangeal joint right foot.  I debrided the reactive hyperkeratotic lesion for her.  We did discuss the possible need for orthotics and/or surgery.  Follow-up with her in 4 to 6 weeks.

## 2020-03-22 ENCOUNTER — Other Ambulatory Visit: Payer: Self-pay | Admitting: Podiatry

## 2020-04-06 ENCOUNTER — Ambulatory Visit (INDEPENDENT_AMBULATORY_CARE_PROVIDER_SITE_OTHER): Payer: BC Managed Care – PPO | Admitting: Podiatry

## 2020-04-06 ENCOUNTER — Other Ambulatory Visit: Payer: Self-pay | Admitting: Podiatry

## 2020-04-06 ENCOUNTER — Encounter: Payer: Self-pay | Admitting: Podiatry

## 2020-04-06 ENCOUNTER — Other Ambulatory Visit: Payer: Self-pay

## 2020-04-06 ENCOUNTER — Ambulatory Visit (INDEPENDENT_AMBULATORY_CARE_PROVIDER_SITE_OTHER): Payer: BC Managed Care – PPO

## 2020-04-06 DIAGNOSIS — M778 Other enthesopathies, not elsewhere classified: Secondary | ICD-10-CM

## 2020-04-06 DIAGNOSIS — M216X1 Other acquired deformities of right foot: Secondary | ICD-10-CM | POA: Diagnosis not present

## 2020-04-06 DIAGNOSIS — G5791 Unspecified mononeuropathy of right lower limb: Secondary | ICD-10-CM

## 2020-04-06 NOTE — Progress Notes (Signed)
She presents today complaining of capsulitis second metatarsophalangeal joint.  States is burning somatic and hardly stand it as she refers to the plantar aspect second metatarsal phalangeal joint of the right foot.  States that is no longer hurting dorsally all plantarly.  Objective: Vital signs stable alert oriented x3 pulses are palpable.  Still has pain on plantarflexion against resistance and sharp plantar flexion at the second metatarsophalangeal joint.  She has tenderness on palpation of the joint.  Radiographs taken today demonstrate only first and fifth metatarsal osteotomies with internal fixation intact and in good position.  Slightly elongated second metatarsal head associated with capsulitis most likely.  Assessment: Capsulitis second metatarsophalangeal joint plantarly.  Plan: This point I injected 2 mg of dexamethasone local anesthetic from the plantar aspect.  She tolerated this well without complications.  We did discuss possible need for surgery.

## 2020-04-28 ENCOUNTER — Other Ambulatory Visit: Payer: Self-pay

## 2020-04-28 ENCOUNTER — Ambulatory Visit: Payer: BC Managed Care – PPO | Admitting: Podiatry

## 2020-04-28 ENCOUNTER — Encounter: Payer: Self-pay | Admitting: Podiatry

## 2020-04-28 DIAGNOSIS — M778 Other enthesopathies, not elsewhere classified: Secondary | ICD-10-CM

## 2020-04-28 NOTE — Progress Notes (Signed)
She presents today states that the numbness is still present but her foot is doing much better she refers to the second metatarsal phalangeal joint of the right foot.  States that she just come back from the beach with her husband and they did fine while she was there she says she has been wearing her flat surgical shoe for the back the past month.  Objective: Vital signs are stable alert and oriented x3.  Pulses are palpable.  She has pain on palpation and distraction of the second metatarsophalangeal joint with no dorsiflexion of the toe.  Assessment: Capsulitis second metatarsophalangeal joint right foot.  Plan: Last time she was in we injected 2 mg of dexamethasone plantarly for the tendons.  At this point were going injected into the joint at the second metatarsophalangeal and after sterile Betadine skin prep.  She tolerated this procedure well I encouraged her to continue to wear the Darco shoe for the next few days before going back to work.

## 2020-06-09 ENCOUNTER — Ambulatory Visit: Payer: BC Managed Care – PPO | Admitting: Podiatry

## 2020-06-28 ENCOUNTER — Other Ambulatory Visit: Payer: Self-pay

## 2020-06-28 ENCOUNTER — Encounter: Payer: Self-pay | Admitting: Podiatry

## 2020-06-28 ENCOUNTER — Ambulatory Visit: Payer: BC Managed Care – PPO | Admitting: Podiatry

## 2020-06-28 DIAGNOSIS — M778 Other enthesopathies, not elsewhere classified: Secondary | ICD-10-CM | POA: Diagnosis not present

## 2020-06-28 NOTE — Progress Notes (Signed)
She states that it is not nearly as painful as it was but is still sore under their just enough to limit me from doing what I would like to do.  She is referring to the second metatarsophalangeal joint of the right foot.  Objective: Vital signs are stable alert oriented x3.  Pulses are palpable.  She still has some tenderness on palpation and on end range of motion of the second metatarsophalangeal joint.  Assessment: Capsulitis sec metatarsophalangeal joint right foot.  Plan: Instructed her on the use of Voltaren gel and also she will follow up with [facility] to have a set of orthotics made that we will offload the second metatarsal head.

## 2020-06-29 ENCOUNTER — Other Ambulatory Visit: Payer: Self-pay | Admitting: Obstetrics and Gynecology

## 2020-06-29 DIAGNOSIS — Z01419 Encounter for gynecological examination (general) (routine) without abnormal findings: Secondary | ICD-10-CM

## 2020-06-30 ENCOUNTER — Encounter: Payer: Self-pay | Admitting: Podiatry

## 2020-07-01 MED ORDER — TRAMADOL HCL 50 MG PO TABS: 50.0000 mg | ORAL_TABLET | Freq: Three times a day (TID) | ORAL | 0 refills | Status: DC | PRN

## 2020-07-14 ENCOUNTER — Other Ambulatory Visit: Payer: Self-pay

## 2020-07-14 ENCOUNTER — Ambulatory Visit (INDEPENDENT_AMBULATORY_CARE_PROVIDER_SITE_OTHER): Payer: BC Managed Care – PPO | Admitting: Orthotics

## 2020-07-14 DIAGNOSIS — G5791 Unspecified mononeuropathy of right lower limb: Secondary | ICD-10-CM | POA: Diagnosis not present

## 2020-07-14 DIAGNOSIS — M216X1 Other acquired deformities of right foot: Secondary | ICD-10-CM

## 2020-07-14 DIAGNOSIS — M778 Other enthesopathies, not elsewhere classified: Secondary | ICD-10-CM

## 2020-07-14 DIAGNOSIS — M7752 Other enthesopathy of left foot: Secondary | ICD-10-CM

## 2020-07-14 NOTE — Progress Notes (Signed)
Cast today for CMFO to offload 2nd met R.  Plan on offload w/ small dispersion pad proximal to offload.

## 2020-07-28 ENCOUNTER — Ambulatory Visit: Payer: BC Managed Care – PPO | Admitting: Family Medicine

## 2020-08-13 DIAGNOSIS — T7840XA Allergy, unspecified, initial encounter: Secondary | ICD-10-CM | POA: Insufficient documentation

## 2020-08-18 ENCOUNTER — Ambulatory Visit: Payer: BC Managed Care – PPO | Admitting: Orthotics

## 2020-08-18 ENCOUNTER — Other Ambulatory Visit: Payer: Self-pay

## 2020-08-18 DIAGNOSIS — Q828 Other specified congenital malformations of skin: Secondary | ICD-10-CM

## 2020-08-18 DIAGNOSIS — G5791 Unspecified mononeuropathy of right lower limb: Secondary | ICD-10-CM

## 2020-08-18 DIAGNOSIS — M216X1 Other acquired deformities of right foot: Secondary | ICD-10-CM

## 2020-08-18 DIAGNOSIS — M778 Other enthesopathies, not elsewhere classified: Secondary | ICD-10-CM

## 2020-08-18 NOTE — Progress Notes (Addendum)
Patient picked up f/o and was pleased with fit, comfort, and function.  Worked well with footwear.  Told of rbeak in period and how to report any issues.  

## 2020-08-20 ENCOUNTER — Other Ambulatory Visit: Payer: Self-pay | Admitting: Podiatry

## 2020-09-07 ENCOUNTER — Telehealth: Payer: Self-pay | Admitting: Podiatry

## 2020-09-07 NOTE — Telephone Encounter (Signed)
Pt left message stating she wanted to talk to [facility] and wanted to order a second pair of orthotics for her dress shoes and needs them to be more narrow.

## 2020-09-08 ENCOUNTER — Telehealth: Payer: Self-pay | Admitting: Podiatry

## 2020-09-08 DIAGNOSIS — M7752 Other enthesopathy of left foot: Secondary | ICD-10-CM

## 2020-09-08 DIAGNOSIS — M216X1 Other acquired deformities of right foot: Secondary | ICD-10-CM

## 2020-09-08 NOTE — Telephone Encounter (Signed)
Pt called and left message again stating she wanted to order a second pair of orthotics but wants the new ones to be a dress pair of orthotics.   I returned call and left message that I would tell this to [facility] and when they come in we would call her to come pick them up.Marland KitchenMarland Kitchen

## 2020-10-06 ENCOUNTER — Encounter: Payer: Self-pay | Admitting: Obstetrics and Gynecology

## 2020-10-06 ENCOUNTER — Other Ambulatory Visit: Payer: Self-pay

## 2020-10-06 ENCOUNTER — Ambulatory Visit (INDEPENDENT_AMBULATORY_CARE_PROVIDER_SITE_OTHER): Payer: BC Managed Care – PPO | Admitting: Obstetrics and Gynecology

## 2020-10-06 VITALS — BP 123/83 | HR 87 | Ht 67.0 in | Wt 206.0 lb

## 2020-10-06 DIAGNOSIS — Z01419 Encounter for gynecological examination (general) (routine) without abnormal findings: Secondary | ICD-10-CM

## 2020-10-06 DIAGNOSIS — Z79890 Hormone replacement therapy (postmenopausal): Secondary | ICD-10-CM

## 2020-10-06 DIAGNOSIS — Z1231 Encounter for screening mammogram for malignant neoplasm of breast: Secondary | ICD-10-CM

## 2020-10-06 MED ORDER — ESTRADIOL 1 MG PO TABS: ORAL_TABLET | ORAL | 1 refills | Status: DC
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
## 2020-10-07 ENCOUNTER — Other Ambulatory Visit: Payer: Self-pay

## 2020-10-08 LAB — LIPID PANEL
Chol/HDL Ratio: 2.3 ratio (ref 0.0–4.4)
Cholesterol, Total: 181 mg/dL (ref 100–199)
HDL: 78 mg/dL (ref >39)
LDL Chol Calc (NIH): 89 mg/dL (ref 0–99)
Triglycerides: 79 mg/dL (ref 0–149)
VLDL Cholesterol Cal: 14 mg/dL (ref 5–40)

## 2020-10-08 LAB — HEMOGLOBIN A1C
Est. average glucose Bld gHb Est-mCnc: 120 mg/dL
Hgb A1c MFr Bld: 5.8 % — ABNORMAL HIGH (ref 4.8–5.6)

## 2020-10-08 LAB — TSH: TSH: 0.904 u[IU]/mL (ref 0.450–4.500)

## 2020-10-21 ENCOUNTER — Telehealth: Payer: Self-pay

## 2020-10-21 NOTE — Telephone Encounter (Signed)
Detailed voice mail message left for patient re: Dr Logan Bores instructions.

## 2020-10-28 ENCOUNTER — Ambulatory Visit: Payer: BC Managed Care – PPO | Admitting: Family Medicine

## 2020-10-28 ENCOUNTER — Encounter: Payer: Self-pay | Admitting: Family Medicine

## 2020-10-28 VITALS — BP 119/81 | HR 88 | Ht 67.0 in | Wt 204.0 lb

## 2020-10-28 DIAGNOSIS — G43009 Migraine without aura, not intractable, without status migrainosus: Secondary | ICD-10-CM | POA: Diagnosis not present

## 2020-10-28 MED ORDER — GABAPENTIN 100 MG PO CAPS: 100.0000 mg | ORAL_CAPSULE | Freq: Three times a day (TID) | ORAL | 3 refills | Status: DC

## 2020-10-28 MED ORDER — VERAPAMIL HCL 80 MG PO TABS: ORAL_TABLET | ORAL | 3 refills | Status: DC

## 2020-10-28 MED ORDER — TOPIRAMATE 50 MG PO TABS: ORAL_TABLET | ORAL | 3 refills | Status: DC

## 2020-10-28 MED ORDER — NURTEC 75 MG PO TBDP: 75.0000 mg | ORAL_TABLET | Freq: Every day | ORAL | 11 refills | Status: DC | PRN
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
## 2020-10-28 NOTE — Patient Instructions (Signed)
Below is our plan:  We will continue current treatment plan.   Please make sure you are staying well hydrated. I recommend 50-60 ounces daily. Well balanced diet and regular exercise encouraged. Consistent sleep schedule with 6-8 hours recommended.   Please continue follow up with care team as directed.   Follow up in 1 year, sooner if needed   You may receive a survey regarding today's visit. I encourage you to leave honest feed back as I do use this information to improve patient care. Thank you for seeing me today!      Migraine Headache A migraine headache is a very strong throbbing pain on one side or both sides of your head. This type of headache can also cause other symptoms. It can last from 4 hours to 3 days. Talk with your doctor about what things may bring on (trigger) this condition. What are the causes? The exact cause of this condition is not known. This condition may be triggered or caused by:  Drinking alcohol.  Smoking.  Taking medicines, such as: ? Medicine used to treat chest pain (nitroglycerin). ? Birth control pills. ? Estrogen. ? Some blood pressure medicines.  Eating or drinking certain products.  Doing physical activity. Other things that may trigger a migraine headache include:  Having a menstrual period.  Pregnancy.  Hunger.  Stress.  Not getting enough sleep or getting too much sleep.  Weather changes.  Tiredness (fatigue). What increases the risk?  Being 27-34 years old.  Being female.  Having a family history of migraine headaches.  Being Caucasian.  Having depression or anxiety.  Being very overweight. What are the signs or symptoms?  A throbbing pain. This pain may: ? Happen in any area of the head, such as on one side or both sides. ? Make it hard to do daily activities. ? Get worse with physical activity. ? Get worse around bright lights or loud noises.  Other symptoms may include: ? Feeling sick to your stomach  (nauseous). ? Vomiting. ? Dizziness. ? Being sensitive to bright lights, loud noises, or smells.  Before you get a migraine headache, you may get warning signs (an aura). An aura may include: ? Seeing flashing lights or having blind spots. ? Seeing bright spots, halos, or zigzag lines. ? Having tunnel vision or blurred vision. ? Having numbness or a tingling feeling. ? Having trouble talking. ? Having weak muscles.  Some people have symptoms after a migraine headache (postdromal phase), such as: ? Tiredness. ? Trouble thinking (concentrating). How is this treated?  Taking medicines that: ? Relieve pain. ? Relieve the feeling of being sick to your stomach. ? Prevent migraine headaches.  Treatment may also include: ? Having acupuncture. ? Avoiding foods that bring on migraine headaches. ? Learning ways to control your body functions (biofeedback). ? Therapy to help you know and deal with negative thoughts (cognitive behavioral therapy). Follow these instructions at home: Medicines  Take over-the-counter and prescription medicines only as told by your doctor.  Ask your doctor if the medicine prescribed to you: ? Requires you to avoid driving or using heavy machinery. ? Can cause trouble pooping (constipation). You may need to take these steps to prevent or treat trouble pooping:  Drink enough fluid to keep your pee (urine) pale yellow.  Take over-the-counter or prescription medicines.  Eat foods that are high in fiber. These include beans, whole grains, and fresh fruits and vegetables.  Limit foods that are high in fat and sugar. These  include fried or sweet foods. Lifestyle  Do not drink alcohol.  Do not use any products that contain nicotine or tobacco, such as cigarettes, e-cigarettes, and chewing tobacco. If you need help quitting, ask your doctor.  Get at least 8 hours of sleep every night.  Limit and deal with stress. General instructions  Keep a journal to  find out what may bring on your migraine headaches. For example, write down: ? What you eat and drink. ? How much sleep you get. ? Any change in what you eat or drink. ? Any change in your medicines.  If you have a migraine headache: ? Avoid things that make your symptoms worse, such as bright lights. ? It may help to lie down in a dark, quiet room. ? Do not drive or use heavy machinery. ? Ask your doctor what activities are safe for you.  Keep all follow-up visits as told by your doctor. This is important.      Contact a doctor if:  You get a migraine headache that is different or worse than others you have had.  You have more than 15 headache days in one month. Get help right away if:  Your migraine headache gets very bad.  Your migraine headache lasts longer than 72 hours.  You have a fever.  You have a stiff neck.  You have trouble seeing.  Your muscles feel weak or like you cannot control them.  You start to lose your balance a lot.  You start to have trouble walking.  You pass out (faint).  You have a seizure. Summary  A migraine headache is a very strong throbbing pain on one side or both sides of your head. These headaches can also cause other symptoms.  This condition may be treated with medicines and changes to your lifestyle.  Keep a journal to find out what may bring on your migraine headaches.  Contact a doctor if you get a migraine headache that is different or worse than others you have had.  Contact your doctor if you have more than 15 headache days in a month. This information is not intended to replace advice given to you by your health care provider. Make sure you discuss any questions you have with your health care provider. Document Revised: 12/27/2018 Document Reviewed: 10/17/2018 Elsevier Patient Education  [place].

## 2020-11-12 ENCOUNTER — Ambulatory Visit: Payer: BC Managed Care – PPO | Admitting: Family Medicine

## 2020-11-12 ENCOUNTER — Encounter: Payer: Self-pay | Admitting: Family Medicine

## 2020-11-12 ENCOUNTER — Other Ambulatory Visit: Payer: Self-pay

## 2020-11-12 VITALS — BP 118/72 | HR 94 | Temp 98.2°F | Resp 16 | Ht 67.0 in | Wt 207.9 lb

## 2020-11-12 DIAGNOSIS — Z6832 Body mass index (BMI) 32.0-32.9, adult: Secondary | ICD-10-CM | POA: Diagnosis not present

## 2020-11-12 DIAGNOSIS — R7303 Prediabetes: Secondary | ICD-10-CM

## 2020-11-12 DIAGNOSIS — E669 Obesity, unspecified: Secondary | ICD-10-CM

## 2020-11-12 DIAGNOSIS — R899 Unspecified abnormal finding in specimens from other organs, systems and tissues: Secondary | ICD-10-CM | POA: Diagnosis not present

## 2020-11-12 NOTE — Progress Notes (Signed)
Name: Brittney Johns   MRN: [id number]    DOB: 16-Jun-1971   Date:11/12/2020       Progress Note  Chief Complaint  Patient presents with  . discuss lab results    From gyno     Subjective:   Brittney Johns is a 50 y.o. female, presents to clinic for f/up on labs that were recently done with her OBGYN  New prediabetes: I reviewed the chart and patient has had a diagnosis of impaired fasting glucose and elevated A1c since 2017 about 5 years ago Her last several years her A1c was normal  Lab Results  Component Value Date   HGBA1C 5.8 (H) 10/07/2020   HGBA1C 5.6 10/08/2019   HGBA1C 5.3 04/30/2018   Family hx -she has 1 brother with diabetes but he works for the [employer] and its from lifestyle no other family members with diabetes  She was told her cholesterol was much worse than prior labs we reviewed her last several years worth of labs her total cholesterol has actually decreased in the past 2 years HDL is optimal and LDL is also in a normal range with a low ASCVD risk score and no family history of early heart attack or stroke Lab Results  Component Value Date   CHOL 181 10/07/2020   HDL 78 10/07/2020   LDLCALC 89 10/07/2020   TRIG 79 10/07/2020   CHOLHDL 2.3 10/07/2020   The 10-year ASCVD risk score Brittney George DC Jr., et al., 2013) is: 0.9%   Values used to calculate the score:     Age: 50 years     Sex: Female     Is Non-Hispanic African American: Yes     Diabetic: No     Tobacco smoker: No     Systolic Blood Pressure: 118 mmHg     Is BP treated: No     HDL Cholesterol: 78 mg/dL     Total Cholesterol: 181 mg/dL  Patient weight has gone up mostly with a change from 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 -in the past she weighed about 1 89-1 90 and now she weighs 207 however 1 year ago she weighed essentially the same and her A1c was normal at that time She does drink a protein shake for breakfast often has a lean cuisine or a salad for lunch and she cooks meals  at home every evening usually has some baked or grilled chicken or other lean meats she does state that she has a lot of snacks and desserts in the evening that she could cut back on She does like to exercise but she is recently had a problem with her right foot that has been preventing her from walking and exercising as much as she would like to Wt Readings from Last 10 Encounters:  11/12/20 207 lb 14.4 oz (94.3 kg)  10/28/20 204 lb (92.5 kg)  10/06/20 206 lb (93.4 kg)  01/22/20 198 lb (89.8 kg)  09/30/19 206 lb 3.2 oz (93.5 kg)  04/28/19 197 lb (89.4 kg)  11/04/18 187 lb 14.4 oz (85.2 kg)  09/20/18 191 lb 3.2 oz (86.7 kg)  09/09/18 190 lb 8 oz (86.4 kg)  08/07/18 189 lb 1.6 oz (85.8 kg)   BMI Readings from Last 5 Encounters:  11/12/20 32.56 kg/m  10/28/20 31.95 kg/m  10/06/20 32.26 kg/m  01/22/20 31.01 kg/m  09/30/19 31.82 kg/m      All labs reviewed with patient today we pulled it up on the computer  in the exam room and reviewed going back several years  Current Outpatient Medications:  .  estradiol (ESTRACE) 1 MG tablet, TAKE 1 AND 1/2 TABLETS(1.5 MG) BY MOUTH DAILY, Disp: 135 tablet, Rfl: 1 .  fluticasone (FLONASE) 50 MCG/ACT nasal spray, Place 2 sprays into both nostrils daily., Disp: 16 g, Rfl: 6 .  gabapentin (NEURONTIN) 100 MG capsule, Take 1 capsule (100 mg total) by mouth 3 (three) times daily., Disp: 270 capsule, Rfl: 3 .  imipramine (TOFRANIL) 25 MG tablet, Take 25 mg by mouth daily. , Disp: , Rfl:  .  Multiple Vitamins-Minerals (MULTIVITAMIN WITH MINERALS) tablet, Take 1 tablet by mouth daily., Disp: , Rfl:  .  PRESCRIPTION MEDICATION, Nasal rinse that she uses BID, Disp: , Rfl:  .  Rimegepant Sulfate (NURTEC) 75 MG TBDP, Take 75 mg by mouth daily as needed (take for abortive therapy of migraine, no more than 1 tablet in 24 hours or 10 per month)., Disp: 10 tablet, Rfl: 11 .  topiramate (TOPAMAX) 50 MG tablet, TAKE 1 TABLET BY MOUTH DAILY., Disp: 90 tablet, Rfl:  3 .  traMADol (ULTRAM) 50 MG tablet, Take 1 tablet (50 mg total) by mouth every 8 (eight) hours as needed., Disp: 30 tablet, Rfl: 0 .  verapamil (CALAN) 80 MG tablet, TAKE 1 TABLET (80 MG TOTAL) BY MOUTH 2 (TWO) TIMES DAILY., Disp: 180 tablet, Rfl: 3  Patient Active Problem List   Diagnosis Date Noted  . Screening for colon cancer   . Family history of colon cancer requiring screening colonoscopy   . Status post vaginal hysterectomy 04/11/2016  . Anxiety 04/11/2016  . Allergic rhinitis 12/31/2015  . Dark stools 12/23/2015  . Constipation 12/23/2015  . Impaired fasting glucose 12/23/2015  . Overweight (BMI 25.0-29.9) 07/21/2015  . Surgical menopause 04/07/2015  . Chronic right hip pain 04/05/2015  . Endometriosis   . Fatigue   . Allergy   . IBS (irritable bowel syndrome)   . Migraine without aura 10/17/2013  . Headache 10/17/2013  . FOM (frequency of micturition) 04/24/2013  . Incomplete bladder emptying 04/24/2013  . Chronic interstitial cystitis 04/24/2013    Past Surgical History:  Procedure Laterality Date  . ABDOMINAL HYSTERECTOMY     complete.  Marland Kitchen BILATERAL OOPHORECTOMY  June 2015  . COLONOSCOPY WITH PROPOFOL N/A 04/29/2018   Procedure: COLONOSCOPY WITH PROPOFOL;  Surgeon: Toney Reil, MD;  Location: [facility] ENDOSCOPY;  Service: Gastroenterology;  Laterality: N/A;  . facial surgey     open bite wound  . FOOT SURGERY Right   . GANGLION CYST EXCISION    . TUBAL LIGATION      Family History  Problem Relation Age of Onset  . Aneurysm Father   . Dementia Mother   . Hypertension Mother   . Kidney disease Mother   . Arthritis Mother   . Renal Disease Other   . Colon cancer Brother   . Migraines Son   . Hypertension Sister   . Diabetes Sister   . Seizures Maternal Aunt   . Cancer Maternal Aunt        brain  . Stroke Maternal Grandmother   . COPD Brother   . Vision loss Brother   . Asthma Brother   . Hypertension Brother   . Diabetes Brother   . Breast  cancer Neg Hx     Social History   Tobacco Use  . Smoking status: Never Smoker  . Smokeless tobacco: Never Used  Vaping Use  . Vaping Use: Never  used  Substance Use Topics  . Alcohol use: No    Alcohol/week: 0.0 standard drinks  . Drug use: No     Allergies  Allergen Reactions  . Imitrex [Sumatriptan] Anaphylaxis    Health Maintenance  Topic Date Due  . MAMMOGRAM  10/15/2021  . COLONOSCOPY (Pts 45-95yrs Insurance coverage will need to be confirmed)  04/30/2023  . TETANUS/TDAP  11/14/2026  . INFLUENZA VACCINE  Completed  . COVID-19 Vaccine  Completed  . Hepatitis C Screening  Completed  . HIV Screening  Completed  . PAP SMEAR-Modifier  Discontinued    Chart Review Today: I personally reviewed active problem list, medication list, allergies, family history, social history, health maintenance, notes from last encounter, lab results, imaging with the patient/caregiver today.   Review of Systems  Constitutional: Negative.   HENT: Negative.   Eyes: Negative.   Respiratory: Negative.   Cardiovascular: Negative.   Gastrointestinal: Negative.   Endocrine: Negative.   Genitourinary: Negative.   Musculoskeletal: Negative.   Skin: Negative.   Allergic/Immunologic: Negative.   Neurological: Negative.   Hematological: Negative.   Psychiatric/Behavioral: Negative.   All other systems reviewed and are negative.    Objective:   Vitals:   11/12/20 1448  BP: 118/72  Pulse: 94  Resp: 16  Temp: 98.2 F (36.8 C)  SpO2: 99%  Weight: 207 lb 14.4 oz (94.3 kg)  Height: 5\' 7"  (1.702 m)    Body mass index is 32.56 kg/m.  Physical Exam Vitals and nursing note reviewed.  Constitutional:      General: She is not in acute distress.    Appearance: Normal appearance. She is well-developed and well-groomed. She is obese. She is not ill-appearing, toxic-appearing or diaphoretic.     Interventions: Face mask in place.  HENT:     Head: Normocephalic and atraumatic.     Right  Ear: External ear normal.     Left Ear: External ear normal.  Eyes:     General: Lids are normal. No scleral icterus.       Right eye: No discharge.        Left eye: No discharge.     Conjunctiva/sclera: Conjunctivae normal.  Neck:     Trachea: Phonation normal. No tracheal deviation.  Cardiovascular:     Rate and Rhythm: Normal rate and regular rhythm.     Pulses: Normal pulses.          Radial pulses are 2+ on the right side and 2+ on the left side.       Posterior tibial pulses are 2+ on the right side and 2+ on the left side.     Heart sounds: Normal heart sounds. No murmur heard. No friction rub. No gallop.   Pulmonary:     Effort: Pulmonary effort is normal. No respiratory distress.     Breath sounds: Normal breath sounds. No stridor. No wheezing, rhonchi or rales.  Chest:     Chest wall: No tenderness.  Abdominal:     General: Bowel sounds are normal. There is no distension.     Palpations: Abdomen is soft.  Musculoskeletal:     Right lower leg: No edema.     Left lower leg: No edema.  Skin:    General: Skin is warm and dry.     Coloration: Skin is not jaundiced or pale.     Findings: No rash.  Neurological:     Mental Status: She is alert.     Motor: No abnormal muscle  tone.     Gait: Gait normal.  Psychiatric:        Mood and Affect: Mood normal.        Speech: Speech normal.        Behavior: Behavior normal. Behavior is cooperative.         Assessment & Plan:     ICD-10-CM   1. Prediabetes  R73.03 Amb Ref to Medical Weight Management    COMPLETE METABOLIC PANEL WITH GFR    Hemoglobin A1c    Insulin, Free (Bioactive)   Prediabetes since about 2017 -last A1c was 5.8 it did increase over the past couple years, encouraged her to cut out sweets and start to observe calorie intake  2. Class 1 obesity with body mass index (BMI) of 32.0 to 32.9 in adult, unspecified obesity type, unspecified whether serious comorbidity present  E66.9 Amb Ref to Medical Weight  Management   Z68.32 COMPLETE METABOLIC PANEL WITH GFR    CBC with Differential/Platelet    Hemoglobin A1c    TSH    Insulin, Free (Bioactive)   Weight fairly stable for the last year but of increase in weight over the past 2+ years likely due to Covid pandemic and working from home  3. Abnormal laboratory test  R89.9    Reviewed her recent labs extensively today with the patient   Plan will be for the patient to continue her efforts with healthy diet at home I suggested she keep track of a few days a week of calorie intake and see if she can substitute high-calorie/low nutrient foods for other types of low-fat or low sugar snacks or decrease the amount of desserts she eats otherwise she has a fairly healthy diet currently.  She does have a problem in her right foot that is limiting her current ability to exercise she is hoping this will improve She is interested in talking to a dietitian -referrals put in to see if we can get this approved  Labs ordered for a few months from now encouraged her to work on healthy diet lifestyle change-can do labs in about 3 to 4 months and follow-up in clinic  Return for f/up in 4-6 months for recheck of A1C/labs.   Danelle BerryLeisa Adom Schoeneck, PA-C 11/12/20 2:54 PM

## 2020-11-12 NOTE — Patient Instructions (Signed)
Diabetes Care, 44(Suppl 1), S34-S39. https://doi.org/https://doi.org/10.2337/dc21-S003">  Prediabetes Prediabetes is when your blood sugar (blood glucose) level is higher than normal but not high enough for you to be diagnosed with type 2 diabetes. Having prediabetes puts you at risk for developing type 2 diabetes (type 2 diabetes mellitus). With certain lifestyle changes, you may be able to prevent or delay the onset of type 2 diabetes. This is important because type 2 diabetes can lead to serious complications, such as:  Heart disease.  Stroke.  Blindness.  Kidney disease.  Depression.  Poor circulation in the feet and legs. In severe cases, this could lead to surgical removal of a leg (amputation). What are the causes? The exact cause of prediabetes is not known. It may result from insulin resistance. Insulin resistance develops when cells in the body do not respond properly to insulin that the body makes. This can cause excess glucose to build up in the blood. High blood glucose (hyperglycemia) can develop. What increases the risk? The following factors may make you more likely to develop this condition:  You have a family member with type 2 diabetes.  You are older than 45 years.  You had a temporary form of diabetes during a pregnancy (gestational diabetes).  You had polycystic ovary syndrome (PCOS).  You are overweight or obese.  You are inactive (sedentary).  You have a history of heart disease, including problems with cholesterol levels, high levels of blood fats, or high blood pressure. What are the signs or symptoms? You may have no symptoms. If you do have symptoms, they may include:  Increased hunger.  Increased thirst.  Increased urination.  Vision changes, such as blurry vision.  Tiredness (fatigue). How is this diagnosed? This condition can be diagnosed with blood tests. Your blood glucose may be checked with one or more of the following tests:  A  fasting blood glucose (FBG) test. You will not be allowed to eat (you will fast) for at least 8 hours before a blood sample is taken.  An A1C blood test (hemoglobin A1C). This test provides information about blood glucose levels over the previous 2?3 months.  An oral glucose tolerance test (OGTT). This test measures your blood glucose at two points in time: ? After fasting. This is your baseline level. ? Two hours after you drink a beverage that contains glucose. You may be diagnosed with prediabetes if:  Your FBG is 100?125 mg/dL (5.6-6.9 mmol/L).  Your A1C level is 5.7?6.4% (39-46 mmol/mol).  Your OGTT result is 140?199 mg/dL (7.8-11 mmol/L). These blood tests may be repeated to confirm your diagnosis.   How is this treated? Treatment may include dietary and lifestyle changes to help lower your blood glucose and prevent type 2 diabetes from developing. In some cases, medicine may be prescribed to help lower the risk of type 2 diabetes. Follow these instructions at home: Nutrition  Follow a healthy meal plan. This includes eating lean proteins, whole grains, legumes, fresh fruits and vegetables, low-fat dairy products, and healthy fats.  Follow instructions from your health care provider about eating or drinking restrictions.  Meet with a dietitian to create a healthy eating plan that is right for you.   Lifestyle  Do moderate-intensity exercise for at least 30 minutes a day on 5 or more days each week, or as told by your health care provider. A mix of activities may be best, such as: ? Brisk walking, swimming, biking, and weight lifting.  Lose weight as told by your health   care provider. Losing 5-7% of your body weight can reverse insulin resistance.  Do not drink alcohol if: ? Your health care provider tells you not to drink. ? You are pregnant, may be pregnant, or are planning to become pregnant.  If you drink alcohol: ? Limit how much you use to:  0-1 drink a day for  women.  0-2 drinks a day for men. ? Be aware of how much alcohol is in your drink. In the [place] one drink equals one 12 oz bottle of beer (355 mL), one 5 oz glass of wine (148 mL), or one 1 oz glass of hard liquor (44 mL). General instructions  Take over-the-counter and prescription medicines only as told by your health care provider. You may be prescribed medicines that help lower the risk of type 2 diabetes.  Do not use any products that contain nicotine or tobacco, such as cigarettes, e-cigarettes, and chewing tobacco. If you need help quitting, ask your health care provider.  Keep all follow-up visits. This is important. Where to find more information  American Diabetes Association: www.diabetes.org  Academy of Nutrition and Dietetics: www.eatright.org  American Heart Association: www.heart.org Contact a health care provider if:  You have any of these symptoms: ? Increased hunger. ? Increased urination. ? Increased thirst. ? Fatigue. ? Vision changes, such as blurry vision. Get help right away if you:  Have shortness of breath.  Feel confused.  Vomit or feel like you may vomit. Summary  Prediabetes is when your blood sugar (blood glucose)level is higher than normal but not high enough for you to be diagnosed with type 2 diabetes.  Having prediabetes puts you at risk for developing type 2 diabetes (type 2 diabetes mellitus).  Make lifestyle changes such as eating a healthy diet and exercising regularly to help prevent diabetes. Lose weight as told by your health care provider. This information is not intended to replace advice given to you by your health care provider. Make sure you discuss any questions you have with your health care provider. Document Revised: 12/04/2019 Document Reviewed: 12/04/2019 Elsevier Patient Education  2021 Elsevier Inc.  

## 2020-11-24 ENCOUNTER — Ambulatory Visit: Payer: BC Managed Care – PPO | Admitting: Podiatry

## 2020-11-29 ENCOUNTER — Ambulatory Visit: Payer: BC Managed Care – PPO | Admitting: Podiatry

## 2020-11-29 ENCOUNTER — Encounter: Payer: Self-pay | Admitting: *Deleted

## 2020-11-29 ENCOUNTER — Other Ambulatory Visit: Payer: Self-pay

## 2020-11-29 ENCOUNTER — Encounter: Payer: Self-pay | Admitting: Podiatry

## 2020-11-29 DIAGNOSIS — M778 Other enthesopathies, not elsewhere classified: Secondary | ICD-10-CM

## 2020-11-29 MED ORDER — DEXAMETHASONE SODIUM PHOSPHATE 120 MG/30ML IJ SOLN
2.0000 mg | Freq: Once | INTRAMUSCULAR | Status: AC
  Administered 2020-11-29: 2 mg via INTRA_ARTICULAR

## 2020-11-29 NOTE — Progress Notes (Signed)
Patient presents today for follow-up of her capsulitis of her right foot.  States that is hurting under the second toe once again.  States that she bought a new pair of [employer] tennis shoes and was using her orthotics and was doing well for a few days and the foot started to become painful once again.  She states that I am ready to do anything that I have to do to make his pain go away and stay away for good.  Objective: Vital signs are stable she is alert oriented x3 there is no erythema edema cellulitis drainage or odor there is no medial deviation of the toe on range of motion.  Passively or actively.  The joint appears to be intact as the skin puckers with the capsule as the toe was distracted.  She has good turgor of the skin and there is no nodularity beneath the metatarsophalangeal joint.  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  Plan: At this point we performed an arthrocentesis also injected 2 mg of dexamethasone directly into the joint further follow-up with her in 1 month if not improved MRI and then surgical correction.

## 2020-12-01 ENCOUNTER — Other Ambulatory Visit: Payer: BC Managed Care – PPO

## 2021-01-05 ENCOUNTER — Ambulatory Visit (INDEPENDENT_AMBULATORY_CARE_PROVIDER_SITE_OTHER): Payer: BC Managed Care – PPO

## 2021-01-05 ENCOUNTER — Encounter: Payer: BC Managed Care – PPO | Attending: Family Medicine | Admitting: Dietician

## 2021-01-05 ENCOUNTER — Encounter: Payer: Self-pay | Admitting: Dietician

## 2021-01-05 ENCOUNTER — Other Ambulatory Visit: Payer: Self-pay

## 2021-01-05 ENCOUNTER — Encounter: Payer: Self-pay | Admitting: Podiatry

## 2021-01-05 ENCOUNTER — Ambulatory Visit (INDEPENDENT_AMBULATORY_CARE_PROVIDER_SITE_OTHER): Payer: BC Managed Care – PPO | Admitting: Podiatry

## 2021-01-05 VITALS — Ht 67.0 in | Wt 207.0 lb

## 2021-01-05 DIAGNOSIS — M778 Other enthesopathies, not elsewhere classified: Secondary | ICD-10-CM

## 2021-01-05 DIAGNOSIS — Z6832 Body mass index (BMI) 32.0-32.9, adult: Secondary | ICD-10-CM | POA: Diagnosis not present

## 2021-01-05 DIAGNOSIS — R7303 Prediabetes: Secondary | ICD-10-CM

## 2021-01-05 DIAGNOSIS — E669 Obesity, unspecified: Secondary | ICD-10-CM | POA: Diagnosis not present

## 2021-01-05 DIAGNOSIS — M216X1 Other acquired deformities of right foot: Secondary | ICD-10-CM

## 2021-01-05 NOTE — Progress Notes (Signed)
Medical Nutrition Therapy: Visit start time: 0815  end time: 0920  Assessment:  Diagnosis: pre-diabetes, obesity Past medical history: IBS, interstitial cystitis, migraines Psychosocial issues/ stress concerns: none  Preferred learning method:  . Auditory- discussion . Visual . Hands-on  Current weight: 207lbs Height: 5'7" BMI: 32.42 Medications, supplements: reconciled list in medical record  Progress and evaluation:   Patient reports weight gain in recent months, estimates 5lbs since 09/2020.   No recent diet or lifestyle changes   Had bunion surgery over 1 year ago and has had pain due to encapsulitis since then, which has decreased physical activity. Was walking 3-4 times weekly  She has tried herbalife, saxenda for weight loss with limited success only.   Has 2 brothers with Type 2 diabetes  Physical activity: none  Dietary Intake:  Usual eating pattern includes 2-3 meals and 1-2 snacks per day. Dining out frequency: 2-4 meals per week.  Breakfast: was drinking shake homemade, then Premier or none;  Snack: almonds; crackers ie cheez-its Lunch: cereal ie special k or cheerios with almond milk Snack: none Supper: was eating fast foods 3-4 times a week ie chicken sandwich, some breaded foods Snack: ice cream/ something sweet ie chocolate candy Beverages: water, some with sugar free flavoring; occasional sweet tea  Nutrition Care Education: Topics covered:  Basic nutrition: basic food groups; appropriate nutrient balance and basic meal planning using plate method; appropriate meal and snack schedule; general nutrition guidelines  Weight control: importance of low sugar and low fat choices; portion control strategies; options for simple homemade meals Diabetes prevention: Type 1, Type 2, and pre-diabetes definitions; HbA1C test;  appropriate meal and snack schedule, appropriate carb intake and balance, healthy carb choices, role of fiber, protein; role of physical  activity Other:  Pro- and pre-biotic foods and supplements for IBS per patient question  Nutritional Diagnosis:  [place]-2.2 Altered nutrition-related laboratory As related to pre-diabetes.  As evidenced by patient with recent HbA1C of 5.8%. [place]-3.3 Overweight/obesity As related to excess calories and inadequate physical activity.  As evidenced by patient with current BMI of 32.4.  Intervention:  . Instruction and discussion as noted above. . Patient voices readiness to work on lifestyle changes to promote weight loss and prevent Type 2 diabetes. . Established goals for change with direction from patient.  Education Materials given:  . General diet guidelines for Diabetes (prevention) . Plate Planner with food lists, sample meal pattern . Sample menus . Visit summary with goals/ instructions   Learner/ who was taught:  . Patient   Level of understanding: Marland Kitchen Verbalizes/ demonstrates competency   Demonstrated degree of understanding via:   Teach back Learning barriers: . None  Willingness to learn/ readiness for change: . Acceptance, ready for change   Monitoring and Evaluation:  Dietary intake, exercise, BG control, and body weight      follow up: 02/24/21 at 5:00pm

## 2021-01-05 NOTE — Patient Instructions (Signed)
   Plan to eat more meals at home; use simple menus for ideas.  Limit sweets in evenings by gradually reducing portions, choosing lower sugar options.   Control food portions -- keep starchy foods to 1 cup (fist size) or less. Increase portions of low-carb veggies.

## 2021-01-05 NOTE — Progress Notes (Signed)
She presents today for follow-up of capsulitis second metatarsophalangeal joint of her right foot.  She states that it is "horrible".  I reviewed testing surgically fixed.  She denies any changes in her past medical history medications allergies surgeries social history.  Objective: Vital signs stable oriented x3.  Pulses are palpable.  She has pain on end range of motion and on palpation of the second metatarsophalangeal joint of the left right foot she has very little in the way inflammation at this point and no medial deviation of the toe as of yet.  Radiographs taken today demonstrate an osseously mature individual with screws to the first and fifth metatarsals.  No fractures no dislocations at the second metatarsophalangeal joint.  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  Plan: Discussed etiology pathology conservative surgical therapies at this point time went ahead and performed a surgical consult regarding a second met osteotomy demonstrate fixation and possible pinning of the toe she understands this is amenable to would like to have this done soon as possible she understands possible postop complications with which we discussed thoroughly today in office.  She has a cam walker and she will bring it with her on the day of surgery.

## 2021-01-13 ENCOUNTER — Telehealth: Payer: Self-pay | Admitting: Urology

## 2021-01-13 NOTE — Telephone Encounter (Signed)
DOS - 01/21/21  METATARSAL OSTEOTOMY 2ND RIGHT --- 28308  [employer] EFFECTIVE DATE - 09/18/20  PLAN DEDUCTIBLE - $1,250.00 W/ $1,250.00 REMAINING OUT OF POCKET - $4,890.00 W/ $4,521.09 REMAINING COINSURANCE - 20% COPAY -  $0.00   NO PRIOR AUTH REQUIRED

## 2021-01-19 ENCOUNTER — Other Ambulatory Visit: Payer: Self-pay | Admitting: Podiatry

## 2021-01-19 DIAGNOSIS — M79676 Pain in unspecified toe(s): Secondary | ICD-10-CM

## 2021-01-19 MED ORDER — ONDANSETRON HCL 4 MG PO TABS: 4.0000 mg | ORAL_TABLET | Freq: Three times a day (TID) | ORAL | 0 refills | Status: DC | PRN

## 2021-01-19 MED ORDER — OXYCODONE-ACETAMINOPHEN 10-325 MG PO TABS: 1.0000 | ORAL_TABLET | Freq: Three times a day (TID) | ORAL | 0 refills | Status: AC | PRN

## 2021-01-19 MED ORDER — CEPHALEXIN 500 MG PO CAPS: 500.0000 mg | ORAL_CAPSULE | Freq: Three times a day (TID) | ORAL | 0 refills | Status: DC

## 2021-01-21 DIAGNOSIS — M21541 Acquired clubfoot, right foot: Secondary | ICD-10-CM | POA: Diagnosis not present

## 2021-01-23 ENCOUNTER — Telehealth: Payer: Self-pay | Admitting: Sports Medicine

## 2021-01-23 NOTE — Telephone Encounter (Signed)
Patient's husband called and reporting that his wife had surgery and dressing is too tight. I instructed husband on how to loosen up the guaze at the toes. He states that toes are normal pink color. I also advised husband to make sure wife is resting, icing, elevating, and taking her post op meds and even an anti-inflammatory medication to also help with pain and swelling. -Dr. Marylene Land

## 2021-01-25 ENCOUNTER — Encounter: Payer: Self-pay | Admitting: Family Medicine

## 2021-01-26 ENCOUNTER — Ambulatory Visit (INDEPENDENT_AMBULATORY_CARE_PROVIDER_SITE_OTHER): Payer: BC Managed Care – PPO

## 2021-01-26 ENCOUNTER — Encounter: Payer: Self-pay | Admitting: Podiatry

## 2021-01-26 ENCOUNTER — Telehealth: Payer: Self-pay

## 2021-01-26 ENCOUNTER — Ambulatory Visit (INDEPENDENT_AMBULATORY_CARE_PROVIDER_SITE_OTHER): Payer: BC Managed Care – PPO | Admitting: Podiatry

## 2021-01-26 ENCOUNTER — Other Ambulatory Visit: Payer: Self-pay

## 2021-01-26 VITALS — Temp 98.9°F

## 2021-01-26 DIAGNOSIS — M778 Other enthesopathies, not elsewhere classified: Secondary | ICD-10-CM

## 2021-01-26 DIAGNOSIS — M216X1 Other acquired deformities of right foot: Secondary | ICD-10-CM

## 2021-01-26 DIAGNOSIS — Z9889 Other specified postprocedural states: Secondary | ICD-10-CM

## 2021-01-26 MED ORDER — FLUCONAZOLE 150 MG PO TABS: 150.0000 mg | ORAL_TABLET | Freq: Once | ORAL | 0 refills | Status: AC

## 2021-01-26 NOTE — Telephone Encounter (Signed)
Called pt to get her scheduled for a virtual today. Pt states she has an appt with Dr Al Corpus today and will ask for anibiotics

## 2021-01-30 NOTE — Progress Notes (Signed)
She presents today for her postop visit she is status post second metatarsal osteotomy date of surgery is Jan 21, 2021 states that is doing okay other than the bandage was feeling bit tired of experiencing normal swelling nothing abnormal I do not think I have a yeast infection from the antibiotic and wants to know if Dr.-Call something in.  Objective: Vital signs stable alert oriented x3 pulses are palpable.  Rest of dressing intact was removed demonstrates no erythema to some mild edema no cellulitis drainage or odor incision site appears to be healing very nicely toe sitting rectus and has good range of motion.  Radiographs taken today demonstrate internal fixation in good position as is the osteotomy site.  Assessment: Well-healing surgical foot.  Plan: I did send over Diflucan for her yeast infection and I also redressed her foot today dressed a compressive dressing she will continue to wear cam walker at all times follow-up with me in 1 week for suture removal.

## 2021-02-02 ENCOUNTER — Ambulatory Visit (INDEPENDENT_AMBULATORY_CARE_PROVIDER_SITE_OTHER): Payer: BC Managed Care – PPO | Admitting: Podiatry

## 2021-02-02 ENCOUNTER — Encounter: Payer: Self-pay | Admitting: Podiatry

## 2021-02-02 ENCOUNTER — Other Ambulatory Visit: Payer: Self-pay

## 2021-02-02 DIAGNOSIS — M216X1 Other acquired deformities of right foot: Secondary | ICD-10-CM

## 2021-02-02 DIAGNOSIS — Z9889 Other specified postprocedural states: Secondary | ICD-10-CM

## 2021-02-02 MED ORDER — FLUCONAZOLE 150 MG PO TABS: 150.0000 mg | ORAL_TABLET | Freq: Once | ORAL | 2 refills | Status: AC

## 2021-02-02 NOTE — Progress Notes (Signed)
She presents today date of surgery 01/21/2021 metatarsal osteotomy second right foot.  States that is doing good I still feel the lump and the tenderness underneath the area.  She says that all in all is doing better.  Objective: Vital signs are stable alert oriented x3 dressed her dressing intact once removed reveals toe sitting rectus she also has 6 sutures are still intact and there is mild edema.  No erythema cellulitis drainage or odor.  Assessment: Well-healing surgical foot.  Plan: Sutures removed today put her in a Darco digital splint demonstrated this to her also requested that she continue physical range of motion's at home.  Placed her in a Darco shoe and I like to follow-up with her in about 2 weeks for another set of x-rays.

## 2021-02-16 ENCOUNTER — Encounter: Payer: Self-pay | Admitting: Podiatry

## 2021-02-16 ENCOUNTER — Other Ambulatory Visit: Payer: Self-pay

## 2021-02-16 ENCOUNTER — Ambulatory Visit (INDEPENDENT_AMBULATORY_CARE_PROVIDER_SITE_OTHER): Payer: BC Managed Care – PPO

## 2021-02-16 ENCOUNTER — Ambulatory Visit (INDEPENDENT_AMBULATORY_CARE_PROVIDER_SITE_OTHER): Payer: BC Managed Care – PPO | Admitting: Podiatry

## 2021-02-16 DIAGNOSIS — M2041 Other hammer toe(s) (acquired), right foot: Secondary | ICD-10-CM

## 2021-02-16 DIAGNOSIS — M216X1 Other acquired deformities of right foot: Secondary | ICD-10-CM

## 2021-02-16 DIAGNOSIS — Z9889 Other specified postprocedural states: Secondary | ICD-10-CM

## 2021-02-16 NOTE — Progress Notes (Signed)
She presents today for her third postop visit date of surgery 01/21/2021 metatarsal osteotomy second.  States that she continues to do her toe exercises and wear her Darco splint a bit.  States that is been a little better each day but it [place] too long it still becomes painful.  Objective: Vital signs are stable she is alert oriented x3 there is no erythema to some mild edema no cellulitis drainage or odor incision site appears to be healing very nicely she has good strong plantar flexion to the toe itself.  Radiographs taken today demonstrate a well-healing osteotomy with internal fixation intact.  There is to be good compression across the osteotomy site.  Assessment: Well-healing surgical foot right.  Plan: Discussed etiology pathology and surgical therapies this point time went ahead recommended continue physical therapy and the use of her Darco shoe.  She will be going back to work around the 12th and I will follow-up with her around that time for another set of x-rays.

## 2021-02-21 ENCOUNTER — Encounter: Payer: Self-pay | Admitting: Podiatry

## 2021-02-23 DIAGNOSIS — M79676 Pain in unspecified toe(s): Secondary | ICD-10-CM

## 2021-02-24 ENCOUNTER — Ambulatory Visit: Payer: BC Managed Care – PPO | Admitting: Dietician

## 2021-03-07 ENCOUNTER — Other Ambulatory Visit: Payer: Self-pay

## 2021-03-07 ENCOUNTER — Ambulatory Visit (INDEPENDENT_AMBULATORY_CARE_PROVIDER_SITE_OTHER): Payer: BC Managed Care – PPO | Admitting: Podiatry

## 2021-03-07 ENCOUNTER — Encounter: Payer: BC Managed Care – PPO | Admitting: Podiatry

## 2021-03-07 ENCOUNTER — Ambulatory Visit (INDEPENDENT_AMBULATORY_CARE_PROVIDER_SITE_OTHER): Payer: BC Managed Care – PPO

## 2021-03-07 DIAGNOSIS — M2041 Other hammer toe(s) (acquired), right foot: Secondary | ICD-10-CM

## 2021-03-07 NOTE — Progress Notes (Signed)
She presents today for a follow-up of her second metatarsal osteotomy.  States that is doing better.  Date of surgery 01/21/2021 right foot.  Objective: Vital signs stable alert oriented x3 there is no erythema some mild edema no cellulitis drainage or odor scar is tight with plantar flexion of the toe.  But otherwise she had good full range of motion of the joint.  Assessment: Well-healing surgical foot.  Plan: Follow-up with me in 1 month we did discuss a silicone scar sheeting we also discussed massage therapy.  If her toe is not loose next visit we will consider physical therapy.

## 2021-03-14 ENCOUNTER — Ambulatory Visit: Payer: BC Managed Care – PPO | Admitting: Family Medicine

## 2021-03-16 ENCOUNTER — Encounter: Payer: BC Managed Care – PPO | Admitting: Podiatry

## 2021-03-18 ENCOUNTER — Ambulatory Visit: Payer: BC Managed Care – PPO | Admitting: Dietician

## 2021-03-22 ENCOUNTER — Ambulatory Visit: Payer: BC Managed Care – PPO | Admitting: Family Medicine

## 2021-03-22 ENCOUNTER — Other Ambulatory Visit: Payer: Self-pay

## 2021-03-22 ENCOUNTER — Encounter: Payer: Self-pay | Admitting: Family Medicine

## 2021-03-22 VITALS — BP 124/72 | HR 89 | Temp 98.4°F | Resp 16 | Ht 67.0 in | Wt 209.9 lb

## 2021-03-22 DIAGNOSIS — R7303 Prediabetes: Secondary | ICD-10-CM | POA: Diagnosis not present

## 2021-03-22 DIAGNOSIS — E6609 Other obesity due to excess calories: Secondary | ICD-10-CM | POA: Diagnosis not present

## 2021-03-22 DIAGNOSIS — Z6832 Body mass index (BMI) 32.0-32.9, adult: Secondary | ICD-10-CM

## 2021-03-22 LAB — POCT GLYCOSYLATED HEMOGLOBIN (HGB A1C): Hemoglobin A1C: 5.6 % (ref 4.0–5.6)

## 2021-03-22 NOTE — Progress Notes (Signed)
    SUBJECTIVE:   CHIEF COMPLAINT / HPI:   Prediabetes - Last A1c 5.8 09/2020 - Medications: none - Compliance: n/a - Diet: 5 small meals per day. At least 3 servings of vegetables per day. Has cut out sweet tea. - Exercise: has foot issues with upcoming surgery, difficult to exercise. - FH: brother with diabetes - referred to weight management at last visit.  - Denies symptoms of hypoglycemia, polyuria, polydipsia, numbness extremities.   OBJECTIVE:   BP 124/72   Pulse 89   Temp 98.4 F (36.9 C) (Oral)   Resp 16   Ht 5\' 7"  (1.702 m)   Wt 209 lb 14.4 oz (95.2 kg)   SpO2 96%   BMI 32.87 kg/m   Gen: well appearing, in NAD Card: reg rate Lungs: no resp distress Ext: WWP  ASSESSMENT/PLAN:   Prediabetes a1c improved, 5.6 today. Will continue to monitor and work on weight loss through diet and exercise. Handout provided. F/u in 6 months.  Class 1 obesity with body mass index (BMI) of 32.0 to 32.9 in adult Contributing to prediabetes. Recommend weight loss through diet and exercise.      , DO

## 2021-03-22 NOTE — Patient Instructions (Addendum)
It was great to see you!  Our plans for today:  - Continue to work on losing weight through diet and exercise (see below for tips). - Get your shingles vaccine from the pharmacy. - Come back in 6 months.  We are checking some labs today, we will release these results to your MyChart.  Take care and seek immediate care sooner if you develop any concerns.   Dr. Ky Barban   Diet Recommendations for Diabetes   1. Eat at least 3 meals and 1-2 snacks per day. Never go more than 4-5 hours while awake without eating. Eat breakfast within the first hour of getting up.   2. Limit starchy foods to TWO per meal and ONE per snack. ONE portion of a starchy  food is equal to the following:   - ONE slice of bread (or its equivalent, such as half of a hamburger bun).   - 1/2 cup of a "scoopable" starchy food such as potatoes or rice.   - 15 grams of Total Carbohydrate as shown on food label.  3. Include at every meal: a protein food, a carb food, and vegetables and/or fruit.   - Obtain twice the volume of vegetables as protein or carbohydrate foods for both lunch and dinner.   - Fresh or frozen vegetables are best.   - Keep frozen vegetables on hand for a quick vegetable serving.       Starchy (carb) foods: Bread, rice, pasta, potatoes, corn, cereal, grits, crackers, bagels, muffins, all baked goods.  (Fruits, milk, and yogurt also have carbohydrate, but most of these foods will not spike your blood sugar as most starchy foods will.)  A few fruits do cause high blood sugars; use small portions of bananas (limit to 1/2 at a time), grapes, watermelon, oranges, and most tropical fruits.    Protein foods: Meat, fish, poultry, eggs, dairy foods, and beans such as pinto and kidney beans (beans also provide carbohydrate).      Look for opportunities to move your body throughout your day:  Never lie down when you can sit; never sit when you can stand; never stand when you can pace.  Moving your body throughout  the day is just as important as the 30 or 60 minutes of exercise at the gym!  Get social Get active with your friends instead of going out to eat. Go for a hike, walk around the mall, or play an exercise-themed video game.   Move more at work Fit more activity into the workday. Stand during phone calls, use a printer farther from your desk, and get up to stretch each hour.    Do something new Develop a new skill to kick-start your motivation. Sign up for a class to learn how to [employer], surf, do tai chi, or play a sport.    Keep cool in the pool Don't like to sweat? Hit the local community pool for a swim, water polo, or water aerobics class to stay cool while exercising.    Stay on track Use a fitness tracker (FITBIT, Fitness Pal mobile app) to track your activity and provide motivation to reach your goals.

## 2021-03-22 NOTE — Assessment & Plan Note (Signed)
Contributing to prediabetes. Recommend weight loss through diet and exercise.

## 2021-03-22 NOTE — Assessment & Plan Note (Signed)
a1c improved, 5.6 today. Will continue to monitor and work on weight loss through diet and exercise. Handout provided. F/u in 6 months.

## 2021-04-06 ENCOUNTER — Other Ambulatory Visit: Payer: Self-pay

## 2021-04-06 ENCOUNTER — Ambulatory Visit: Payer: BC Managed Care – PPO | Admitting: Podiatry

## 2021-04-06 DIAGNOSIS — M722 Plantar fascial fibromatosis: Secondary | ICD-10-CM

## 2021-04-06 MED ORDER — MELOXICAM 15 MG PO TABS: 15.0000 mg | ORAL_TABLET | Freq: Every day | ORAL | 3 refills | Status: DC

## 2021-04-06 NOTE — Patient Instructions (Signed)

## 2021-04-06 NOTE — Progress Notes (Signed)
  Subjective:  Patient ID: Brittney Johns, female    DOB: [birth year],  MRN: [id number]  Chief Complaint  Patient presents with   Brittney Johns     [place] Pt- Right foot pain     50 y.o. female presents with the above complaint. History confirmed with patient.  Had recent right foot surgery with Dr. Al Corpus.  She has a history of plantar fasciitis in the left foot that was treated with a brace and injections and anti-inflammatories previously.  The left side started to return about 6 to 7 days ago and is beginning worse daily.  Objective:  Physical Exam: warm, good capillary refill, no trophic changes or ulcerative lesions, normal DP and PT pulses, and normal sensory exam. Left Foot: point tenderness over the heel pad Assessment:   1. Plantar fasciitis of left foot      Plan:  Patient was evaluated and treated and all questions answered.  Discussed the etiology and treatment options for plantar fasciitis including stretching, formal physical therapy, supportive shoegears such as a running shoe or sneaker, pre fabricated orthoses, injection therapy, and oral medications. We also discussed the role of surgical treatment of this for patients who do not improve after exhausting non-surgical treatment options.   -XR reviewed with patient -Educated patient on stretching and icing of the affected limb -Plantar fascial brace dispensed -Injection delivered to the plantar fascia of the left foot. -Rx for meloxicam. Educated on use, risks and benefits of the medication  After sterile prep with povidone-iodine solution and alcohol, the left heel was injected with 0.5cc 2% xylocaine plain, 0.5cc 0.5% marcaine plain, 5mg  triamcinolone acetonide, and 2mg  dexamethasone was injected along the medial plantar fascia at the insertion on the plantar calcaneus. The patient tolerated the procedure well without complication.  No follow-ups on file.

## 2021-04-18 ENCOUNTER — Encounter: Payer: BC Managed Care – PPO | Admitting: Podiatry

## 2021-04-19 ENCOUNTER — Ambulatory Visit
Admission: RE | Admit: 2021-04-19 | Discharge: 2021-04-19 | Disposition: A | Discharge status: Home / Self Care | Payer: BC Managed Care – PPO | Source: Ambulatory Visit | Attending: Obstetrics and Gynecology | Admitting: Obstetrics and Gynecology

## 2021-04-19 ENCOUNTER — Other Ambulatory Visit: Payer: Self-pay

## 2021-04-19 DIAGNOSIS — Z1231 Encounter for screening mammogram for malignant neoplasm of breast: Secondary | ICD-10-CM | POA: Insufficient documentation

## 2021-04-20 ENCOUNTER — Encounter: Payer: Self-pay | Admitting: Podiatry

## 2021-04-20 ENCOUNTER — Ambulatory Visit (INDEPENDENT_AMBULATORY_CARE_PROVIDER_SITE_OTHER): Payer: BC Managed Care – PPO

## 2021-04-20 ENCOUNTER — Ambulatory Visit (INDEPENDENT_AMBULATORY_CARE_PROVIDER_SITE_OTHER): Payer: BC Managed Care – PPO | Admitting: Podiatry

## 2021-04-20 DIAGNOSIS — M722 Plantar fascial fibromatosis: Secondary | ICD-10-CM

## 2021-04-20 DIAGNOSIS — M2041 Other hammer toe(s) (acquired), right foot: Secondary | ICD-10-CM

## 2021-04-20 DIAGNOSIS — M216X1 Other acquired deformities of right foot: Secondary | ICD-10-CM

## 2021-04-20 DIAGNOSIS — Z9889 Other specified postprocedural states: Secondary | ICD-10-CM

## 2021-04-20 MED ORDER — TRIAMCINOLONE ACETONIDE 40 MG/ML IJ SUSP
20.0000 mg | Freq: Once | INTRAMUSCULAR | Status: AC
  Administered 2021-04-20: 20 mg

## 2021-04-20 NOTE — Progress Notes (Signed)
Presents today postop visit date of surgery 01/21/2021 metatarsal osteotomy second right.  States that her surgical foot has felt fine but the left foot has had a plantar fascial flareup.  Saw Dr. Lilian Kapur put a shot in it and that did well but is still starting to hurt.  Objective: Vital signs stable oriented x3 still splinted palpation MucoClear tubercle of the left heel.  Assessment: Planter fasciitis left.  Plan: Reinjected 20 mg Kenalog 5 mg Marcaine for maximal tenderness left heel.  Follow-up with her as needed

## 2021-05-04 ENCOUNTER — Other Ambulatory Visit: Payer: Self-pay

## 2021-05-04 ENCOUNTER — Ambulatory Visit: Payer: BC Managed Care – PPO | Admitting: Family Medicine

## 2021-05-04 ENCOUNTER — Encounter: Payer: Self-pay | Admitting: Family Medicine

## 2021-05-04 VITALS — BP 102/62 | HR 94 | Temp 98.0°F | Resp 16 | Ht 67.0 in | Wt 214.2 lb

## 2021-05-04 DIAGNOSIS — Z09 Encounter for follow-up examination after completed treatment for conditions other than malignant neoplasm: Secondary | ICD-10-CM | POA: Diagnosis not present

## 2021-05-04 DIAGNOSIS — R590 Localized enlarged lymph nodes: Secondary | ICD-10-CM

## 2021-05-04 DIAGNOSIS — R0789 Other chest pain: Secondary | ICD-10-CM

## 2021-05-04 DIAGNOSIS — R062 Wheezing: Secondary | ICD-10-CM

## 2021-05-04 MED ORDER — ALBUTEROL SULFATE HFA 108 (90 BASE) MCG/ACT IN AERS: 2.0000 | INHALATION_SPRAY | Freq: Four times a day (QID) | RESPIRATORY_TRACT | 1 refills | Status: DC | PRN

## 2021-05-04 NOTE — Progress Notes (Signed)
Patient ID: Brittney Johns, female    DOB: Nov 21, 1970, 50 y.o.   MRN: [id number]  PCP: Doren Custard, FNP  Chief Complaint  Patient presents with   ER folow-up    Chest pains    Subjective:   Brittney Johns is a 50 y.o. female, presents to clinic with CC of the following:  HPI  Presents for f/up on CP and ER visit from 8/14 She presented to [facility] ED for upper anterior bilateral intermittent sharp CP Workup was unremarkable including CXR, ECG, CT angio, labs Dx with chest wall pain and given muscle relaxers She had covid booster shortly before sx developed, had associated body aches, fatigue, muscle spasms and then CP Over the past 2-3 days sx have gradually improved, she still gets pain to upper lateral right and left chest, robaxin doesn't make a difference, overall less severe, less intense and less malaise, myalgias and fatigue She denies wheeze, SOB, palpitations, GERD Hx of bronchitis  Patient Active Problem List   Diagnosis Date Noted   Prediabetes 11/12/2020   Class 1 obesity with body mass index (BMI) of 32.0 to 32.9 in adult 09/20/2018   Family history of colon cancer requiring screening colonoscopy    Status post vaginal hysterectomy 04/11/2016   Anxiety 04/11/2016   Allergic rhinitis 12/31/2015   Constipation 12/23/2015   Surgical menopause 04/07/2015   Chronic right hip pain 04/05/2015   Endometriosis    IBS (irritable bowel syndrome)    Migraine without aura 10/17/2013   Headache 10/17/2013   FOM (frequency of micturition) 04/24/2013   Incomplete bladder emptying 04/24/2013   Chronic interstitial cystitis 04/24/2013      Current Outpatient Medications:    Biotin 5000 MCG CAPS, Take by mouth., Disp: , Rfl:    estradiol (ESTRACE) 1 MG tablet, TAKE 1 AND 1/2 TABLETS(1.5 MG) BY MOUTH DAILY, Disp: 135 tablet, Rfl: 1   fluticasone (FLONASE) 50 MCG/ACT nasal spray, Place 2 sprays into both nostrils daily., Disp: 16 g, Rfl: 6   gabapentin  (NEURONTIN) 100 MG capsule, Take 1 capsule (100 mg total) by mouth 3 (three) times daily., Disp: 270 capsule, Rfl: 3   imipramine (TOFRANIL) 25 MG tablet, Take 25 mg by mouth daily. , Disp: , Rfl:    meloxicam (MOBIC) 15 MG tablet, Take 1 tablet (15 mg total) by mouth daily., Disp: 30 tablet, Rfl: 3   Multiple Vitamins-Minerals (MULTIVITAMIN WITH MINERALS) tablet, Take 1 tablet by mouth daily., Disp: , Rfl:    Rimegepant Sulfate (NURTEC) 75 MG TBDP, Take 75 mg by mouth daily as needed (take for abortive therapy of migraine, no more than 1 tablet in 24 hours or 10 per month)., Disp: 10 tablet, Rfl: 11   topiramate (TOPAMAX) 50 MG tablet, TAKE 1 TABLET BY MOUTH DAILY., Disp: 90 tablet, Rfl: 3   verapamil (CALAN) 80 MG tablet, TAKE 1 TABLET (80 MG TOTAL) BY MOUTH 2 (TWO) TIMES DAILY., Disp: 180 tablet, Rfl: 3   methocarbamol (ROBAXIN) 500 MG tablet, Take 500 mg by mouth 2 (two) times daily., Disp: , Rfl:    Allergies  Allergen Reactions   Imitrex [Sumatriptan] Anaphylaxis     Social History   Tobacco Use   Smoking status: Never   Smokeless tobacco: Never  Vaping Use   Vaping Use: Never used  Substance Use Topics   Alcohol use: No    Alcohol/week: 0.0 standard drinks   Drug use: No      Chart Review Today: I  personally reviewed active problem list, medication list, allergies, family history, social history, health maintenance, notes from last encounter, lab results, imaging with the patient/caregiver today. [facility] ED visit labs, imaging results and documentation all reviewed with pt today in room   Review of Systems  Constitutional: Negative.   HENT: Negative.    Eyes: Negative.   Respiratory: Negative.    Cardiovascular: Negative.   Gastrointestinal: Negative.   Endocrine: Negative.   Genitourinary: Negative.   Musculoskeletal: Negative.   Skin: Negative.   Allergic/Immunologic: Negative.   Neurological: Negative.   Hematological: Negative.   Psychiatric/Behavioral:  Negative.    All other systems reviewed and are negative.     Objective:   Vitals:   05/04/21 1541  BP: 102/62  Pulse: 94  Resp: 16  Temp: 98 F (36.7 C)  SpO2: 98%  Weight: 214 lb 3.2 oz (97.2 kg)  Height: 5\' 7"  (1.702 m)    Body mass index is 33.55 kg/m.  Physical Exam Vitals and nursing note reviewed.  Constitutional:      General: She is not in acute distress.    Appearance: Normal appearance. She is well-developed, well-groomed and overweight. She is not ill-appearing, toxic-appearing or diaphoretic.     Interventions: Face mask in place.  HENT:     Head: Normocephalic and atraumatic.     Right Ear: External ear normal.     Left Ear: External ear normal.  Eyes:     General: Lids are normal. No scleral icterus.       Right eye: No discharge.        Left eye: No discharge.     Conjunctiva/sclera: Conjunctivae normal.  Neck:     Trachea: Phonation normal. No tracheal deviation.  Cardiovascular:     Rate and Rhythm: Normal rate and regular rhythm.     Pulses: Normal pulses.          Radial pulses are 2+ on the right side and 2+ on the left side.       Posterior tibial pulses are 2+ on the right side and 2+ on the left side.     Heart sounds: Normal heart sounds. No murmur heard.   No friction rub. No gallop.  Pulmonary:     Effort: Pulmonary effort is normal. No tachypnea, accessory muscle usage or respiratory distress.     Breath sounds: Normal breath sounds. No stridor or transmitted upper airway sounds. No wheezing, rhonchi or rales.     Comments: Poor inspiratory effort, single wheeze heard once, encouraged better effort and no wheeze auscultated again Chest:     Chest wall: Tenderness present. No mass, deformity, swelling or crepitus.  Abdominal:     General: Bowel sounds are normal. There is no distension.     Palpations: Abdomen is soft.  Musculoskeletal:     Right lower leg: No edema.     Left lower leg: No edema.  Lymphadenopathy:     Upper Body:      Right upper body: Axillary adenopathy and pectoral adenopathy present. No supraclavicular adenopathy.     Left upper body: Axillary adenopathy and pectoral adenopathy present. No supraclavicular adenopathy.  Skin:    General: Skin is warm and dry.     Coloration: Skin is not jaundiced or pale.     Findings: No rash.  Neurological:     Mental Status: She is alert.     Motor: No abnormal muscle tone.     Gait: Gait normal.  Psychiatric:  Mood and Affect: Mood normal.        Speech: Speech normal.        Behavior: Behavior normal. Behavior is cooperative.     Results for orders placed or performed in visit on 03/22/21  POCT HgB A1C  Result Value Ref Range   Hemoglobin A1C 5.6 4.0 - 5.6 %   HbA1c POC (<> result, manual entry)     HbA1c, POC (prediabetic range)     HbA1c, POC (controlled diabetic range)         Assessment & Plan:     ICD-10-CM   1. Chest wall pain  R07.89    suspect pain may be post-vaccine lymphadenopathy and expected immune response, should resolve in the next 1-2 weeks, NSAIDs/tylenol    2. Lymphadenopathy, axillary  R59.0    reassured pt, should resolve in the next few weeks, some COVID vaccine lymphadenopathy noted on mammograms 6 weeks after, expect improvement x 1 month     3. Encounter for examination following treatment at hospital  Z09    all records labs imaging results and note reviewed with pt, she is improving    4. Wheeze  R06.2    faint wheeze w hx of bronchitis- inhaler refilled to have available to try if any respiratory sx accompany pain episodes     Encouraged pt to f/up in 3-4 weeks if sx have not resolved     Danelle Berry, PA-C 05/04/21 4:11 PM

## 2021-06-07 ENCOUNTER — Encounter: Payer: Self-pay | Admitting: Family Medicine

## 2021-06-08 ENCOUNTER — Encounter: Payer: Self-pay | Admitting: Family Medicine

## 2021-06-08 ENCOUNTER — Other Ambulatory Visit: Payer: Self-pay

## 2021-06-08 ENCOUNTER — Ambulatory Visit: Payer: BC Managed Care – PPO | Admitting: Family Medicine

## 2021-06-08 VITALS — BP 112/76 | HR 94 | Ht 67.0 in | Wt 215.0 lb

## 2021-06-08 DIAGNOSIS — G43009 Migraine without aura, not intractable, without status migrainosus: Secondary | ICD-10-CM
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
## 2021-06-08 NOTE — Patient Instructions (Signed)
Below is our plan:  We will continue verapamil 80mg  twice daily, topiramate 50mg  daily and gabapentin 100mg  three times daily. We are going to use Nurtec every evening starting tonight and continuing daily for 5-6 days. Take 1 Nurtec with 25mg  of Benadryl, 1000mg  Tylenol and 600mg  ibuprofen. I also want you to take 1000mg  of Tylenol with an Allegra in the mornings for the same time frame.   Read about sleep apnea. If morning headaches continue to be an issue, please let me know and I will refer you to sleep medicine.   Please make sure you are staying well hydrated. I recommend 50-60 ounces daily. Well balanced diet and regular exercise encouraged. Consistent sleep schedule with 6-8 hours recommended.   Please continue follow up with care team as directed.   Follow up with me in 1 year if doing well, if headaches continue to worsen call me and we will get you in sooner.   You may receive a survey regarding today's visit. I encourage you to leave honest feed back as I do use this information to improve patient care. Thank you for seeing me today!

## 2021-09-06 ENCOUNTER — Telehealth (INDEPENDENT_AMBULATORY_CARE_PROVIDER_SITE_OTHER): Payer: BC Managed Care – PPO | Admitting: Nurse Practitioner

## 2021-09-06 ENCOUNTER — Encounter: Payer: Self-pay | Admitting: Nurse Practitioner

## 2021-09-06 ENCOUNTER — Other Ambulatory Visit: Payer: Self-pay

## 2021-09-06 ENCOUNTER — Ambulatory Visit: Payer: Self-pay | Admitting: *Deleted

## 2021-09-06 DIAGNOSIS — U071 COVID-19: Secondary | ICD-10-CM | POA: Diagnosis not present

## 2021-09-06 MED ORDER — BENZONATATE 100 MG PO CAPS: 200.0000 mg | ORAL_CAPSULE | Freq: Three times a day (TID) | ORAL | 0 refills | Status: DC | PRN

## 2021-09-06 NOTE — Telephone Encounter (Signed)
°  Chief Complaint: sore throat, ear pain, nasal congestion, coughing Symptoms: coughing green mucus, nasal congestion and sore throat Frequency: constantly Pertinent Negatives: Patient denies shortness of breath or chest tightness or wheezing Disposition: [] ED /[] Urgent Care (no appt availability in office) / [x] Appointment(In office/virtual)/ []  [facility]/ [] Home Care/ [] Refused Recommended Disposition  Additional Notes: I made a MyChart virtual visit for today with , FNP at 1:00. I went over the care advice and s/s to go to the urgent care for like chest tightness, shortness of breath.   Pt was agreeable to this plan and thanked me for my help.

## 2021-09-06 NOTE — Progress Notes (Signed)
Name: Brittney Johns   MRN: [id number]    DOB: [birth year]   Date:09/06/2021       Progress Note  Subjective  Chief Complaint  Chief Complaint  Patient presents with   Covid Positive    Was checked this morning at [facility], sore throat, cough, congested, headache  since yesterday    I connected with  [name]  on 09/06/21 at 12:30 pm by a video enabled telemedicine application and verified that I am speaking with the correct person using two identifiers.  I discussed the limitations of evaluation and management by telemedicine and the availability of in person appointments. The patient expressed understanding and agreed to proceed with a virtual visit  Staff also discussed with the patient that there may be a patient responsible charge related to this service. Patient Location: home Provider Location: [facility] Additional Individuals present: alone  HPI  Covid-19: She says that her symptoms started with a sore throat on Sunday.  She says on Monday she continued to have a sore throat, clear runny nose and headache.  She denies any fever, shortness of breath or wheezing.  She says she has a little bit of a cough.  She says she went and got tested today for Covid and was positive.  Discussed that she is not high risk for complications, so anti-virals are not recommended.  Discussed OTC treatments for symptoms and to push fluids.  Will send in tessalon perls for cough.  Discussed CDC recommendations for quarantine and reasons to seek emergency care.   Patient Active Problem List   Diagnosis Date Noted   Prediabetes 11/12/2020   Class 1 obesity with body mass index (BMI) of 32.0 to 32.9 in adult 09/20/2018   Family history of colon cancer requiring screening colonoscopy    Status post vaginal hysterectomy 04/11/2016   Anxiety 04/11/2016   Allergic rhinitis 12/31/2015   Constipation 12/23/2015   Surgical menopause 04/07/2015   Chronic right hip pain 04/05/2015   Endometriosis    IBS  (irritable bowel syndrome)    Migraine without aura 10/17/2013   Headache 10/17/2013   FOM (frequency of micturition) 04/24/2013   Incomplete bladder emptying 04/24/2013   Chronic interstitial cystitis 04/24/2013    Social History   Tobacco Use   Smoking status: Never   Smokeless tobacco: Never  Substance Use Topics   Alcohol use: No    Alcohol/week: 0.0 standard drinks     Current Outpatient Medications:    albuterol (VENTOLIN HFA) 108 (90 Base) MCG/ACT inhaler, Inhale 2 puffs into the lungs every 6 (six) hours as needed for wheezing or shortness of breath., Disp: 18 g, Rfl: 1   Biotin 5000 MCG CAPS, Take by mouth., Disp: , Rfl:    estradiol (ESTRACE) 1 MG tablet, TAKE 1 AND 1/2 TABLETS(1.5 MG) BY MOUTH DAILY, Disp: 135 tablet, Rfl: 1   fluticasone (FLONASE) 50 MCG/ACT nasal spray, Place 2 sprays into both nostrils daily., Disp: 16 g, Rfl: 6   gabapentin (NEURONTIN) 100 MG capsule, Take 1 capsule (100 mg total) by mouth 3 (three) times daily., Disp: 270 capsule, Rfl: 3   imipramine (TOFRANIL) 25 MG tablet, Take 25 mg by mouth daily. , Disp: , Rfl:    meloxicam (MOBIC) 15 MG tablet, Take 1 tablet (15 mg total) by mouth daily., Disp: 30 tablet, Rfl: 3   Multiple Vitamins-Minerals (MULTIVITAMIN WITH MINERALS) tablet, Take 1 tablet by mouth daily., Disp: , Rfl:    Rimegepant Sulfate (NURTEC) 75 MG TBDP,  Take 75 mg by mouth daily as needed (take for abortive therapy of migraine, no more than 1 tablet in 24 hours or 10 per month)., Disp: 10 tablet, Rfl: 11   tiZANidine (ZANAFLEX) 4 MG tablet, tizanidine 4 mg tablet  TAKE 1 TABLET BY MOUTH AT BEDTIME AS NEEDED, Disp: , Rfl:    topiramate (TOPAMAX) 50 MG tablet, TAKE 1 TABLET BY MOUTH DAILY., Disp: 90 tablet, Rfl: 3   verapamil (CALAN) 80 MG tablet, TAKE 1 TABLET (80 MG TOTAL) BY MOUTH 2 (TWO) TIMES DAILY., Disp: 180 tablet, Rfl: 3   traMADol (ULTRAM) 50 MG tablet, tramadol 50 mg tablet  TAKE 1 TABLET BY MOUTH TWICE DAILY AS NEEDED, Disp: ,  Rfl:   Allergies  Allergen Reactions   Imitrex [Sumatriptan] Anaphylaxis    I personally reviewed active problem list, medication list, allergies, notes from last encounter with the patient/caregiver today.  ROS  Constitutional: Negative for fever or weight change.  HEENT: positive for nasal congestion and drainage, and sore throat Respiratory: Positive for cough, negative for shortness of breath.   Cardiovascular: Negative for chest pain or palpitations.  Gastrointestinal: Negative for abdominal pain, no bowel changes.  Musculoskeletal: Negative for gait problem or joint swelling.  Skin: Negative for rash.  Neurological: Negative for dizziness, positive for headache.  No other specific complaints in a complete review of systems (except as listed in HPI above).   Objective  Virtual encounter, vitals not obtained.  There is no height or weight on file to calculate BMI.  Nursing Note and Vital Signs reviewed.  Physical Exam  Awake, alert and oriented, speaking in complete sentences  No results found for this or any previous visit (from the past 72 hour(s)).  Assessment & Plan 1. COVID-19 -discussed OTC treatments for symptoms -push fluids and get rest - benzonatate (TESSALON) 100 MG capsule; Take 2 capsules (200 mg total) by mouth 3 (three) times daily as needed for cough.  Dispense: 20 capsule; Refill: 0   -Red flags and when to present for emergency care or RTC including fever >101.61F, chest pain, shortness of breath, new/worsening/un-resolving symptoms, reviewed with patient at time of visit. Follow up and care instructions discussed and provided in AVS. - I discussed the assessment and treatment plan with the patient. The patient was provided an opportunity to ask questions and all were answered. The patient agreed with the plan and demonstrated an understanding of the instructions.  I provided 15 minutes of non-face-to-face time during this encounter.  Berniece Salines,  FNP

## 2021-09-06 NOTE — Telephone Encounter (Signed)
Reason for Disposition  [1] SEVERE sore throat AND [2] present > 24 hours  Answer Assessment - Initial Assessment Questions 1. ONSET: "When did the nasal discharge start?"      I returned her call.    She has a sore throat since Sunday.   No fever.   Over night I have a lot of head congestion.    2. AMOUNT: "How much discharge is there?"      Clear from my nose 3. COUGH: "Do you have a cough?" If yes, ask: "Describe the color of your sputum" (clear, white, yellow, green)     Yes green mucus Yesterday I took Mucinex cough syrup and cough drops which helped some.    Having some headaches from the congestion.    I get sinus infections. 4. RESPIRATORY DISTRESS: "Describe your breathing."      No 5. FEVER: "Do you have a fever?" If Yes, ask: "What is your temperature, how was it measured, and when did it start?"     No 6. SEVERITY: "Overall, how bad are you feeling right now?" (e.g., doesn't interfere with normal activities, staying home from school/work, staying in bed)      Not asked 7. OTHER SYMPTOMS: "Do you have any other symptoms?" (e.g., sore throat, earache, wheezing, vomiting)     Sore throat mostly.    My left ear hurt earlier today.   Tylenol.  Taken which helped.   No diarrhea or vomiting.   8. PREGNANCY: "Is there any chance you are pregnant?" "When was your last menstrual period?"     Not asked  Protocols used: Common Cold-A-AH

## 2021-09-17 ENCOUNTER — Other Ambulatory Visit: Payer: Self-pay | Admitting: Obstetrics and Gynecology

## 2021-09-17 DIAGNOSIS — Z01419 Encounter for gynecological examination (general) (routine) without abnormal findings: Secondary | ICD-10-CM

## 2021-09-22 ENCOUNTER — Ambulatory Visit: Payer: BC Managed Care – PPO | Admitting: Internal Medicine

## 2021-09-22 ENCOUNTER — Encounter: Payer: Self-pay | Admitting: Internal Medicine

## 2021-09-22 VITALS — BP 118/62 | HR 86 | Temp 97.7°F | Resp 16 | Ht 67.0 in | Wt 209.5 lb

## 2021-09-22 DIAGNOSIS — R7303 Prediabetes: Secondary | ICD-10-CM

## 2021-09-22 DIAGNOSIS — Z13220 Encounter for screening for lipoid disorders: Secondary | ICD-10-CM | POA: Diagnosis not present

## 2021-09-22 DIAGNOSIS — G43009 Migraine without aura, not intractable, without status migrainosus: Secondary | ICD-10-CM | POA: Diagnosis not present

## 2021-09-22 DIAGNOSIS — K5900 Constipation, unspecified: Secondary | ICD-10-CM

## 2021-09-22 DIAGNOSIS — G629 Polyneuropathy, unspecified: Secondary | ICD-10-CM

## 2021-09-22 NOTE — Patient Instructions (Addendum)
It was great seeing you today!  Plan discussed at today's visit: -Blood work ordered today, results will be uploaded to MyChart.  -Recommend flu shot in 1 month  Follow up in: 6 months   Take care and let us know if you have any questions or concerns prior to your next visit.  Dr. Caralee Ates
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
## 2021-09-23 ENCOUNTER — Encounter: Payer: Self-pay | Admitting: Nurse Practitioner

## 2021-09-23 LAB — COMPLETE METABOLIC PANEL WITH GFR
AG Ratio: 1.5 (calc) (ref 1.0–2.5)
ALT: 15 U/L (ref 6–29)
AST: 24 U/L (ref 10–35)
Albumin: 4 g/dL (ref 3.6–5.1)
Alkaline phosphatase (APISO): 48 U/L (ref 37–153)
BUN: 12 mg/dL (ref 7–25)
CO2: 30 mmol/L (ref 20–32)
Calcium: 9.5 mg/dL (ref 8.6–10.4)
Chloride: 106 mmol/L (ref 98–110)
Creat: 0.9 mg/dL (ref 0.50–1.03)
Globulin: 2.6 g/dL (calc) (ref 1.9–3.7)
Glucose, Bld: 83 mg/dL (ref 65–99)
Potassium: 3.5 mmol/L (ref 3.5–5.3)
Sodium: 143 mmol/L (ref 135–146)
Total Bilirubin: 0.3 mg/dL (ref 0.2–1.2)
Total Protein: 6.6 g/dL (ref 6.1–8.1)
eGFR: 77 mL/min/{1.73_m2} (ref >=60)

## 2021-09-23 LAB — CBC WITH DIFFERENTIAL/PLATELET
Absolute Monocytes: 643 cells/uL (ref 200–950)
Basophils Absolute: 63 cells/uL (ref 0–200)
Basophils Relative: 1 %
Eosinophils Absolute: 139 cells/uL (ref 15–500)
Eosinophils Relative: 2.2 %
HCT: 38.8 % (ref 35.0–45.0)
Hemoglobin: 12.7 g/dL (ref 11.7–15.5)
Lymphs Abs: 2041 cells/uL (ref 850–3900)
MCH: 30.4 pg (ref 27.0–33.0)
MCHC: 32.7 g/dL (ref 32.0–36.0)
MCV: 92.8 fL (ref 80.0–100.0)
MPV: 11.4 fL (ref 7.5–12.5)
Monocytes Relative: 10.2 %
Neutro Abs: 3415 cells/uL (ref 1500–7800)
Neutrophils Relative %: 54.2 %
Platelets: 392 10*3/uL (ref 140–400)
RBC: 4.18 10*6/uL (ref 3.80–5.10)
RDW: 12.5 % (ref 11.0–15.0)
Total Lymphocyte: 32.4 %
WBC: 6.3 10*3/uL (ref 3.8–10.8)

## 2021-09-23 LAB — HEMOGLOBIN A1C
Hgb A1c MFr Bld: 5.7 % of total Hgb — ABNORMAL HIGH (ref <5.7)
Mean Plasma Glucose: 117 mg/dL
eAG (mmol/L): 6.5 mmol/L

## 2021-09-23 LAB — LIPID PANEL
Cholesterol: 181 mg/dL (ref <200)
HDL: 73 mg/dL (ref >=50)
LDL Cholesterol (Calc): 92 mg/dL (calc)
Non-HDL Cholesterol (Calc): 108 mg/dL (calc) (ref <130)
Total CHOL/HDL Ratio: 2.5 (calc) (ref <5.0)
Triglycerides: 74 mg/dL (ref <150)

## 2021-09-23 NOTE — Addendum Note (Signed)
Addended by: Teodora Medici on: 09/23/2021 03:26 PM   Modules accepted: Orders

## 2021-10-06 ENCOUNTER — Encounter: Payer: Self-pay | Admitting: Nurse Practitioner

## 2021-10-11 ENCOUNTER — Encounter: Payer: Self-pay | Admitting: Obstetrics and Gynecology

## 2021-11-01 ENCOUNTER — Encounter: Payer: BC Managed Care – PPO | Admitting: Obstetrics and Gynecology

## 2021-11-01 DIAGNOSIS — Z01419 Encounter for gynecological examination (general) (routine) without abnormal findings: Secondary | ICD-10-CM

## 2021-11-01 DIAGNOSIS — Z1231 Encounter for screening mammogram for malignant neoplasm of breast: Secondary | ICD-10-CM

## 2021-11-03 ENCOUNTER — Ambulatory Visit: Payer: BC Managed Care – PPO | Admitting: Family Medicine

## 2021-11-10 ENCOUNTER — Ambulatory Visit: Payer: BC Managed Care – PPO | Admitting: Family Medicine

## 2021-11-14 ENCOUNTER — Other Ambulatory Visit: Payer: Self-pay

## 2021-11-14 MED ORDER — GABAPENTIN 100 MG PO CAPS: 100.0000 mg | ORAL_CAPSULE | Freq: Three times a day (TID) | ORAL | 2 refills | Status: DC

## 2021-11-22 ENCOUNTER — Other Ambulatory Visit: Payer: Self-pay

## 2021-11-22 MED ORDER — VERAPAMIL HCL 80 MG PO TABS: ORAL_TABLET | ORAL | 1 refills | Status: DC

## 2021-11-28 ENCOUNTER — Ambulatory Visit (INDEPENDENT_AMBULATORY_CARE_PROVIDER_SITE_OTHER): Payer: BC Managed Care – PPO

## 2021-11-28 ENCOUNTER — Ambulatory Visit: Payer: BC Managed Care – PPO | Admitting: Podiatry

## 2021-11-28 ENCOUNTER — Encounter: Payer: Self-pay | Admitting: Podiatry

## 2021-11-28 ENCOUNTER — Other Ambulatory Visit: Payer: Self-pay

## 2021-11-28 DIAGNOSIS — M2041 Other hammer toe(s) (acquired), right foot: Secondary | ICD-10-CM

## 2021-11-28 DIAGNOSIS — S93692A Other sprain of left foot, initial encounter: Secondary | ICD-10-CM

## 2021-11-28 DIAGNOSIS — S93602A Unspecified sprain of left foot, initial encounter: Secondary | ICD-10-CM | POA: Diagnosis not present

## 2021-11-28 DIAGNOSIS — S93492A Sprain of other ligament of left ankle, initial encounter: Secondary | ICD-10-CM

## 2021-11-28 DIAGNOSIS — M76812 Anterior tibial syndrome, left leg: Secondary | ICD-10-CM

## 2021-11-28 DIAGNOSIS — M7672 Peroneal tendinitis, left leg: Secondary | ICD-10-CM

## 2021-11-28 NOTE — Progress Notes (Signed)
She presents today with chief complaint of pain to her left foot and ankle she states that the second metatarsophalangeal joint on the right foot is painful as well and has been so since surgery.  Her date of injury to her left ankle where she twisted it stepping off a uneven sidewalk November 01, 2021 was x-rayed and was told that she did not have a fracture.  She wore a boot and then the brace and is stated that it was still painful along the medial aspect of the foot.  It hurts over here as she points to the lateral aspect as well. ? ?Radiographs of the bilateral foot taken today demonstrate an osseously mature individual with no fractures either foot.  She does retain internal fixation to the first second and fifth metatarsals of the right foot and the right second metatarsal the screws are 2 threads through the very plantar surface which could be causing her some of the difficulty that she is experiencing. ? ?Objective: Vital signs are stable she is alert and oriented x3 there is no erythema just some mild edema left foot.  She has tenderness along the length of the tibialis anterior tendon from the ankle distally to its medial and plantar medial insertion around the medial cuneiform.  She also has pain on palpation to the peroneal tendons and the fifth metatarsal base which radiographically there did not appear to be any fracture.  She has tenderness along the plantar fascia which is exquisitely painful.  Significant pain on palpation of the anterior talofibular ligament calcaneofibular ligament appears to be intact and normal. ? ?Assessment: Probable tibialis anterior tendinitis cannot rule out a tear of the peroneal tendons.  Most likely a tear of the anterior talofibular ligament probable internal fixation pain second metatarsal right foot and Planter fasciitis left foot. ? ?Plan: Discussed etiology pathology conservative surgical therapies due to this not to improve rather than worsen over the past month  with immobilization I feel an MRI is necessary of the rear foot and ankle to not only evaluate the plantar fascia but the tibialis anterior tendon and the peroneal tendons as well.  I will follow-up with her once these come in. ?

## 2021-12-07 ENCOUNTER — Ambulatory Visit
Admission: RE | Admit: 2021-12-07 | Discharge: 2021-12-07 | Disposition: A | Discharge status: Home / Self Care | Payer: BC Managed Care – PPO | Source: Ambulatory Visit | Attending: Podiatry | Admitting: Podiatry

## 2021-12-07 DIAGNOSIS — M76812 Anterior tibial syndrome, left leg: Secondary | ICD-10-CM

## 2021-12-07 DIAGNOSIS — S93492A Sprain of other ligament of left ankle, initial encounter: Secondary | ICD-10-CM

## 2021-12-07 DIAGNOSIS — M7672 Peroneal tendinitis, left leg: Secondary | ICD-10-CM

## 2021-12-07 DIAGNOSIS — S93692A Other sprain of left foot, initial encounter: Secondary | ICD-10-CM

## 2021-12-13 ENCOUNTER — Encounter: Payer: Self-pay | Admitting: Podiatry

## 2021-12-21 ENCOUNTER — Ambulatory Visit: Payer: BC Managed Care – PPO | Admitting: Podiatry

## 2021-12-21 ENCOUNTER — Encounter: Payer: Self-pay | Admitting: Podiatry

## 2021-12-21 DIAGNOSIS — S93692D Other sprain of left foot, subsequent encounter: Secondary | ICD-10-CM | POA: Diagnosis not present

## 2021-12-21 DIAGNOSIS — T847XXA Infection and inflammatory reaction due to other internal orthopedic prosthetic devices, implants and grafts, initial encounter: Secondary | ICD-10-CM

## 2021-12-21 NOTE — Progress Notes (Signed)
She presents today for follow-up of her MRI and to discuss surgical intervention for her bilateral foot.  She denies any changes in her current problems. ? ?Objective: Vital signs stable alert oriented x3 she still has pain beneath the second metatarsal of the right foot.  Due to the painful internal fixation.  Left foot MRI did demonstrate Planter fasciitis. ? ?Assessment Planter fasciitis left.  Painful internal fixation right. ? ?Plan: Consented her today for an consent for removal of the screw of the second and fifth metatarsals of the right foot.  Also consented her for a endoscopic plantar fasciotomy left foot as well as a PRP injection left foot.  Answered all questions regarding this procedure the best my billing layman's terms she understood and was amenable to it signed out the patient's consent form and I will follow-up with her in the near future for surgical invention. ?

## 2021-12-27 ENCOUNTER — Ambulatory Visit (INDEPENDENT_AMBULATORY_CARE_PROVIDER_SITE_OTHER): Payer: BC Managed Care – PPO | Admitting: Obstetrics and Gynecology

## 2021-12-27 ENCOUNTER — Encounter: Payer: Self-pay | Admitting: Obstetrics and Gynecology

## 2021-12-27 VITALS — BP 118/78 | HR 93 | Ht 67.0 in | Wt 206.5 lb

## 2021-12-27 DIAGNOSIS — Z01419 Encounter for gynecological examination (general) (routine) without abnormal findings: Secondary | ICD-10-CM

## 2021-12-27 DIAGNOSIS — Z1231 Encounter for screening mammogram for malignant neoplasm of breast: Secondary | ICD-10-CM
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
## 2021-12-27 NOTE — Progress Notes (Signed)
Patients presents for annual exam today. Patient states a concern of weight gain, she says she eats healthy but her caregiver duties come first for her mother. Patient is due for mammogram, ordered. Patients annual labs are ordered. Patient states no other questions or concerns at this time.   ?

## 2021-12-28 ENCOUNTER — Other Ambulatory Visit: Payer: Self-pay | Admitting: Obstetrics and Gynecology

## 2021-12-28 ENCOUNTER — Telehealth: Payer: BC Managed Care – PPO | Admitting: Physician Assistant

## 2021-12-28 DIAGNOSIS — B9689 Other specified bacterial agents as the cause of diseases classified elsewhere: Secondary | ICD-10-CM

## 2021-12-28 DIAGNOSIS — J208 Acute bronchitis due to other specified organisms: Secondary | ICD-10-CM

## 2021-12-28 DIAGNOSIS — R7989 Other specified abnormal findings of blood chemistry: Secondary | ICD-10-CM

## 2021-12-28 LAB — BASIC METABOLIC PANEL
BUN/Creatinine Ratio: 15 (ref 9–23)
BUN: 12 mg/dL (ref 6–24)
CO2: 25 mmol/L (ref 20–29)
Calcium: 8.8 mg/dL (ref 8.7–10.2)
Chloride: 104 mmol/L (ref 96–106)
Creatinine, Ser: 0.8 mg/dL (ref 0.57–1.00)
Glucose: 86 mg/dL (ref 70–99)
Potassium: 3.9 mmol/L (ref 3.5–5.2)
Sodium: 141 mmol/L (ref 134–144)
eGFR: 89 mL/min/{1.73_m2} (ref >59)

## 2021-12-28 LAB — CBC
Hematocrit: 41 % (ref 34.0–46.6)
Hemoglobin: 13.6 g/dL (ref 11.1–15.9)
MCH: 30.7 pg (ref 26.6–33.0)
MCHC: 33.2 g/dL (ref 31.5–35.7)
MCV: 93 fL (ref 79–97)
Platelets: 370 10*3/uL (ref 150–450)
RBC: 4.43 x10E6/uL (ref 3.77–5.28)
RDW: 12.6 % (ref 11.7–15.4)
WBC: 5.4 10*3/uL (ref 3.4–10.8)

## 2021-12-28 LAB — LIPID PANEL
Chol/HDL Ratio: 2.2 ratio (ref 0.0–4.4)
Cholesterol, Total: 172 mg/dL (ref 100–199)
HDL: 78 mg/dL (ref >39)
LDL Chol Calc (NIH): 83 mg/dL (ref 0–99)
Triglycerides: 54 mg/dL (ref 0–149)
VLDL Cholesterol Cal: 11 mg/dL (ref 5–40)

## 2021-12-28 LAB — HEMOGLOBIN A1C
Est. average glucose Bld gHb Est-mCnc: 120 mg/dL
Hgb A1c MFr Bld: 5.8 % — ABNORMAL HIGH (ref 4.8–5.6)

## 2021-12-28 LAB — TSH: TSH: 0.426 u[IU]/mL — ABNORMAL LOW (ref 0.450–4.500)

## 2021-12-28 MED ORDER — PSEUDOEPH-BROMPHEN-DM 30-2-10 MG/5ML PO SYRP: 5.0000 mL | ORAL_SOLUTION | Freq: Four times a day (QID) | ORAL | 0 refills | Status: DC | PRN

## 2021-12-28 MED ORDER — PREDNISONE 20 MG PO TABS: 20.0000 mg | ORAL_TABLET | Freq: Every day | ORAL | 0 refills | Status: DC

## 2021-12-28 MED ORDER — AZITHROMYCIN 250 MG PO TABS: ORAL_TABLET | ORAL | 0 refills | Status: AC
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
## 2021-12-28 NOTE — Patient Instructions (Signed)
?[name], thank you for joining Margaretann Loveless, PA-C for today's virtual visit.  While this provider is not your primary care provider (PCP), if your PCP is located in our provider database this encounter information will be shared with them immediately following your visit. ? ?Consent: ?(Patient) Brittney Johns provided verbal consent for this virtual visit at the beginning of the encounter. ? ?Current Medications: ? ?Current Outpatient Medications:  ?  azithromycin (ZITHROMAX) 250 MG tablet, Take 2 tablets on day 1, then 1 tablet daily on days 2 through 5, Disp: 6 tablet, Rfl: 0 ?  brompheniramine-pseudoephedrine-DM 30-2-10 MG/5ML syrup, Take 5 mLs by mouth 4 (four) times daily as needed., Disp: 120 mL, Rfl: 0 ?  predniSONE (DELTASONE) 20 MG tablet, Take 1 tablet (20 mg total) by mouth daily with breakfast., Disp: 5 tablet, Rfl: 0 ?  albuterol (VENTOLIN HFA) 108 (90 Base) MCG/ACT inhaler, Inhale 2 puffs into the lungs every 6 (six) hours as needed for wheezing or shortness of breath., Disp: 18 g, Rfl: 1 ?  Biotin 5000 MCG CAPS, Take by mouth., Disp: , Rfl:  ?  estradiol (ESTRACE) 1 MG tablet, TAKE 1 AND 1/2 TABLETS(1.5 MG) BY MOUTH DAILY, Disp: 135 tablet, Rfl: 1 ?  fluticasone (FLONASE) 50 MCG/ACT nasal spray, Place 2 sprays into both nostrils daily., Disp: 16 g, Rfl: 6 ?  gabapentin (NEURONTIN) 100 MG capsule, Take 1 capsule (100 mg total) by mouth 3 (three) times daily., Disp: 270 capsule, Rfl: 2 ?  imipramine (TOFRANIL) 25 MG tablet, Take 25 mg by mouth daily. , Disp: , Rfl:  ?  meloxicam (MOBIC) 15 MG tablet, Take 1 tablet (15 mg total) by mouth daily., Disp: 30 tablet, Rfl: 3 ?  methocarbamol (ROBAXIN) 750 MG tablet, Take 750 mg by mouth 3 (three) times daily., Disp: , Rfl:  ?  Multiple Vitamins-Minerals (MULTIVITAMIN WITH MINERALS) tablet, Take 1 tablet by mouth daily., Disp: , Rfl:  ?  Rimegepant Sulfate (NURTEC) 75 MG TBDP, Take 75 mg by mouth daily as needed (take for abortive therapy  of migraine, no more than 1 tablet in 24 hours or 10 per month)., Disp: 10 tablet, Rfl: 11 ?  topiramate (TOPAMAX) 50 MG tablet, TAKE 1 TABLET BY MOUTH DAILY., Disp: 90 tablet, Rfl: 3 ?  traMADol (ULTRAM) 50 MG tablet, tramadol 50 mg tablet  TAKE 1 TABLET BY MOUTH TWICE DAILY AS NEEDED, Disp: , Rfl:  ?  verapamil (CALAN) 80 MG tablet, TAKE 1 TABLET (80 MG TOTAL) BY MOUTH 2 (TWO) TIMES DAILY., Disp: 180 tablet, Rfl: 1  ? ?Medications ordered in this encounter:  ?Meds ordered this encounter  ?Medications  ? azithromycin (ZITHROMAX) 250 MG tablet  ?  Sig: Take 2 tablets on day 1, then 1 tablet daily on days 2 through 5  ?  Dispense:  6 tablet  ?  Refill:  0  ?  Order Specific Question:   Supervising Provider  ?  Answer:   Eber Hong [[id number]]  ? predniSONE (DELTASONE) 20 MG tablet  ?  Sig: Take 1 tablet (20 mg total) by mouth daily with breakfast.  ?  Dispense:  5 tablet  ?  Refill:  0  ?  Order Specific Question:   Supervising Provider  ?  Answer:   Eber Hong [[id number]]  ? brompheniramine-pseudoephedrine-DM 30-2-10 MG/5ML syrup  ?  Sig: Take 5 mLs by mouth 4 (four) times daily as needed.  ?  Dispense:  120 mL  ?  Refill:  0  ?  Order Specific Question:   Supervising Provider  ?  Answer:   Eber Hong [[id number]]  ?  ? ?*If you need refills on other medications prior to your next appointment, please contact your pharmacy* ? ?Follow-Up: ?Call back or seek an in-person evaluation if the symptoms worsen or if the condition fails to improve as anticipated. ? ?Other Instructions ? ?Acute Bronchitis, Adult ?Acute bronchitis is sudden inflammation of the main airways (bronchi) that come off the windpipe (trachea) in the lungs. The swelling causes the airways to get smaller and make more mucus than normal. This can make it hard to breathe and can cause coughing or noisy breathing (wheezing). ?Acute bronchitis may last several weeks. The cough may last longer. Allergies, asthma, and exposure to smoke may make the condition  worse. ?What are the causes? ?This condition can be caused by germs and by substances that irritate the lungs, including: ?Cold and flu viruses. The most common cause of this condition is the virus that causes the common cold. ?Bacteria. This is less common. ?Breathing in substances that irritate the lungs, including: ?Smoke from cigarettes and other forms of tobacco. ?Dust and pollen. ?Fumes from household cleaning products, gases, or burned fuel. ?Indoor or outdoor air pollution. ?What increases the risk? ?The following factors may make you more likely to develop this condition: ?A weak body's defense system, also called the immune system. ?A condition that affects your lungs and breathing, such as asthma. ?What are the signs or symptoms? ?Common symptoms of this condition include: ?Coughing. This may bring up clear, yellow, or green mucus from your lungs (sputum). ?Wheezing. ?Runny or stuffy nose. ?Having too much mucus in your lungs (chest congestion). ?Shortness of breath. ?Aches and pains, including sore throat or chest. ?How is this diagnosed? ?This condition is usually diagnosed based on: ?Your symptoms and medical history. ?A physical exam. ?You may also have other tests, including tests to rule out other conditions, such as pneumonia. These tests include: ?A test of lung function. ?Test of a mucus sample to look for the presence of bacteria. ?Tests to check the oxygen level in your blood. ?Blood tests. ?Chest X-ray. ?How is this treated? ?Most cases of acute bronchitis clear up over time without treatment. Your health care provider may recommend: ?Drinking more fluids to help thin your mucus so it is easier to cough up. ?Taking inhaled medicine (inhaler) to improve air flow in and out of your lungs. ?Using a vaporizer or a humidifier. These are machines that add water to the air to help you breathe better. ?Taking a medicine that thins mucus and clears congestion (expectorant). ?Taking a medicine that  prevents or stops coughing (cough suppressant). ?It is notcommon to take an antibiotic medicine for this condition. ?Follow these instructions at home: ? ?Take over-the-counter and prescription medicines only as told by your health care provider. ?Use an inhaler, vaporizer, or humidifier as told by your health care provider. ?Take two teaspoons (10 mL) of honey at bedtime to lessen coughing at night. ?Drink enough fluid to keep your urine pale yellow. ?Do not use any products that contain nicotine or tobacco. These products include cigarettes, chewing tobacco, and vaping devices, such as e-cigarettes. If you need help quitting, ask your health care provider. ?Get plenty of rest. ?Return to your normal activities as told by your health care provider. Ask your health care provider what activities are safe for you. ?Keep all follow-up visits. This is important. ?How is this prevented? ?To  lower your risk of getting this condition again: ?Wash your hands often with soap and water for at least 20 seconds. If soap and water are not available, use hand sanitizer. ?Avoid contact with people who have cold symptoms. ?Try not to touch your mouth, nose, or eyes with your hands. ?Avoid breathing in smoke or chemical fumes. Breathing smoke or chemical fumes will make your condition worse. ?Get the flu shot every year. ?Contact a health care provider if: ?Your symptoms do not improve after 2 weeks. ?You have trouble coughing up the mucus. ?Your cough keeps you awake at night. ?You have a fever. ?Get help right away if you: ?Cough up blood. ?Feel pain in your chest. ?Have severe shortness of breath. ?Faint or keep feeling like you are going to faint. ?Have a severe headache. ?Have a fever or chills that get worse. ?These symptoms may represent a serious problem that is an emergency. Do not wait to see if the symptoms will go away. Get medical help right away. Call your local emergency services (911 in the [place] Do not drive  yourself to the hospital. ?Summary ?Acute bronchitis is inflammation of the main airways (bronchi) that come off the windpipe (trachea) in the lungs. The swelling causes the airways to get smaller and make more mucus

## 2021-12-28 NOTE — Progress Notes (Signed)
?Virtual Visit Consent  ? ?Brittney Johns, you are scheduled for a virtual visit with a [facility] provider today.   ?  ?Just as with appointments in the office, your consent must be obtained to participate.  Your consent will be active for this visit and any virtual visit you may have with one of our providers in the next 365 days.   ?  ?If you have a MyChart account, a copy of this consent can be sent to you electronically.  All virtual visits are billed to your insurance company just like a traditional visit in the office.   ? ?As this is a virtual visit, video technology does not allow for your provider to perform a traditional examination.  This may limit your provider's ability to fully assess your condition.  If your provider identifies any concerns that need to be evaluated in person or the need to arrange testing (such as labs, EKG, etc.), we will make arrangements to do so.   ?  ?Although advances in technology are sophisticated, we cannot ensure that it will always work on either your end or our end.  If the connection with a video visit is poor, the visit may have to be switched to a telephone visit.  With either a video or telephone visit, we are not always able to ensure that we have a secure connection.    ? ?I need to obtain your verbal consent now.   Are you willing to proceed with your visit today?  ?  ?LAVETA GILKEY has provided verbal consent on 12/28/2021 for a virtual visit (video or telephone). ?  ?Margaretann Loveless, PA-C  ? ?Date: 12/28/2021 5:27 PM ? ? ?Virtual Visit via Video Note  ? ?IMargaretann Loveless, connected with  Brittney Johns  ([id number], 1971-01-18) on 12/28/21 at  5:15 PM EDT by a video-enabled telemedicine application and verified that I am speaking with the correct person using two identifiers. ? ?Location: ?Patient: Virtual Visit Location Patient: Home ?Provider: Virtual Visit Location Provider: Home Office ?  ?I discussed the limitations of evaluation and  management by telemedicine and the availability of in person appointments. The patient expressed understanding and agreed to proceed.   ? ?History of Present Illness: ?Brittney Johns is a 51 y.o. who identifies as a female who was assigned female at birth, and is being seen today for URI symptoms. ? ?HPI: URI  ?This is a new problem. The current episode started in the past 7 days (3 days). The problem has been gradually worsening. Maximum temperature: possible subjective fever over night last night; was chilled, went to bed, woke up in drenched sweat. Did not check temperature. The fever has been present for Less than 1 day. Associated symptoms include congestion, coughing, headaches, rhinorrhea, sinus pain, a sore throat and swollen glands. Pertinent negatives include no ear pain or plugged ear sensation. Associated symptoms comments: Laryngitis, post nasal drainage, sweats, chills. Treatments tried: Mucinex, theraflu. The treatment provided no relief.   ? ? ?Problems:  ?Patient Active Problem List  ? Diagnosis Date Noted  ? Prediabetes 11/12/2020  ? Allergy 08/13/2020  ? Class 1 obesity with body mass index (BMI) of 32.0 to 32.9 in adult 09/20/2018  ? Family history of colon cancer requiring screening colonoscopy   ? Status post vaginal hysterectomy 04/11/2016  ? Anxiety 04/11/2016  ? Allergic rhinitis 12/31/2015  ? Constipation 12/23/2015  ? 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 Chronic right hip pain 04/05/2015  ?  Endometriosis   ? IBS (irritable bowel syndrome)   ? Migraine without aura 10/17/2013  ? Headache 10/17/2013  ? FOM (frequency of micturition) 04/24/2013  ? Incomplete bladder emptying 04/24/2013  ? Chronic interstitial cystitis 04/24/2013  ?  ?Allergies:  ?Allergies  ?Allergen Reactions  ? Imitrex [Sumatriptan] Anaphylaxis  ? ?Medications:  ?Current Outpatient Medications:  ?  azithromycin (ZITHROMAX) 250 MG tablet, Take 2 tablets on day 1, then 1 tablet daily on days 2 through 5, Disp: 6 tablet, Rfl:  0 ?  brompheniramine-pseudoephedrine-DM 30-2-10 MG/5ML syrup, Take 5 mLs by mouth 4 (four) times daily as needed., Disp: 120 mL, Rfl: 0 ?  predniSONE (DELTASONE) 20 MG tablet, Take 1 tablet (20 mg total) by mouth daily with breakfast., Disp: 5 tablet, Rfl: 0 ?  albuterol (VENTOLIN HFA) 108 (90 Base) MCG/ACT inhaler, Inhale 2 puffs into the lungs every 6 (six) hours as needed for wheezing or shortness of breath., Disp: 18 g, Rfl: 1 ?  Biotin 5000 MCG CAPS, Take by mouth., Disp: , Rfl:  ?  estradiol (ESTRACE) 1 MG tablet, TAKE 1 AND 1/2 TABLETS(1.5 MG) BY MOUTH DAILY, Disp: 135 tablet, Rfl: 1 ?  fluticasone (FLONASE) 50 MCG/ACT nasal spray, Place 2 sprays into both nostrils daily., Disp: 16 g, Rfl: 6 ?  gabapentin (NEURONTIN) 100 MG capsule, Take 1 capsule (100 mg total) by mouth 3 (three) times daily., Disp: 270 capsule, Rfl: 2 ?  imipramine (TOFRANIL) 25 MG tablet, Take 25 mg by mouth daily. , Disp: , Rfl:  ?  meloxicam (MOBIC) 15 MG tablet, Take 1 tablet (15 mg total) by mouth daily., Disp: 30 tablet, Rfl: 3 ?  methocarbamol (ROBAXIN) 750 MG tablet, Take 750 mg by mouth 3 (three) times daily., Disp: , Rfl:  ?  Multiple Vitamins-Minerals (MULTIVITAMIN WITH MINERALS) tablet, Take 1 tablet by mouth daily., Disp: , Rfl:  ?  Rimegepant Sulfate (NURTEC) 75 MG TBDP, Take 75 mg by mouth daily as needed (take for abortive therapy of migraine, no more than 1 tablet in 24 hours or 10 per month)., Disp: 10 tablet, Rfl: 11 ?  topiramate (TOPAMAX) 50 MG tablet, TAKE 1 TABLET BY MOUTH DAILY., Disp: 90 tablet, Rfl: 3 ?  traMADol (ULTRAM) 50 MG tablet, tramadol 50 mg tablet  TAKE 1 TABLET BY MOUTH TWICE DAILY AS NEEDED, Disp: , Rfl:  ?  verapamil (CALAN) 80 MG tablet, TAKE 1 TABLET (80 MG TOTAL) BY MOUTH 2 (TWO) TIMES DAILY., Disp: 180 tablet, Rfl: 1 ? ?Observations/Objective: ?Patient is well-developed, well-nourished in no acute distress.  ?Resting comfortably at home.  ?Head is normocephalic, atraumatic.  ?No labored breathing.   ?Speech is clear and coherent with logical content.  ?Patient is alert and oriented at baseline.  ? ? ?Assessment and Plan: ?1. Acute bacterial bronchitis ?- azithromycin (ZITHROMAX) 250 MG tablet; Take 2 tablets on day 1, then 1 tablet daily on days 2 through 5  Dispense: 6 tablet; Refill: 0 ?- predniSONE (DELTASONE) 20 MG tablet; Take 1 tablet (20 mg total) by mouth daily with breakfast.  Dispense: 5 tablet; Refill: 0 ?- brompheniramine-pseudoephedrine-DM 30-2-10 MG/5ML syrup; Take 5 mLs by mouth 4 (four) times daily as needed.  Dispense: 120 mL; Refill: 0 ? ?- Suspect bacterial bronchitis ?- Zpack prescribed ?- Prednisone and Bromfed DM for cough and inflammation ?- Saline nasal rinses ?- Salt water gargles ?- Flonase nightly ?- Push fluids ?- Rest ?- Seek in person evaluation if symptoms fail to improve or if they worsen ? ? ?  Follow Up Instructions: ?I discussed the assessment and treatment plan with the patient. The patient was provided an opportunity to ask questions and all were answered. The patient agreed with the plan and demonstrated an understanding of the instructions.  A copy of instructions were sent to the patient via MyChart unless otherwise noted below.  ? ? ?The patient was advised to call back or seek an in-person evaluation if the symptoms worsen or if the condition fails to improve as anticipated. ? ?Time:  ?I spent 13 minutes with the patient via telehealth technology discussing the above problems/concerns.   ? ?Margaretann LovelessJennifer M Vandy Tsuchiya, PA-C ?

## 2021-12-29 NOTE — Progress Notes (Signed)
Spoke with patient and gave provider recommendations. Lab apt scheduled for 5/25. ?

## 2022-01-08 ENCOUNTER — Encounter: Payer: Self-pay | Admitting: Emergency Medicine

## 2022-01-08 ENCOUNTER — Ambulatory Visit: Payer: BC Managed Care – PPO

## 2022-01-08 ENCOUNTER — Ambulatory Visit
Admission: EM | Admit: 2022-01-08 | Discharge: 2022-01-08 | Disposition: A | Discharge status: Home / Self Care | Payer: BC Managed Care – PPO | Attending: Emergency Medicine | Admitting: Emergency Medicine

## 2022-01-08 DIAGNOSIS — J0100 Acute maxillary sinusitis, unspecified: Secondary | ICD-10-CM

## 2022-01-08 DIAGNOSIS — J209 Acute bronchitis, unspecified: Secondary | ICD-10-CM | POA: Diagnosis not present

## 2022-01-08 LAB — POCT RAPID STREP A (OFFICE): Rapid Strep A Screen: NEGATIVE

## 2022-01-08 MED ORDER — FLUCONAZOLE 150 MG PO TABS: 150.0000 mg | ORAL_TABLET | Freq: Every day | ORAL | 0 refills | Status: DC

## 2022-01-08 MED ORDER — AMOXICILLIN-POT CLAVULANATE 875-125 MG PO TABS: 1.0000 | ORAL_TABLET | Freq: Two times a day (BID) | ORAL | 0 refills | Status: AC

## 2022-01-08 NOTE — ED Triage Notes (Signed)
Pt here with sore throat, cough and congestion x 1 week. Was treated via telehealth last week, but still has sxs.  ?

## 2022-01-08 NOTE — Discharge Instructions (Addendum)
Take the Augmentin and Diflucan as directed.  Take plain Mucinex as directed.  Follow up with your primary care provider.   ? ?
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
## 2022-01-11 ENCOUNTER — Ambulatory Visit: Payer: BC Managed Care – PPO | Admitting: Nurse Practitioner

## 2022-01-11 ENCOUNTER — Encounter: Payer: Self-pay | Admitting: Nurse Practitioner

## 2022-01-11 VITALS — BP 110/72 | HR 93 | Temp 97.8°F | Resp 16 | Ht 67.0 in | Wt 202.1 lb

## 2022-01-11 DIAGNOSIS — R7303 Prediabetes: Secondary | ICD-10-CM

## 2022-01-11 DIAGNOSIS — R7989 Other specified abnormal findings of blood chemistry: Secondary | ICD-10-CM

## 2022-01-11 NOTE — Progress Notes (Signed)
? ?BP 110/72   Pulse 93   Temp 97.8 ?F (36.6 ?C) (Oral)   Resp 16   Ht 5\' 7"  (1.702 m)   Wt 202 lb 1.6 oz (91.7 kg)   SpO2 99%   BMI 31.65 kg/m?   ? ?Subjective:  ? ? Patient ID: Brittney Johns, female    DOB: 1971/08/02, 51 y.o.   MRN: [id number] ? ?HPI: ?Brittney Johns is a 51 y.o. female ? ?Chief Complaint  ?Patient presents with  ? Follow-up  ?  Abnormal labs from GYN  ? ?Abnormal labs: She had an appoint with her GYN on 12/27/2021 and had lab work done.  She was concerned about her A1c being elevated and her abnormal TSH. ? ?Prediabetic: Her last A1c was 5.8 on 12/27/2021.  Discussed that she is prediabetic.  Discussed decreasing sugar and processed foods.  She denies any polydipsia, polyphasia and polyuria.  We will continue to monitor A1c and patient will work on diet. ? ?Abnormal TSH: Her TSH on 12/27/2021 was 0.426.  She is very concerned about this because she has not had issues with her thyroid in the past.  She denies any palpitations, constipation, changes in skin, or weight loss.  She does report that she has noticed that her hair is falling out.  Will check thyroid panel, if still abnormal will refer to endocrinology.  Patient is aware and agreeable to plan. ? ?Relevant past medical, surgical, family and social history reviewed and updated as indicated. Interim medical history since our last visit reviewed. ?Allergies and medications reviewed and updated. ? ?Review of Systems ? ?Constitutional: Negative for fever or weight change.  ?Respiratory: Negative for cough and shortness of breath.   ?Cardiovascular: Negative for chest pain or palpitations.  ?Gastrointestinal: Negative for abdominal pain, no bowel changes.  ?Musculoskeletal: Negative for gait problem or joint swelling.  ?Skin: Negative for rash.  ?Neurological: Negative for dizziness or headache.  ?No other specific complaints in a complete review of systems (except as listed in HPI above).  ? ?   ?Objective:  ?  ?BP 110/72   Pulse 93    Temp 97.8 ?F (36.6 ?C) (Oral)   Resp 16   Ht 5\' 7"  (1.702 m)   Wt 202 lb 1.6 oz (91.7 kg)   SpO2 99%   BMI 31.65 kg/m?   ?Wt Readings from Last 3 Encounters:  ?01/11/22 202 lb 1.6 oz (91.7 kg)  ?12/27/21 206 lb 8 oz (93.7 kg)  ?09/22/21 209 lb 8 oz (95 kg)  ?  ?Physical Exam ? ?Constitutional: Patient appears well-developed and well-nourished. Obese  No distress.  ?HEENT: head atraumatic, normocephalic, pupils equal and reactive to light, neck supple ?Cardiovascular: Normal rate, regular rhythm and normal heart sounds.  No murmur heard. No BLE edema. ?Pulmonary/Chest: Effort normal and breath sounds normal. No respiratory distress. ?Abdominal: Soft.  There is no tenderness. ?Psychiatric: Patient has a normal mood and affect. behavior is normal. Judgment and thought content normal.  ?Results for orders placed or performed during the hospital encounter of 01/08/22  ?POCT rapid strep A  ?Result Value Ref Range  ? Rapid Strep A Screen Negative Negative  ? ?   ?Assessment & Plan:  ? ?1. Prediabetes ?Continue to work on diet avoiding sugars and processed foods ? ?2. Abnormal TSH ?If patient is still hyperthyroid will send to endocrinology ?- Thyroid Panel With TSH  ? ?Follow up plan: ?Return if symptoms worsen or fail to improve. ? ? ? ? ? ?

## 2022-01-12 LAB — THYROID PANEL WITH TSH
Free Thyroxine Index: 2.1 (ref 1.4–3.8)
T3 Uptake: 23 % (ref 22–35)
T4, Total: 9 ug/dL (ref 5.1–11.9)
TSH: 0.48 mIU/L

## 2022-01-17 ENCOUNTER — Encounter: Payer: Self-pay | Admitting: Podiatry

## 2022-01-27 NOTE — Telephone Encounter (Signed)
Called pt to schedule no answer LM on VM for pt to call the office to schedule an appt. Let pt know I have some openings on Monday with Dr Sherryle Lis. ?

## 2022-01-30 ENCOUNTER — Other Ambulatory Visit: Payer: Self-pay

## 2022-01-30 ENCOUNTER — Ambulatory Visit: Payer: BC Managed Care – PPO | Admitting: Podiatry

## 2022-01-30 DIAGNOSIS — Q828 Other specified congenital malformations of skin: Secondary | ICD-10-CM | POA: Diagnosis not present

## 2022-01-30 MED ORDER — TOPIRAMATE 50 MG PO TABS: 50.0000 mg | ORAL_TABLET | Freq: Every day | ORAL | 1 refills | Status: DC

## 2022-01-31 ENCOUNTER — Encounter: Payer: Self-pay | Admitting: Podiatry

## 2022-01-31 NOTE — Progress Notes (Signed)
?  Subjective:  ?Patient ID: Brittney Johns, female    DOB: 12-26-1970,  MRN: [id number] ? ?Chief Complaint  ?Patient presents with  ? Callouses  ?  Painful skin lesion on right  ? ? ?51 y.o. female presents with the above complaint. History confirmed with patient.  She is a painful skin lesion on the right foot that is very severe.  She has upcoming surgery for Planter fasciitis with Dr. Al Corpus on the other foot. ? ?Objective:  ?Physical Exam: ?warm, good capillary refill, no trophic changes or ulcerative lesions, normal DP and PT pulses, normal sensory exam, and deep painful porokeratosis right foot submetatarsal 2. ? ?Assessment:  ? ?1. Porokeratosis   ? ? ? ?Plan:  ?Patient was evaluated and treated and all questions answered. ? ?All symptomatic hyperkeratoses were safely debrided with a sterile #15 blade to patient's level of comfort without incident. We discussed preventative and palliative care of these lesions including supportive and accommodative shoegear, padding, prefabricated and custom molded accommodative orthoses, use of a pumice stone and lotions/creams daily.  I recommend she use salicylic acid at home to alleviate the lesion gradually.  We discussed that if these do not go away quickly and may take several months to get it to a tolerable place.  We did discuss surgical excision as well but I cautioned her that excision can lead to worsened thickened scars and I typically do not recommend this except for severe cases.. ? ? ?Return if symptoms worsen or fail to improve.  ? ?

## 2022-02-02 ENCOUNTER — Telehealth: Payer: Self-pay | Admitting: Urology

## 2022-02-02 NOTE — Telephone Encounter (Signed)
DOS - 03/03/22  REMOVAL FIXATION DEEP 2,5 RIGHT --- 20680 EPF LEFT --- 78242   [employer] EFFECTIVE DATE - 09/18/21  PLAN DEDUCTIBLE - $1,250.00 W/ $1,250.00 REMAINING OUT OF POCKET - $4,890.00 W/ $4,324.64 REMAINING COINSURANCE - 20% COPAY - $0.00   NO PRIOR AUTH REQUIRED

## 2022-02-09 ENCOUNTER — Other Ambulatory Visit: Payer: BC Managed Care – PPO

## 2022-03-02 ENCOUNTER — Other Ambulatory Visit: Payer: Self-pay | Admitting: Podiatry

## 2022-03-02 MED ORDER — ONDANSETRON HCL 4 MG PO TABS: 4.0000 mg | ORAL_TABLET | Freq: Three times a day (TID) | ORAL | 0 refills | Status: DC | PRN

## 2022-03-02 MED ORDER — HYDROCODONE-ACETAMINOPHEN 10-325 MG PO TABS: 1.0000 | ORAL_TABLET | Freq: Four times a day (QID) | ORAL | 0 refills | Status: AC | PRN

## 2022-03-02 MED ORDER — CEPHALEXIN 500 MG PO CAPS: 500.0000 mg | ORAL_CAPSULE | Freq: Three times a day (TID) | ORAL | 0 refills | Status: DC

## 2022-03-03 DIAGNOSIS — Z4889 Encounter for other specified surgical aftercare: Secondary | ICD-10-CM | POA: Diagnosis not present

## 2022-03-03 DIAGNOSIS — M722 Plantar fascial fibromatosis: Secondary | ICD-10-CM | POA: Diagnosis not present

## 2022-03-08 ENCOUNTER — Encounter: Payer: Self-pay | Admitting: Podiatry

## 2022-03-08 ENCOUNTER — Ambulatory Visit (INDEPENDENT_AMBULATORY_CARE_PROVIDER_SITE_OTHER): Payer: BC Managed Care – PPO | Admitting: Podiatry

## 2022-03-08 DIAGNOSIS — T847XXA Infection and inflammatory reaction due to other internal orthopedic prosthetic devices, implants and grafts, initial encounter: Secondary | ICD-10-CM

## 2022-03-08 DIAGNOSIS — Z9889 Other specified postprocedural states: Secondary | ICD-10-CM

## 2022-03-08 DIAGNOSIS — S93692D Other sprain of left foot, subsequent encounter: Secondary | ICD-10-CM

## 2022-03-08 MED ORDER — CYCLOBENZAPRINE HCL 10 MG PO TABS: 10.0000 mg | ORAL_TABLET | ORAL | 0 refills | Status: DC | PRN

## 2022-03-08 MED ORDER — FLUCONAZOLE 150 MG PO TABS: 150.0000 mg | ORAL_TABLET | Freq: Once | ORAL | 0 refills | Status: AC

## 2022-03-08 NOTE — Progress Notes (Signed)
She presents today with her husband Leonette Most for postop visit #1 date of surgery 03/03/2022 endoscopic plantar fasciotomy with removal of internal fixation first and fifth metatarsals on the opposite foot.  She states that she has noticed some spasms and she has a yeast infection secondary to the antibiotics.  Objective: Vital signs are stable she is alert and oriented x3.  Pulses are palpable.  Dry sterile dressing intact once removed demonstrates sutures are intact margins well coapted minimal edema no erythema cellulitis drainage or odor there is a bruise to the dorsal aspect of the right foot only.  She has some tenderness on palpation of the left foot which is the endoscopic foot.  Assessment: Well-healing surgical foot.  Plan: Encouraged her to continue to wear the cam boot for every day 24 hours a day for the next week sutures will come out next week of both sites if possible.  I would like to follow-up with her at that time.  Redressed all of the surgical sites today.  Started her on cyclobenzaprine for the cramps and Diflucan for the yeast infection.

## 2022-03-13 ENCOUNTER — Telehealth: Payer: Self-pay | Admitting: *Deleted

## 2022-03-15 ENCOUNTER — Ambulatory Visit (INDEPENDENT_AMBULATORY_CARE_PROVIDER_SITE_OTHER): Payer: BC Managed Care – PPO | Admitting: Podiatry

## 2022-03-15 ENCOUNTER — Encounter: Payer: Self-pay | Admitting: Podiatry

## 2022-03-15 DIAGNOSIS — Z9889 Other specified postprocedural states: Secondary | ICD-10-CM

## 2022-03-15 DIAGNOSIS — S93692D Other sprain of left foot, subsequent encounter: Secondary | ICD-10-CM

## 2022-03-15 DIAGNOSIS — T847XXA Infection and inflammatory reaction due to other internal orthopedic prosthetic devices, implants and grafts, initial encounter: Secondary | ICD-10-CM

## 2022-03-15 MED ORDER — FLUCONAZOLE 150 MG PO TABS: 150.0000 mg | ORAL_TABLET | Freq: Once | ORAL | 2 refills | Status: AC

## 2022-03-15 NOTE — Progress Notes (Signed)
She presents today for her second postop visit date of surgery 03/03/2022 endoscopic plantar fasciotomy with PRP injection and removal of screws on the right foot.  She states that she has had some severe pain recently it has subsided to some degree but seems to grip her at some point during the daytime and then subside after about an hour does state that she has some sciatic type symptoms on the lateral aspect of her thigh and buttocks.  Objective: I see no signs of infection in the foot there is no erythema edema cellulitis drainage or odor sutures are intact all the sutures of the left foot were removed today no tenderness on palpation of the heel.  All of the sutures removed dorsal aspect of the right foot and no dehiscence is noted.  She is got great range of motion of the second metatarsal phalangeal joint.  No tenderness currently beneath the second metatarsal phalangeal joint.  Assessment: Pain in limb secondary to surgery.  Status post endoscopic plantar fasciotomy left  Status post removal screws second and fifth metatarsal right foot.  She will get back into her tennis shoes and continue the use of her night splint for the next month.

## 2022-03-23 ENCOUNTER — Ambulatory Visit: Payer: BC Managed Care – PPO | Admitting: Nurse Practitioner

## 2022-03-23 ENCOUNTER — Other Ambulatory Visit: Payer: Self-pay

## 2022-03-23 ENCOUNTER — Encounter: Payer: Self-pay | Admitting: Nurse Practitioner

## 2022-03-23 ENCOUNTER — Ambulatory Visit (INDEPENDENT_AMBULATORY_CARE_PROVIDER_SITE_OTHER): Payer: BC Managed Care – PPO | Admitting: Nurse Practitioner

## 2022-03-23 VITALS — BP 120/80 | HR 96 | Temp 98.2°F | Resp 16 | Ht 67.0 in | Wt 205.2 lb

## 2022-03-23 DIAGNOSIS — R7989 Other specified abnormal findings of blood chemistry: Secondary | ICD-10-CM

## 2022-03-23 DIAGNOSIS — K581 Irritable bowel syndrome with constipation: Secondary | ICD-10-CM

## 2022-03-23 DIAGNOSIS — G43009 Migraine without aura, not intractable, without status migrainosus: Secondary | ICD-10-CM

## 2022-03-23 DIAGNOSIS — R350 Frequency of micturition: Secondary | ICD-10-CM

## 2022-03-23 DIAGNOSIS — R7303 Prediabetes: Secondary | ICD-10-CM | POA: Diagnosis not present

## 2022-03-23 DIAGNOSIS — F331 Major depressive disorder, recurrent, moderate: Secondary | ICD-10-CM

## 2022-03-23 MED ORDER — CITALOPRAM HYDROBROMIDE 10 MG PO TABS: 10.0000 mg | ORAL_TABLET | Freq: Every day | ORAL | 0 refills | Status: DC

## 2022-03-23 NOTE — Assessment & Plan Note (Signed)
Continue taking verapamil 80 mg twice daily, Nurtec 75 mg as needed, topiramate 50 mg daily.

## 2022-03-23 NOTE — Assessment & Plan Note (Signed)
Patient depression has increased.  Years ago she was on Celexa but has not been on it for years.  Restart patient on Celexa 10 mg daily.

## 2022-03-23 NOTE — Progress Notes (Signed)
BP 120/80   Pulse 96   Temp 98.2 F (36.8 C) (Oral)   Resp 16   Ht 5\' 7"  (1.702 m)   Wt 205 lb 3.2 oz (93.1 kg)   SpO2 98%   BMI 32.14 kg/m    Subjective:    Patient ID: Brittney Johns, female    DOB: [birth year], 51 y.o.   MRN: [id number]  HPI: Brittney Johns is a 51 y.o. female  Chief Complaint  Patient presents with   Follow-up    6 month     Abnormal TSH: Patient had an abnormal TSH on 12/27/2021 and it was 0.426.  We redrew it on 26 2023 and it was back in normal range.  We will continue to monitor the situation patient denies any palpitations, constipation, or changes in skin. She does say that she has noticed a lot more hair loss then before and she says her weight goes up and down. Will recheck thyroid levels.    Prediabetes: His last A1c was 12/27/2021 it was 5.8.  Discussed with patient that she is prediabetic and to avoid/decrease processed foods and sugar in her diet.  He is not currently on medication.  She denies any polyuria, polydipsia or polyphasia. She says she has not been watching her diet lately but is going to work on it.   Migraine: She sees neurology for her migraines.  She is taking topiramate 50 mg daily and verapamil 80 mg BID. She does take OTC ibuprofen and occasionally uses Nurtec.  She says she is doing well.  Urinary frequency: She says she has a history of interstitial cystitis but has noticed that she has urinary frequency and urgency has increased.  Patient denies any fever, dysuria or CVA tenderness.  We will get urine dip and culture if indicated.  Patient was unable to use urine sample while in the office.  She says she will bring 1 back.  IBS: She says she is doing good with her IBS.  She says she has a BM probably every day or two. She says she is not using the miralax at this time.  She has been doing good.   Depression: PHQ9 score is positive.  Patient reports that her brother recently died from liver cancer.  She is currently in grad  school.  She says she is taking care of her mother who has dementia.  She says she has a lot going on she is teary-eyed in the exam room.  She denies any suicidal thoughts.  Patient reports she is just very stressed she has a lot going on and is very sad.  Discussed medication.  Patient states she has been on Celexa previously and tolerated that well.  We will send in Celexa.     03/23/2022    1:26 PM 09/22/2021    3:32 PM 09/06/2021   11:58 AM 05/04/2021    3:46 PM 03/22/2021    8:48 AM  Depression screen PHQ 2/9  Decreased Interest 2 0 0 0 0  Down, Depressed, Hopeless 2 0 0 0 0  PHQ - 2 Score 4 0 0 0 0  Altered sleeping 0 0  0   Tired, decreased energy 1 0  0   Change in appetite 0 0  0   Feeling bad or failure about yourself  0 0  0   Trouble concentrating 1 0  0   Moving slowly or fidgety/restless 0 0  0   Suicidal thoughts 0 0  0   PHQ-9 Score 6 0  0   Difficult doing work/chores Not difficult at all Not difficult at all  Not difficult at all        03/23/2022    1:29 PM 11/04/2018   10:21 AM  GAD 7 : Generalized Anxiety Score  Nervous, Anxious, on Edge 0 0  Control/stop worrying 0 0  Worry too much - different things 1 0  Trouble relaxing 0 0  Restless 0 0  Easily annoyed or irritable 0 0  Afraid - awful might happen 0 0  Total GAD 7 Score 1 0  Anxiety Difficulty Somewhat difficult Not difficult at all     Relevant past medical, surgical, family and social history reviewed and updated as indicated. Interim medical history since our last visit reviewed. Allergies and medications reviewed and updated.  Review of Systems  Constitutional: Negative for fever or weight change.  Respiratory: Negative for cough and shortness of breath.   Cardiovascular: Negative for chest pain or palpitations.  Gastrointestinal: Negative for abdominal pain, no bowel changes.  Musculoskeletal: Negative for gait problem or joint swelling.  Skin: Negative for rash.  Neurological: Negative for  dizziness or headache.  No other specific complaints in a complete review of systems (except as listed in HPI above).      Objective:    BP 120/80   Pulse 96   Temp 98.2 F (36.8 C) (Oral)   Resp 16   Ht 5\' 7"  (1.702 m)   Wt 205 lb 3.2 oz (93.1 kg)   SpO2 98%   BMI 32.14 kg/m   Wt Readings from Last 3 Encounters:  03/23/22 205 lb 3.2 oz (93.1 kg)  01/11/22 202 lb 1.6 oz (91.7 kg)  12/27/21 206 lb 8 oz (93.7 kg)    Physical Exam  Constitutional: Patient appears well-developed and well-nourished. Obese  No distress.  HEENT: head atraumatic, normocephalic, pupils equal and reactive to light,  neck supple Cardiovascular: Normal rate, regular rhythm and normal heart sounds.  No murmur heard. No BLE edema. Pulmonary/Chest: Effort normal and breath sounds normal. No respiratory distress. Abdominal: Soft.  There is no tenderness. Psychiatric: Patient has a sad mood and affect. behavior is normal. Judgment and thought content normal.   Results for orders placed or performed in visit on 01/11/22  Thyroid Panel With TSH  Result Value Ref Range   T3 Uptake 23 22 - 35 %   T4, Total 9.0 5.1 - 11.9 mcg/dL   Free Thyroxine Index 2.1 1.4 - 3.8   TSH 0.48 mIU/L      Assessment & Plan:   Problem List Items Addressed This Visit       Cardiovascular and Mediastinum   Migraine without aura    Continue taking verapamil 80 mg twice daily, Nurtec 75 mg as needed, topiramate 50 mg daily.       Relevant Medications   citalopram (CELEXA) 10 MG tablet     Digestive   IBS (irritable bowel syndrome)    condition stable at this time.        Other   Prediabetes   Moderate episode of recurrent major depressive disorder (HCC)    Patient depression has increased.  Years ago she was on Celexa but has not been on it for years.  Restart patient on Celexa 10 mg daily.      Relevant Medications   citalopram (CELEXA) 10 MG tablet   Other Visit Diagnoses     Abnormal TSH    -  Primary    Patient reports that she has noticed that she has had more hair loss recently.  We will check TSH.   Relevant Orders   TSH   Urinary frequency       We will get urine dip and culture if indicated.   Relevant Orders   POCT urinalysis dipstick   Urine Culture        Follow up plan: Return in about 4 weeks (around 04/20/2022) for follow up.

## 2022-03-23 NOTE — Assessment & Plan Note (Signed)
condition stable at this time.

## 2022-03-24 LAB — URINE CULTURE
MICRO NUMBER:: 13613414
Result:: NO GROWTH
SPECIMEN QUALITY:: ADEQUATE

## 2022-03-24 LAB — TSH: TSH: 0.57 mIU/L

## 2022-04-03 ENCOUNTER — Encounter: Payer: Self-pay | Admitting: Podiatry

## 2022-04-03 ENCOUNTER — Ambulatory Visit (INDEPENDENT_AMBULATORY_CARE_PROVIDER_SITE_OTHER): Payer: BC Managed Care – PPO | Admitting: Podiatry

## 2022-04-03 DIAGNOSIS — T847XXA Infection and inflammatory reaction due to other internal orthopedic prosthetic devices, implants and grafts, initial encounter: Secondary | ICD-10-CM

## 2022-04-03 DIAGNOSIS — Z9889 Other specified postprocedural states: Secondary | ICD-10-CM

## 2022-04-03 DIAGNOSIS — S93692D Other sprain of left foot, subsequent encounter: Secondary | ICD-10-CM

## 2022-04-03 NOTE — Progress Notes (Signed)
She presents today date of surgery 03/03/2022 she has a EPF left foot and removal internal fixation right foot.  She states that the right foot is doing just fine and then the endoscopic plantar fasciotomy still hurts considerably however it is upon standing after resting or sleeping.  She states that after she stands on it for a moment the pain resolves.  She states that that is not what her foot was doing prior to surgery.  States that the pain was consistent.  Objective: Vital signs are stable she alert oriented x3.  She is does have some hypertrophic scar development over the second metatarsophalangeal joint area which she knows how to handle from previous surgeries.  Her left foot does demonstrate some mild tenderness on palpation of the surgical site between each portal plantarly there is some fibrosis that is palpable most likely this is just scar tissue.  Assessment: Painful scar status post EPF left.  Resolving pain to the forefoot right after removal of internal fixation.  Plan: I have instructed her on how to massage this to help break up scar tissue.  We are also going to send her to [facility] physical therapy on [address] in [place].  Will follow-up with her once physical therapy has completed the treatment plan.

## 2022-04-10 ENCOUNTER — Telehealth: Payer: Self-pay | Admitting: Physical Therapy

## 2022-04-10 NOTE — Telephone Encounter (Signed)
Called pt to attempt to move up to earlier slot because of increased availability. Pt not available for open time slots and would like to keep appointment time the same.  

## 2022-04-11 ENCOUNTER — Ambulatory Visit: Payer: BC Managed Care – PPO | Attending: Podiatry | Admitting: Physical Therapy

## 2022-04-11 ENCOUNTER — Encounter: Payer: Self-pay | Admitting: Physical Therapy

## 2022-04-11 ENCOUNTER — Other Ambulatory Visit: Payer: Self-pay

## 2022-04-11 DIAGNOSIS — Z9889 Other specified postprocedural states: Secondary | ICD-10-CM | POA: Diagnosis not present

## 2022-04-11 DIAGNOSIS — S93692D Other sprain of left foot, subsequent encounter: Secondary | ICD-10-CM | POA: Insufficient documentation

## 2022-04-11 DIAGNOSIS — M79672 Pain in left foot: Secondary | ICD-10-CM | POA: Insufficient documentation

## 2022-04-11 DIAGNOSIS — R262 Difficulty in walking, not elsewhere classified: Secondary | ICD-10-CM | POA: Insufficient documentation
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
## 2022-04-13 ENCOUNTER — Encounter: Payer: Self-pay | Admitting: Physical Therapy

## 2022-04-13 ENCOUNTER — Ambulatory Visit: Payer: BC Managed Care – PPO | Admitting: Physical Therapy

## 2022-04-13 DIAGNOSIS — R262 Difficulty in walking, not elsewhere classified: Secondary | ICD-10-CM

## 2022-04-13 DIAGNOSIS — M79672 Pain in left foot: Secondary | ICD-10-CM
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
## 2022-04-17 ENCOUNTER — Ambulatory Visit: Payer: BC Managed Care – PPO | Admitting: Physical Therapy

## 2022-04-17 ENCOUNTER — Encounter: Payer: BC Managed Care – PPO | Admitting: Podiatry

## 2022-04-17 ENCOUNTER — Telehealth: Payer: Self-pay | Admitting: Physical Therapy

## 2022-04-17 NOTE — Telephone Encounter (Signed)
Called pt to inquiry about absence from PT. She reports forgetting about apt and she will be at next apt.

## 2022-04-18 ENCOUNTER — Telehealth: Payer: Self-pay | Admitting: Physical Therapy

## 2022-04-18 ENCOUNTER — Other Ambulatory Visit: Payer: Self-pay

## 2022-04-18 MED ORDER — VERAPAMIL HCL 80 MG PO TABS: 80.0000 mg | ORAL_TABLET | Freq: Two times a day (BID) | ORAL | 1 refills | Status: DC

## 2022-04-18 NOTE — Telephone Encounter (Signed)
Called pt to inquiry to see if she wanted to come into physical therapy earlier. Did not reach so left VM instructing pt to call back if she wanted to come in earlier.

## 2022-04-19 ENCOUNTER — Ambulatory Visit: Payer: BC Managed Care – PPO | Attending: Podiatry | Admitting: Physical Therapy

## 2022-04-19 ENCOUNTER — Encounter: Payer: Self-pay | Admitting: Physical Therapy

## 2022-04-19 DIAGNOSIS — R262 Difficulty in walking, not elsewhere classified: Secondary | ICD-10-CM

## 2022-04-19 DIAGNOSIS — M79672 Pain in left foot: Secondary | ICD-10-CM
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
## 2022-04-20 ENCOUNTER — Other Ambulatory Visit: Payer: Self-pay

## 2022-04-20 ENCOUNTER — Ambulatory Visit (INDEPENDENT_AMBULATORY_CARE_PROVIDER_SITE_OTHER): Payer: BC Managed Care – PPO | Admitting: Nurse Practitioner

## 2022-04-20 ENCOUNTER — Encounter: Payer: Self-pay | Admitting: Nurse Practitioner

## 2022-04-20 VITALS — BP 124/76 | HR 97 | Temp 98.1°F | Resp 18 | Ht 67.0 in | Wt 208.7 lb

## 2022-04-20 DIAGNOSIS — F331 Major depressive disorder, recurrent, moderate: Secondary | ICD-10-CM

## 2022-04-20 MED ORDER — CITALOPRAM HYDROBROMIDE 10 MG PO TABS: 10.0000 mg | ORAL_TABLET | Freq: Every day | ORAL | 1 refills | Status: DC

## 2022-04-20 NOTE — Progress Notes (Signed)
BP 124/76   Pulse 97   Temp 98.1 F (36.7 C) (Oral)   Resp 18   Ht 5\' 7"  (1.702 m)   Wt 208 lb 11.2 oz (94.7 kg)   SpO2 98%   BMI 32.69 kg/m    Subjective:    Patient ID: EVALENE VATH, female    DOB: 12/14/1970, 51 y.o.   MRN: [id number]  HPI: RAYYA YAGI is a 51 y.o. female  Chief Complaint  Patient presents with   Follow-up    4 week recheck   Depression: Last visit on 03/23/2022 patient restarted Celexa 10 mg daily. Patient reports she has been doing well. Patient states she feels like the medication is helping. Will continue with current treatment plan.  PHQ-9 has improved.     04/20/2022    2:12 PM 03/23/2022    1:26 PM 09/22/2021    3:32 PM 09/06/2021   11:58 AM 05/04/2021    3:46 PM  Depression screen PHQ 2/9  Decreased Interest 1 2 0 0 0  Down, Depressed, Hopeless 1 2 0 0 0  PHQ - 2 Score 2 4 0 0 0  Altered sleeping 0 0 0  0  Tired, decreased energy 1 1 0  0  Change in appetite 0 0 0  0  Feeling bad or failure about yourself  0 0 0  0  Trouble concentrating 0 1 0  0  Moving slowly or fidgety/restless 0 0 0  0  Suicidal thoughts 0 0 0  0  PHQ-9 Score 3 6 0  0  Difficult doing work/chores Not difficult at all Not difficult at all Not difficult at all  Not difficult at all    Relevant past medical, surgical, family and social history reviewed and updated as indicated. Interim medical history since our last visit reviewed. Allergies and medications reviewed and updated.  Review of Systems  Constitutional: Negative for fever or weight change.  Respiratory: Negative for cough and shortness of breath.   Cardiovascular: Negative for chest pain or palpitations.  Gastrointestinal: Negative for abdominal pain, no bowel changes.  Musculoskeletal: Negative for gait problem or joint swelling.  Skin: Negative for rash.  Neurological: Negative for dizziness or headache.  No other specific complaints in a complete review of systems (except as listed in HPI above).       Objective:    BP 124/76   Pulse 97   Temp 98.1 F (36.7 C) (Oral)   Resp 18   Ht 5\' 7"  (1.702 m)   Wt 208 lb 11.2 oz (94.7 kg)   SpO2 98%   BMI 32.69 kg/m   Wt Readings from Last 3 Encounters:  04/20/22 208 lb 11.2 oz (94.7 kg)  03/23/22 205 lb 3.2 oz (93.1 kg)  01/11/22 202 lb 1.6 oz (91.7 kg)    Physical Exam  Constitutional: Patient appears well-developed and well-nourished. Obese  No distress.  HEENT: head atraumatic, normocephalic, pupils equal and reactive to light,  neck supple Cardiovascular: Normal rate, regular rhythm and normal heart sounds.  No murmur heard. No BLE edema. Pulmonary/Chest: Effort normal and breath sounds normal. No respiratory distress. Abdominal: Soft.  There is no tenderness. Psychiatric: Patient has a normal mood and affect. behavior is normal. Judgment and thought content normal.  Results for orders placed or performed in visit on 03/23/22  Urine Culture   Specimen: Urine  Result Value Ref Range   MICRO NUMBER: 01/13/22    SPECIMEN QUALITY: Adequate  Sample Source URINE    STATUS: FINAL    Result: No Growth   TSH  Result Value Ref Range   TSH 0.57 mIU/L      Assessment & Plan:   Problem List Items Addressed This Visit       Other   Moderate episode of recurrent major depressive disorder (HCC) - Primary   Relevant Medications   citalopram (CELEXA) 10 MG tablet     Follow up plan: Return in about 6 months (around 10/21/2022) for follow up.

## 2022-04-24 ENCOUNTER — Encounter: Payer: BC Managed Care – PPO | Admitting: Physical Therapy

## 2022-04-25 ENCOUNTER — Ambulatory Visit: Payer: BC Managed Care – PPO | Admitting: Physical Therapy

## 2022-04-26 ENCOUNTER — Encounter: Payer: BC Managed Care – PPO | Admitting: Physical Therapy

## 2022-05-01 ENCOUNTER — Ambulatory Visit: Payer: BC Managed Care – PPO | Admitting: Physical Therapy

## 2022-05-01 ENCOUNTER — Encounter: Payer: Self-pay | Admitting: Physical Therapy

## 2022-05-01 DIAGNOSIS — M79672 Pain in left foot: Secondary | ICD-10-CM

## 2022-05-01 DIAGNOSIS — R262 Difficulty in walking, not elsewhere classified: Secondary | ICD-10-CM
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
## 2022-05-02 ENCOUNTER — Encounter: Payer: BC Managed Care – PPO | Admitting: Physical Therapy

## 2022-05-03 ENCOUNTER — Ambulatory Visit: Payer: BC Managed Care – PPO | Admitting: Physical Therapy

## 2022-05-04 ENCOUNTER — Ambulatory Visit: Payer: BC Managed Care – PPO | Admitting: Physical Therapy

## 2022-05-09 ENCOUNTER — Ambulatory Visit: Payer: BC Managed Care – PPO | Admitting: Physical Therapy

## 2022-05-09 ENCOUNTER — Encounter: Payer: Self-pay | Admitting: Physical Therapy

## 2022-05-09 DIAGNOSIS — M79672 Pain in left foot: Secondary | ICD-10-CM | POA: Diagnosis not present

## 2022-05-09 DIAGNOSIS — R262 Difficulty in walking, not elsewhere classified: Secondary | ICD-10-CM
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
## 2022-05-10 ENCOUNTER — Encounter: Payer: Self-pay | Admitting: Nurse Practitioner

## 2022-05-11 ENCOUNTER — Encounter: Payer: Self-pay | Admitting: Physical Therapy

## 2022-05-11 ENCOUNTER — Ambulatory Visit: Payer: BC Managed Care – PPO | Admitting: Physical Therapy

## 2022-05-11 DIAGNOSIS — R262 Difficulty in walking, not elsewhere classified: Secondary | ICD-10-CM

## 2022-05-11 DIAGNOSIS — M79672 Pain in left foot: Secondary | ICD-10-CM
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
## 2022-05-16 ENCOUNTER — Ambulatory Visit: Payer: BC Managed Care – PPO | Admitting: Physical Therapy

## 2022-05-16 ENCOUNTER — Other Ambulatory Visit: Payer: Self-pay | Admitting: Obstetrics and Gynecology

## 2022-05-16 DIAGNOSIS — Z01419 Encounter for gynecological examination (general) (routine) without abnormal findings: Secondary | ICD-10-CM

## 2022-05-17 ENCOUNTER — Encounter: Payer: BC Managed Care – PPO | Admitting: Physical Therapy

## 2022-05-18 ENCOUNTER — Ambulatory Visit: Payer: BC Managed Care – PPO | Admitting: Physical Therapy

## 2022-05-25 ENCOUNTER — Ambulatory Visit: Payer: BC Managed Care – PPO | Admitting: Physical Therapy

## 2022-06-01 ENCOUNTER — Ambulatory Visit: Payer: BC Managed Care – PPO | Attending: Podiatry | Admitting: Physical Therapy

## 2022-06-01 ENCOUNTER — Encounter: Payer: Self-pay | Admitting: Physical Therapy

## 2022-06-01 ENCOUNTER — Ambulatory Visit: Payer: BC Managed Care – PPO | Admitting: Physical Therapy

## 2022-06-01 DIAGNOSIS — R262 Difficulty in walking, not elsewhere classified: Secondary | ICD-10-CM | POA: Diagnosis present

## 2022-06-01 DIAGNOSIS — M79672 Pain in left foot: Secondary | ICD-10-CM | POA: Diagnosis present
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
## 2022-06-06 DIAGNOSIS — M7061 Trochanteric bursitis, right hip: Secondary | ICD-10-CM | POA: Insufficient documentation

## 2022-06-07 ENCOUNTER — Ambulatory Visit: Payer: BC Managed Care – PPO | Admitting: Physical Therapy

## 2022-06-07 ENCOUNTER — Encounter: Payer: Self-pay | Admitting: Physical Therapy

## 2022-06-07 DIAGNOSIS — M79672 Pain in left foot: Secondary | ICD-10-CM

## 2022-06-07 DIAGNOSIS — R262 Difficulty in walking, not elsewhere classified: Secondary | ICD-10-CM

## 2022-06-07 NOTE — Addendum Note (Signed)
Addended by: Daneil Dan on: 06/07/2022 06:42 PM   Modules accepted: Orders
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
## 2022-06-08 ENCOUNTER — Ambulatory Visit: Payer: BC Managed Care – PPO | Admitting: Family Medicine

## 2022-06-08 ENCOUNTER — Ambulatory Visit: Payer: BC Managed Care – PPO | Admitting: Physical Therapy

## 2022-06-08 ENCOUNTER — Encounter: Payer: Self-pay | Admitting: Family Medicine

## 2022-06-11 ENCOUNTER — Telehealth: Payer: BC Managed Care – PPO | Admitting: Family Medicine

## 2022-06-11 DIAGNOSIS — T25122A Burn of first degree of left foot, initial encounter: Secondary | ICD-10-CM
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
## 2022-06-11 NOTE — Patient Instructions (Signed)
Burn Care, Adult A burn is an injury to the skin or the tissues under the skin that is caused by a fire, hot liquid, chemical, or electricity. There are three types of burns: First degree. These burns may cause the skin to be red and slightly swollen. These burns do not blister or scar. Second degree. These burns are very painful and cause the skin to be very red. The skin may also swell, leak fluid, look shiny, and develop blisters. Third degree. These burns cause permanent damage. They either turn the skin white or black and make it look charred, dry, and leathery. These burns may not be painful due to damage to the nerve endings. Treatment for your burn will depend on the type of burn you have. Taking care of your burn properly can help to prevent pain and infection. It can also help the burn heal more quickly. How to care for a first-degree burn Right after a burn: Rinse or soak the burn under cool water for 5 minutes or more. Do not put ice on your burn. This can cause more damage. Apply a cool, clean, wet cloth (cool compress) to your skin. This may help with pain. Put lotion or gel with aloe vera on your skin. This may help soothe the burn. Caring for the burn Follow instructions from your health care provider about cleaning and caring for the burn. This may include: Using mild soap and water to clean the area. Using a clean cloth to pat the burned area dry after cleaning it. Do not rub or scrub the burn. Applying lotion or gel with aloe vera to your skin. How to care for a second-degree burn Right after a burn: Rinse or soak the burn under cool water. Do this for 5 to 10 minutes. Do not put ice on your burn. This can cause more damage. Remove any jewelry near the burned area. Lightly cover the burn with a clean cloth. Caring for the burn Raise (elevate) the injured area above the level of your heart while sitting or lying down. Follow instructions from your health care provider about  cleaning and caring for the burn. This may include: Cleaning or rinsing out (irrigating) the burned area. Putting a cream or ointment on the burn. Placing a germ-free (sterile) dressing over the burn. A dressing is a material that is placed over a burn to help it heal. How to care for a third-degree burn Right after a burn: Lightly cover the burn with a clean, dry cloth. Seek treatment right away if you have this kind of burn. You may: Require admission to the hospital. Be treated with surgery to remove damaged tissue or to place a skin graft to cover the damaged area. Be given IV fluids to keep you hydrated. Caring for the burn Follow instructions from your health care provider about cleaning and caring for the burn. This may include: Cleaning or rinsing out (irrigating) the burned area. Putting a cream or ointment on the burn. Placing a sterile dressing in the burned area (packing). Placing a sterile dressing over the burn. Other instructions Elevate the injured area above the level of your heart while sitting or lying down. Wear splints or immobilizers as instructed by your health care provider. Rest as told by your health care provider. Do not participate in sports or other physical activities until your health care provider approves. How to prevent infection when caring for a burn  Take these steps to prevent infection: Wash your hands with   soap and water for at least 20 seconds before and after burn care. If soap and water are not available, use hand sanitizer. Wear clean or sterile gloves as directed by your health care provider. Do not put butter, oil, toothpaste, or other home remedies on the burn. Do not scratch or pick at the burn. Do not break any blisters. Do not peel the skin. Do not rub your burn, even when you are cleaning it. Check your burn every day for these signs of infection: More redness, swelling, or pain. Warmth. Pus or a bad smell. Red streaks around the  burn. Follow these instructions at home  Medicines Take over-the-counter and prescription medicines only as told by your health care provider. If you were prescribed an antibiotic medicine, take or apply it as told by your health care provider. Do not stop using the antibiotic even if your condition improves. Your health care provider may recommend taking over-the-counter or prescription pain medicine before changing your dressing. General instructions Protect your burn from the sun. Drink enough fluid to keep your urine pale yellow. Do not use any products that contain nicotine or tobacco, such as cigarettes, e-cigarettes, and chewing tobacco. These can delay healing. If you need help quitting, ask your health care provider. Keep all follow-up visits as told by your health care provider. This is important. Contact a health care provider if: Your condition does not improve. Your condition gets worse. You have a fever or chills. Your burn feels warm to the touch. You have more redness, swelling, or pain at the site of the burn. Your burn changes in appearance or develops black or red spots. You have pain that is not controlled with medicine. Get help right away if you have: More fluid, blood, or pus coming from your burn. Red streaks near the burn. Severe pain. Summary There are three types of burns. They are first degree, second degree, and third degree. The most severe type of burn is a third-degree burn which must be treated right away. Treatment for your burn will depend on the type of burn you have. Do not put butter, oil, toothpaste, or other home remedies on the burn. This can cause more damage to the tissue. Follow instructions from your health care provider about how to clean and take care of your burn. This information is not intended to replace advice given to you by your health care provider. Make sure you discuss any questions you have with your health care  provider. Document Revised: 06/24/2019 Document Reviewed: 06/24/2019 Elsevier Patient Education  2023 [employer] Inc[employer]  

## 2022-06-12 ENCOUNTER — Other Ambulatory Visit: Payer: Self-pay | Admitting: Nurse Practitioner

## 2022-06-14 ENCOUNTER — Ambulatory Visit: Payer: BC Managed Care – PPO | Admitting: Physical Therapy

## 2022-06-14 DIAGNOSIS — M79672 Pain in left foot: Secondary | ICD-10-CM | POA: Diagnosis not present

## 2022-06-14 DIAGNOSIS — R262 Difficulty in walking, not elsewhere classified: Secondary | ICD-10-CM
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
## 2022-06-16 NOTE — Patient Instructions (Signed)
Below is our plan:  We will continue gabapentin 100mg  up to three times daily, verapamil 80mg  twice daily and topiramate 50mg  at bedtime. Continue Nurtec 75mg  daily as needed for abortive therapy.   Please make sure you are staying well hydrated. I recommend 50-60 ounces daily. Well balanced diet and regular exercise encouraged. Consistent sleep schedule with 6-8 hours recommended.   Please continue follow up with care team as directed.   Follow up with me in 1 year   You may receive a survey regarding today's visit. I encourage you to leave honest feed back as I do use this information to improve patient care. Thank you for seeing me today!
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
## 2022-06-20 ENCOUNTER — Ambulatory Visit: Payer: BC Managed Care – PPO | Admitting: Family Medicine

## 2022-06-20 ENCOUNTER — Encounter: Payer: Self-pay | Admitting: Family Medicine

## 2022-06-20 VITALS — BP 125/84 | HR 91 | Ht 67.0 in | Wt 211.5 lb

## 2022-06-20 DIAGNOSIS — G43009 Migraine without aura, not intractable, without status migrainosus: Secondary | ICD-10-CM

## 2022-06-20 MED ORDER — TOPIRAMATE 50 MG PO TABS: 50.0000 mg | ORAL_TABLET | Freq: Every day | ORAL | 3 refills | Status: DC

## 2022-06-20 MED ORDER — VERAPAMIL HCL 80 MG PO TABS
80.0000 mg | ORAL_TABLET | Freq: Two times a day (BID) | ORAL | 3 refills | Status: DC
Start: 2022-06-20 — End: 2023-05-03

## 2022-06-20 MED ORDER — NURTEC 75 MG PO TBDP: 75.0000 mg | ORAL_TABLET | Freq: Every day | ORAL | 11 refills | Status: DC | PRN

## 2022-06-21 ENCOUNTER — Ambulatory Visit: Payer: BC Managed Care – PPO | Attending: Podiatry | Admitting: Physical Therapy

## 2022-06-21 DIAGNOSIS — M79672 Pain in left foot: Secondary | ICD-10-CM | POA: Diagnosis not present

## 2022-06-21 DIAGNOSIS — R262 Difficulty in walking, not elsewhere classified: Secondary | ICD-10-CM | POA: Diagnosis present
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
## 2022-06-28 ENCOUNTER — Ambulatory Visit: Payer: BC Managed Care – PPO | Admitting: Physical Therapy

## 2022-06-28 ENCOUNTER — Ambulatory Visit: Payer: BC Managed Care – PPO | Admitting: Podiatry

## 2022-06-28 ENCOUNTER — Encounter: Payer: Self-pay | Admitting: Podiatry

## 2022-06-28 DIAGNOSIS — M722 Plantar fascial fibromatosis: Secondary | ICD-10-CM

## 2022-06-28 DIAGNOSIS — S93692D Other sprain of left foot, subsequent encounter: Secondary | ICD-10-CM

## 2022-06-28 DIAGNOSIS — Z9889 Other specified postprocedural states: Secondary | ICD-10-CM

## 2022-06-28 MED ORDER — TRIAMCINOLONE ACETONIDE 40 MG/ML IJ SUSP
20.0000 mg | Freq: Once | INTRAMUSCULAR | Status: AC
  Administered 2022-06-28: 20 mg

## 2022-06-29 NOTE — Progress Notes (Signed)
She presents today for postop visit date of surgery 03/03/2022 EPF with PRP and removal internal fixation second metatarsal phalangeal joint.  She states that physical therapy really helped her out as far as the right foot goes however she still having some heel pain left.  She states that she utilized ice and extra get frostbite to her left heel.  Had a blister the skin sloughed and now her skin is tender.  Objective: Vital signs are stable she alert and oriented x3.  She has a palpable mass on the plantar aspect of the left heel which is either a piece and retracted plantar fascia or more likely a bursitis.  The skin is intact I see no breakdown.  Right foot demonstrates great range of motion of the metatarsophalangeal joints and nontender.  All scar tissue has released at this point the skin.  Assessment: Well-healing surgical foot with good range of motion status post physical therapy right foot.  And then she has bursitis.  Plan: I injected the bursa today and encouraged her to continue the physical therapy that she was talked.  I will follow-up with her in about 6 weeks

## 2022-07-04 ENCOUNTER — Encounter: Payer: Self-pay | Admitting: Nurse Practitioner

## 2022-07-10 ENCOUNTER — Encounter: Payer: Self-pay | Admitting: Nurse Practitioner

## 2022-07-10 ENCOUNTER — Ambulatory Visit: Payer: BC Managed Care – PPO | Admitting: Nurse Practitioner

## 2022-07-10 ENCOUNTER — Other Ambulatory Visit: Payer: Self-pay

## 2022-07-10 VITALS — BP 128/76 | HR 98 | Temp 97.7°F | Resp 18 | Ht 67.0 in | Wt 210.6 lb

## 2022-07-10 DIAGNOSIS — F331 Major depressive disorder, recurrent, moderate: Secondary | ICD-10-CM

## 2022-07-10 DIAGNOSIS — Z23 Encounter for immunization: Secondary | ICD-10-CM

## 2022-07-10 DIAGNOSIS — E669 Obesity, unspecified: Secondary | ICD-10-CM

## 2022-07-10 DIAGNOSIS — R110 Nausea: Secondary | ICD-10-CM | POA: Diagnosis not present

## 2022-07-10 DIAGNOSIS — R7303 Prediabetes: Secondary | ICD-10-CM

## 2022-07-10 DIAGNOSIS — Z6832 Body mass index (BMI) 32.0-32.9, adult: Secondary | ICD-10-CM

## 2022-07-10 LAB — POCT GLYCOSYLATED HEMOGLOBIN (HGB A1C): Hemoglobin A1C: 5.4 % (ref 4.0–5.6)

## 2022-07-10 MED ORDER — SEMAGLUTIDE-WEIGHT MANAGEMENT 0.25 MG/0.5ML ~~LOC~~ SOAJ: 0.2500 mg | SUBCUTANEOUS | 0 refills | Status: AC

## 2022-07-10 MED ORDER — CITALOPRAM HYDROBROMIDE 20 MG PO TABS: 20.0000 mg | ORAL_TABLET | Freq: Every day | ORAL | 0 refills | Status: DC

## 2022-07-10 NOTE — Assessment & Plan Note (Signed)
Increased Celexa 20 mg daily.  Patient has discussed that she has increased stress.

## 2022-07-10 NOTE — Assessment & Plan Note (Signed)
Given prescription for Wegovy 0.25 mg weekly.  Discussed administration and side effects with patient.  Patient verbalized understanding.

## 2022-07-10 NOTE — Progress Notes (Signed)
BP 128/76   Pulse 98   Temp 97.7 F (36.5 C) (Temporal)   Resp 18   Ht 5\' 7"  (1.702 m)   Wt 210 lb 9.6 oz (95.5 kg)   SpO2 98%   BMI 32.98 kg/m    Subjective:    Patient ID: Brittney Johns, female    DOB: 08/27/1971, 51 y.o.   MRN: [id number]  HPI: Brittney Johns is a 51 y.o. female  Chief Complaint  Patient presents with   Obesity   PreDiabetic   Obesity: Patient's current weight is 210 lbs, with a BMI of 32.98.  Says her highest weight was 220 lbs.  She has tried saxenda and nutrition  in the past. She would like to try wegovy.  She denies any personal history of pancreatitis or family history of thyroid cancer.  Discussed administration and side effects of Wegovy.  Patient verbalized understanding.  Prescription given to patient for Brittney Johns.  Diabetes: Her last A1c was 5.8 on 12/27/2021.  She denies any polyuria, polydipsia or polyphasia.  Her A1C today is 5.4.  No changes.   Depression/stress/nauesa: Patient states that her mom's health has deteriorated.  She says she has felt a lot more stress lately.  Patient states she is also had a lot of nausea.  She does have a history of IBS.  Discussed with patient that stress can increase IBS symptoms.  She also states that she has noticed that she feels like she has little more acid reflux.  Discussed with patient that she should trial omeprazole or Pepcid over-the-counter.  Patient also reports that she does have a prescriptions for Zofran.  We will also increase patient's Celexa.  Discussed with patient if no improvement in symptoms recommend following up with GI.  Patient does have a family history of colorectal cancer.  She is due for colonoscopy next year.  Patient agreeable to plan at this time.     07/10/2022    3:10 PM 04/20/2022    2:12 PM 03/23/2022    1:26 PM 09/22/2021    3:32 PM 09/06/2021   11:58 AM  Depression screen PHQ 2/9  Decreased Interest 0 1 2 0 0  Down, Depressed, Hopeless 1 1 2  0 0  PHQ - 2 Score 1 2 4  0 0   Altered sleeping 1 0 0 0   Tired, decreased energy 1 1 1  0   Change in appetite 1 0 0 0   Feeling bad or failure about yourself  0 0 0 0   Trouble concentrating 0 0 1 0   Moving slowly or fidgety/restless 0 0 0 0   Suicidal thoughts 0 0 0 0   PHQ-9 Score 4 3 6  0   Difficult doing work/chores Somewhat difficult Not difficult at all Not difficult at all Not difficult at all        07/10/2022    3:21 PM 04/20/2022    2:20 PM 03/23/2022    1:29 PM 11/04/2018   10:21 AM  GAD 7 : Generalized Anxiety Score  Nervous, Anxious, on Edge 0 0 0 0  Control/stop worrying 0 0 0 0  Worry too much - different things 1 0 1 0  Trouble relaxing 0 0 0 0  Restless 0 0 0 0  Easily annoyed or irritable 0 0 0 0  Afraid - awful might happen 0 0 0 0  Total GAD 7 Score 1 0 1 0  Anxiety Difficulty Somewhat difficult  Somewhat difficult Not  difficult at all      Relevant past medical, surgical, family and social history reviewed and updated as indicated. Interim medical history since our last visit reviewed. Allergies and medications reviewed and updated.  Review of Systems  Constitutional: Negative for fever or weight change.  Respiratory: Negative for cough and shortness of breath.   Cardiovascular: Negative for chest pain or palpitations.  Gastrointestinal: Negative for abdominal pain, no bowel changes.  Positive for nausea Musculoskeletal: Negative for gait problem or joint swelling.  Skin: Negative for rash.  Neurological: Negative for dizziness or headache.  No other specific complaints in a complete review of systems (except as listed in HPI above).      Objective:    BP 128/76   Pulse 98   Temp 97.7 F (36.5 C) (Temporal)   Resp 18   Ht 5\' 7"  (1.702 m)   Wt 210 lb 9.6 oz (95.5 kg)   SpO2 98%   BMI 32.98 kg/m   Wt Readings from Last 3 Encounters:  07/10/22 210 lb 9.6 oz (95.5 kg)  06/20/22 211 lb 8 oz (95.9 kg)  04/20/22 208 lb 11.2 oz (94.7 kg)    Physical Exam  Constitutional:  Patient appears well-developed and well-nourished. Obese  No distress.  HEENT: head atraumatic, normocephalic, pupils equal and reactive to light,  neck supple Cardiovascular: Normal rate, regular rhythm and normal heart sounds.  No murmur heard. No BLE edema. Pulmonary/Chest: Effort normal and breath sounds normal. No respiratory distress. Abdominal: Soft.  There is no tenderness. Psychiatric: Patient has a normal mood and affect. behavior is normal. Judgment and thought content normal.  Results for orders placed or performed in visit on 07/10/22  POCT HgB A1C  Result Value Ref Range   Hemoglobin A1C 5.4 4.0 - 5.6 %   HbA1c POC (<> result, manual entry)     HbA1c, POC (prediabetic range)     HbA1c, POC (controlled diabetic range)        Assessment & Plan:   Problem List Items Addressed This Visit       Other   Class 1 obesity with body mass index (BMI) of 32.0 to 32.9 in adult - Primary    Given prescription for Wegovy 0.25 mg weekly.  Discussed administration and side effects with patient.  Patient verbalized understanding.      Relevant Medications   Semaglutide-Weight Management 0.25 MG/0.5ML SOAJ   Prediabetes   Relevant Orders   POCT HgB A1C (Completed)   Moderate episode of recurrent major depressive disorder ([facility])    Increased Celexa 20 mg daily.  Patient has discussed that she has increased stress.        Relevant Medications   citalopram (CELEXA) 20 MG tablet   Other Visit Diagnoses     Need for influenza vaccination       Relevant Orders   Flu Vaccine QUAD 6+ mos PF IM (Fluarix Quad PF) (Completed)   Nausea       She reports increased nausea and some acid reflux.  Recommend patient take Pepcid or over-the-counter omeprazole.  If no improvement GI        Follow up plan: Return in about 4 weeks (around 08/07/2022) for follow up.

## 2022-07-12 ENCOUNTER — Ambulatory Visit: Payer: BC Managed Care – PPO | Admitting: Physical Therapy

## 2022-07-21 ENCOUNTER — Encounter: Payer: Self-pay | Admitting: Nurse Practitioner

## 2022-08-02 ENCOUNTER — Ambulatory Visit: Payer: BC Managed Care – PPO | Admitting: Podiatry

## 2022-08-08 ENCOUNTER — Ambulatory Visit: Payer: BC Managed Care – PPO | Admitting: Nurse Practitioner

## 2022-08-28 ENCOUNTER — Telehealth: Payer: BC Managed Care – PPO | Admitting: Physician Assistant

## 2022-08-28 DIAGNOSIS — W19XXXA Unspecified fall, initial encounter: Secondary | ICD-10-CM | POA: Diagnosis not present

## 2022-08-28 DIAGNOSIS — Y92009 Unspecified place in unspecified non-institutional (private) residence as the place of occurrence of the external cause: Secondary | ICD-10-CM

## 2022-08-28 DIAGNOSIS — F0781 Postconcussional syndrome: Secondary | ICD-10-CM | POA: Diagnosis not present

## 2022-08-28 DIAGNOSIS — R110 Nausea: Secondary | ICD-10-CM

## 2022-08-28 DIAGNOSIS — R55 Syncope and collapse: Secondary | ICD-10-CM | POA: Diagnosis not present

## 2022-08-28 MED ORDER — ONDANSETRON 4 MG PO TBDP: 4.0000 mg | ORAL_TABLET | Freq: Three times a day (TID) | ORAL | 0 refills | Status: DC | PRN

## 2022-08-28 NOTE — Patient Instructions (Addendum)
[name], thank you for joining [name], PA-C for today's virtual visit.  While this provider is not your primary care provider (PCP), if your PCP is located in our provider database this encounter information will be shared with them immediately following your visit.   A [facility] MyChart account gives you access to today's visit and all your visits, tests, and labs performed at [facility] " click here if you don't have a [facility] MyChart account or go to [email or web address]  Consent: (Patient) Brittney Johns provided verbal consent for this virtual visit at the beginning of the encounter.  Current Medications:  Current Outpatient Medications:    albuterol (VENTOLIN HFA) 108 (90 Base) MCG/ACT inhaler, Inhale 2 puffs into the lungs every 6 (six) hours as needed for wheezing or shortness of breath. (Patient not taking: Reported on 07/10/2022), Disp: 18 g, Rfl: 1   Biotin 5000 MCG CAPS, Take by mouth., Disp: , Rfl:    citalopram (CELEXA) 20 MG tablet, Take 1 tablet (20 mg total) by mouth daily., Disp: 30 tablet, Rfl: 0   cyclobenzaprine (FLEXERIL) 10 MG tablet, Take 1 tablet (10 mg total) by mouth as needed for muscle spasms. (Patient not taking: Reported on 07/10/2022), Disp: 30 tablet, Rfl: 0   estradiol (ESTRACE) 1 MG tablet, TAKE 1 AND 1/2 TABLETS(1.5 MG) BY MOUTH DAILY, Disp: 135 tablet, Rfl: 1   fluticasone (FLONASE) 50 MCG/ACT nasal spray, Place 2 sprays into both nostrils daily., Disp: 16 g, Rfl: 6   gabapentin (NEURONTIN) 100 MG capsule, Take 1 capsule (100 mg total) by mouth 3 (three) times daily., Disp: 270 capsule, Rfl: 2   imipramine (TOFRANIL) 25 MG tablet, Take 25 mg by mouth daily. , Disp: , Rfl:    meloxicam (MOBIC) 15 MG tablet, Take 1 tablet (15 mg total) by mouth daily., Disp: 30 tablet, Rfl: 3   methocarbamol (ROBAXIN) 750 MG tablet, Take 750 mg by mouth 3 (three) times daily., Disp: , Rfl:    Multiple Vitamins-Minerals  (MULTIVITAMIN WITH MINERALS) tablet, Take 1 tablet by mouth daily., Disp: , Rfl:    Rimegepant Sulfate (NURTEC) 75 MG TBDP, Take 75 mg by mouth daily as needed (take for abortive therapy of migraine, no more than 1 tablet in 24 hours or 10 per month)., Disp: 8 tablet, Rfl: 11   topiramate (TOPAMAX) 50 MG tablet, Take 1 tablet (50 mg total) by mouth daily. TAKE 1 TABLET BY MOUTH DAILY., Disp: 90 tablet, Rfl: 3   traMADol (ULTRAM) 50 MG tablet, tramadol 50 mg tablet  TAKE 1 TABLET BY MOUTH TWICE DAILY AS NEEDED, Disp: , Rfl:    verapamil (CALAN) 80 MG tablet, Take 1 tablet (80 mg total) by mouth 2 (two) times daily. TAKE 1 TABLET (80 MG TOTAL) BY MOUTH 2 (TWO) TIMES DAILY., Disp: 180 tablet, Rfl: 3   Medications ordered in this encounter:  No orders of the defined types were placed in this encounter.    *If you need refills on other medications prior to your next appointment, please contact your pharmacy*  Follow-Up: Call back or seek an in-person evaluation if the symptoms worsen or if the condition fails to improve as anticipated.  [facility] [phone]  Other Instructions Please keep well-hydrated and try to get plenty of rest. Stick to a bland diet (see below). Avoid NSAIDs like ibuprofen, meloxicam, Advil for now as I am afraid it is causing inflammation in your stomach which is worsening the nausea. Stick  to Tylenol for now until the nausea resolves. The Zofran is to use as directed for nausea so that we can keep you well-hydrated. As discussed, this is one of the few times where I prescribe naps.  It is okay to rest periodically throughout the day. Avoid mentally taxing activities when possible and avoid bluelight from electronic devices. Want you to stay home from work tomorrow. If you feel like you are still unable to return after that, we want you to discuss with your primary care provider who can provide an extended if needed as well as further work  accommodations. If you note anything new or acutely worsening, or any recurrence of the "episode" that you had earlier, I want you to be evaluated in person ASAP, just to be extra cautious.  Bland Diet A bland diet may consist of soft foods or foods that are not high in fat or are not greasy, acidic, or spicy. Avoiding certain foods may cause less irritation to your mouth, throat, stomach, or gastrointestinal tract. Avoiding certain foods may make you feel better. Everyone's tolerances are different. A bland diet should be based on what you can tolerate and what may cause discomfort. What is my plan? Your health care provider or dietitian may recommend specific changes to your diet to treat your symptoms. These changes may include: Eating small meals frequently. Cooking food until it is soft enough to chew easily. Taking the time to chew your food thoroughly, so it is easy to swallow and digest. Avoiding foods that cause you discomfort. These may include spicy food, fried food, greasy foods, hard-to-chew foods, or citrus fruits and juices. Drinking slowly. What are tips for following this plan? Reading food labels To reduce fiber intake, look for food labels that say "whole," such as whole wheat or whole grain. Shopping Avoid food items that may have nuts or seeds. Avoid vegetables that may make you gassy or have a tough texture, such as broccoli, cauliflower, or corn. Cooking Cook foods thoroughly so they have a soft texture. Meal planning Make sure you include foods from all food groups to eat a balanced diet. Eat a variety of types of foods. Eat foods and drink beverages that do not cause you discomfort. These may include soups and broths with cooked meats, pasta, and vegetables. Lifestyle Sit up after meals, avoid tight clothing, and take time to eat and chew your food slowly. Ask your health care provider whether you should take dietary supplements. General information Mildly season  your foods. Some seasonings, such as cayenne pepper, vinegar, or hot sauce, may cause irritation. The foods, beverages, or seasonings to avoid should be based on individual tolerance. What foods should I eat? Fruits Canned or cooked fruit such as peaches, pears, or applesauce. Bananas. Vegetables Well-cooked vegetables. Canned or cooked vegetables such as carrots, green beans, beets, or spinach. Mashed or boiled potatoes. Grains  Hot cereals, such as cream of wheat and processed oatmeal. Rice. Bread, crackers, pasta, or tortillas made from refined white flour. Meats and other proteins  Eggs. Creamy peanut butter or other nut butters. Lean, well-cooked tender meats, such as beef, pork, chicken, or fish. Dairy Low-fat dairy products such as milk, cottage cheese, or yogurt. Beverages  Water. Herbal tea. Apple juice. Fats and oils Mild salad dressings. Canola or olive oil. Sweets and desserts Low-fat pudding, custard, or ice cream. Fruit gelatin. The items listed above may not be a complete list of foods and beverages you can eat. Contact a dietitian for  more information. What foods should I avoid? Fruits Citrus fruits, such as oranges and grapefruit. Fruits with a stringy texture. Fruits that have lots of seeds, such as kiwi or strawberries. Dried fruits. Vegetables Raw, uncooked vegetables. Salads. Grains Whole grain breads, muffins, and cereals. Meats and other proteins Tough, fibrous meats. Highly seasoned meat such as corned beef, smoked meats, or fish. Processed high-fat meats such as brats, hot dogs, or sausage. Dairy Full-fat dairy foods such as ice cream and cheese. Beverages Caffeinated drinks. Alcohol. Seasonings and condiments Strongly flavored seasonings or condiments. Hot sauce. Salsa. Other foods Spicy foods. Fried or greasy foods. Sour foods, such as pickled or fermented foods like sauerkraut. Foods high in fiber. The items listed above may not be a complete list  of foods and beverages you should avoid. Contact a dietitian for more information. Summary A bland diet should be based on individual tolerance. It may consist of foods that are soft textured and do not have a lot of fat, fiber, acid, or seasonings. A bland diet may be recommended because avoiding certain foods, beverages, or spices may make you feel better. This information is not intended to replace advice given to you by your health care provider. Make sure you discuss any questions you have with your health care provider. Document Revised: 07/25/2021 Document Reviewed: 07/25/2021 Elsevier Patient Education  2023 Elsevier Inc.    If you have been instructed to have an in-person evaluation today at a local Urgent Care facility, please use the link below. It will take you to a list of all of our available [facility], including address, phone number and hours of operation. Please do not delay care.  [facility]  If you or a family member do not have a primary care provider, use the link below to schedule a visit and establish care. When you choose a [facility] primary care physician or advanced practice provider, you gain a long-term partner in health. Find a Primary Care Provider  Learn more about [facility] in-office and virtual care options: [facility] - Get Care Now

## 2022-08-28 NOTE — Progress Notes (Signed)
Virtual Visit Consent   Brittney Johns, you are scheduled for a virtual visit with a [facility] provider today. Just as with appointments in the office, your consent must be obtained to participate. Your consent will be active for this visit and any virtual visit you may have with one of our providers in the next 365 days. If you have a MyChart account, a copy of this consent can be sent to you electronically.  As this is a virtual visit, video technology does not allow for your provider to perform a traditional examination. This may limit your provider's ability to fully assess your condition. If your provider identifies any concerns that need to be evaluated in person or the need to arrange testing (such as labs, EKG, etc.), we will make arrangements to do so. Although advances in technology are sophisticated, we cannot ensure that it will always work on either your end or our end. If the connection with a video visit is poor, the visit may have to be switched to a telephone visit. With either a video or telephone visit, we are not always able to ensure that we have a secure connection.  By engaging in this virtual visit, you consent to the provision of healthcare and authorize for your insurance to be billed (if applicable) for the services provided during this visit. Depending on your insurance coverage, you may receive a charge related to this service.  I need to obtain your verbal consent now. Are you willing to proceed with your visit today? Brittney Johns has provided verbal consent on 08/28/2022 for a virtual visit (video or telephone). [name], [place]  Date: 08/28/2022 7:21 PM  Virtual Visit via Video Note   I, [name], connected with  Brittney Johns  ([id number], [birth year]) on 08/28/22 at  6:00 PM EST by a video-enabled telemedicine application and verified that I am speaking with the correct person using two identifiers.  Location: Patient: Virtual Visit  Location Patient: Home Provider: Virtual Visit Location Provider: Home Office   I discussed the limitations of evaluation and management by telemedicine and the availability of in person appointments. The patient expressed understanding and agreed to proceed.    History of Present Illness: Brittney Johns is a 51 y.o. who identifies as a female who was assigned female at birth, and is being seen today for some lingering symptoms after a fall sustained at home yesterday. Notes she was walking into her garage from the house and she missed the landing step, falling forward onto her knees. Was carrying a good amount of items in her arms so could not brace herself, falling forward, face first to the concrete, hitting the items in her arms. Denies loss of consciousness but had to call for her husband to help get her up as her left arm was trapped under her due to how she was holding the items. Pretty immediately noted her knees were scraped up and sore bilaterally. Arms without issue. Was a bit stiff but able to go about her activities. Later noting some headache. The next day at work she was still noting some mild frontal headache with feeling "off balance" on occasion. Had some associated nausea so was not eating or drinking much. 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 This resolved within a couple of minutes, without recurrence but she called her husband to bring her home. Has been trying to  hydrate. Still has not eaten much due to nausea. Using OTC Tylenol and Meloxicam for headache. Denies vision changes. Photophobia or phonophobia. Denies neck pain, tension or decreased ROM.   HPI: HPI  Problems:  Patient Active Problem List   Diagnosis Date Noted   Moderate episode of recurrent major depressive disorder (HCC) 03/23/2022   Prediabetes 11/12/2020   Allergy 08/13/2020   Class 1 obesity with body mass index (BMI) of  32.0 to 32.9 in adult 09/20/2018   Family history of colon cancer requiring screening colonoscopy    Status post vaginal hysterectomy 04/11/2016   Anxiety 04/11/2016   Allergic rhinitis 12/31/2015   Constipation 12/23/2015   Surgical menopause 04/07/2015   Chronic right hip pain 04/05/2015   Endometriosis    IBS (irritable bowel syndrome)    Migraine without aura 10/17/2013   Headache 10/17/2013   FOM (frequency of micturition) 04/24/2013   Incomplete bladder emptying 04/24/2013   Chronic interstitial cystitis 04/24/2013    Allergies:  Allergies  Allergen Reactions   Imitrex [Sumatriptan] Anaphylaxis   Medications:  Current Outpatient Medications:    ondansetron (ZOFRAN-ODT) 4 MG disintegrating tablet, Take 1 tablet (4 mg total) by mouth every 8 (eight) hours as needed for nausea or vomiting., Disp: 20 tablet, Rfl: 0   Biotin 5000 MCG CAPS, Take by mouth., Disp: , Rfl:    citalopram (CELEXA) 20 MG tablet, Take 1 tablet (20 mg total) by mouth daily., Disp: 30 tablet, Rfl: 0   estradiol (ESTRACE) 1 MG tablet, TAKE 1 AND 1/2 TABLETS(1.5 MG) BY MOUTH DAILY, Disp: 135 tablet, Rfl: 1   fluticasone (FLONASE) 50 MCG/ACT nasal spray, Place 2 sprays into both nostrils daily., Disp: 16 g, Rfl: 6   gabapentin (NEURONTIN) 100 MG capsule, Take 1 capsule (100 mg total) by mouth 3 (three) times daily., Disp: 270 capsule, Rfl: 2   imipramine (TOFRANIL) 25 MG tablet, Take 25 mg by mouth daily. , Disp: , Rfl:    meloxicam (MOBIC) 15 MG tablet, Take 1 tablet (15 mg total) by mouth daily., Disp: 30 tablet, Rfl: 3   methocarbamol (ROBAXIN) 750 MG tablet, Take 750 mg by mouth 3 (three) times daily., Disp: , Rfl:    Multiple Vitamins-Minerals (MULTIVITAMIN WITH MINERALS) tablet, Take 1 tablet by mouth daily., Disp: , Rfl:    Rimegepant Sulfate (NURTEC) 75 MG TBDP, Take 75 mg by mouth daily as needed (take for abortive therapy of migraine, no more than 1 tablet in 24 hours or 10 per month)., Disp: 8 tablet,  Rfl: 11   topiramate (TOPAMAX) 50 MG tablet, Take 1 tablet (50 mg total) by mouth daily. TAKE 1 TABLET BY MOUTH DAILY., Disp: 90 tablet, Rfl: 3   traMADol (ULTRAM) 50 MG tablet, tramadol 50 mg tablet  TAKE 1 TABLET BY MOUTH TWICE DAILY AS NEEDED, Disp: , Rfl:    verapamil (CALAN) 80 MG tablet, Take 1 tablet (80 mg total) by mouth 2 (two) times daily. TAKE 1 TABLET (80 MG TOTAL) BY MOUTH 2 (TWO) TIMES DAILY., Disp: 180 tablet, Rfl: 3  Observations/Objective: Patient is well-developed, well-nourished in no acute distress.  Resting comfortably at home.  Head is normocephalic, atraumatic.  No labored breathing. Speech is clear and coherent with logical content.  Patient is alert and oriented at baseline.   Assessment and Plan: 1. Fall at home, initial encounter  2. Postconcussive syndrome  3. Nausea  4. Vasovagal episode - ondansetron (ZOFRAN-ODT) 4 MG disintegrating tablet; Take 1 tablet (4 mg total) by mouth every  8 (eight) hours as needed for nausea or vomiting.  Dispense: 20 tablet; Refill: 0  > 36 hours from fall. Is ambulating without difficulty. Seems some lingering symptoms from likely mild concussion including nausea, mental fatigue and headache. This coupled with lack of fluid intake and exertion seemed to induce a vasovagal episode that has completely resolved without residual symptoms. The nausea is likely exacerbated by her use of NSAIDs as well. Will have her hydrate, rest, keep bland diet. Zofran per orders. Avoid NSAIDs and use Tylenol OTC if needed. Recommend mental rest including naps, limitation of mentally taxing activities and limited blue-light exposure. Discussed increased soreness of neck and shoulders and legs is a possibility over next couple of days. Supportive measures for this reviewed. Work note provided. Want you to have follow-up with her PCP. Strict UC/ER precautions discussed.   Follow Up Instructions: I discussed the assessment and treatment plan with the  patient. The patient was provided an opportunity to ask questions and all were answered. The patient agreed with the plan and demonstrated an understanding of the instructions.  A copy of instructions were sent to the patient via MyChart unless otherwise noted below.    The patient was advised to call back or seek an in-person evaluation if the symptoms worsen or if the condition fails to improve as anticipated.  Time:  I spent 10 minutes with the patient via telehealth technology discussing the above problems/concerns.    [name], PA-C

## 2022-09-25 ENCOUNTER — Encounter: Payer: Self-pay | Admitting: Nurse Practitioner

## 2022-09-26 ENCOUNTER — Other Ambulatory Visit: Payer: Self-pay | Admitting: Nurse Practitioner

## 2022-09-26 DIAGNOSIS — E669 Obesity, unspecified: Secondary | ICD-10-CM

## 2022-09-26 MED ORDER — ZEPBOUND 2.5 MG/0.5ML ~~LOC~~ SOAJ: 2.5000 mg | SUBCUTANEOUS | 0 refills | Status: DC

## 2022-09-28 ENCOUNTER — Encounter: Payer: Self-pay | Admitting: Nurse Practitioner

## 2022-10-02 DIAGNOSIS — M47817 Spondylosis without myelopathy or radiculopathy, lumbosacral region: Secondary | ICD-10-CM | POA: Insufficient documentation

## 2022-10-06 ENCOUNTER — Ambulatory Visit: Payer: Self-pay

## 2022-10-06 NOTE — Telephone Encounter (Signed)
Appt sch'd with Junie Panning for Monday

## 2022-10-06 NOTE — Telephone Encounter (Signed)
     Chief Complaint: Left pain, numbness. Asking to be worked in Monday. Symptoms: Above Frequency: 1 week ago Pertinent Negatives: Patient denies  Disposition: [] ED /[] Urgent Care (no appt availability in office) / [] Appointment(In office/virtual)/ []  [facility]/ [] Home Care/ [] Refused Recommended Disposition /[]  Mobile Bus/ [x]  Follow-up with PCP Additional Notes: Please advise pt.  Answer Assessment - Initial Assessment Questions 1. ONSET: "When did the pain start?"      1 week ago 2. LOCATION: "Where is the pain located?"      Left leg 3. PAIN: "How bad is the pain?"    (Scale 1-10; or mild, moderate, severe)   -  MILD (1-3): doesn't interfere with normal activities    -  MODERATE (4-7): interferes with normal activities (e.g., work or school) or awakens from sleep, limping    -  SEVERE (8-10): excruciating pain, unable to do any normal activities, unable to walk     10 Now 4. WORK OR EXERCISE: "Has there been any recent work or exercise that involved this part of the body?"      No 5. CAUSE: "What do you think is causing the leg pain?"     Unsure 6. OTHER SYMPTOMS: "Do you have any other symptoms?" (e.g., chest pain, back pain, breathing difficulty, swelling, rash, fever, numbness, weakness)     Pain 7. PREGNANCY: "Is there any chance you are pregnant?" "When was your last menstrual period?"     No  Protocols used: Leg Pain-A-AH

## 2022-10-09 ENCOUNTER — Ambulatory Visit: Payer: BC Managed Care – PPO | Admitting: Physician Assistant

## 2022-10-10 ENCOUNTER — Telehealth: Payer: Self-pay | Admitting: *Deleted

## 2022-10-10 ENCOUNTER — Encounter: Payer: Self-pay | Admitting: Podiatry

## 2022-10-10 DIAGNOSIS — M79605 Pain in left leg: Secondary | ICD-10-CM

## 2022-10-10 NOTE — Telephone Encounter (Signed)
Referral to [facility](Dr  Trenton Gammon) has been faxed, confirmation received-10/10/22.

## 2022-10-10 NOTE — Telephone Encounter (Signed)
Faxed today to [facility]-Dr [place].

## 2022-10-20 ENCOUNTER — Encounter: Payer: Self-pay | Admitting: Nurse Practitioner

## 2022-10-20 ENCOUNTER — Ambulatory Visit: Payer: BC Managed Care – PPO | Admitting: Nurse Practitioner

## 2022-10-20 VITALS — BP 122/76 | HR 89 | Temp 97.9°F | Resp 16 | Ht 67.0 in | Wt 205.9 lb

## 2022-10-20 DIAGNOSIS — F331 Major depressive disorder, recurrent, moderate: Secondary | ICD-10-CM

## 2022-10-20 DIAGNOSIS — E66811 Obesity, class 1: Secondary | ICD-10-CM

## 2022-10-20 DIAGNOSIS — Z130 Encounter for screening for diseases of the blood and blood-forming organs and certain disorders involving the immune mechanism: Secondary | ICD-10-CM

## 2022-10-20 DIAGNOSIS — E669 Obesity, unspecified: Secondary | ICD-10-CM

## 2022-10-20 DIAGNOSIS — J0110 Acute frontal sinusitis, unspecified: Secondary | ICD-10-CM

## 2022-10-20 DIAGNOSIS — G43009 Migraine without aura, not intractable, without status migrainosus: Secondary | ICD-10-CM | POA: Diagnosis not present

## 2022-10-20 DIAGNOSIS — R7303 Prediabetes: Secondary | ICD-10-CM

## 2022-10-20 DIAGNOSIS — M47817 Spondylosis without myelopathy or radiculopathy, lumbosacral region: Secondary | ICD-10-CM

## 2022-10-20 DIAGNOSIS — R7989 Other specified abnormal findings of blood chemistry: Secondary | ICD-10-CM

## 2022-10-20 DIAGNOSIS — Z6832 Body mass index (BMI) 32.0-32.9, adult: Secondary | ICD-10-CM

## 2022-10-20 DIAGNOSIS — K581 Irritable bowel syndrome with constipation: Secondary | ICD-10-CM

## 2022-10-20 LAB — POCT INFLUENZA A/B
Influenza A, POC: NEGATIVE
Influenza B, POC: NEGATIVE

## 2022-10-20 NOTE — Assessment & Plan Note (Signed)
Checking labs

## 2022-10-20 NOTE — Assessment & Plan Note (Signed)
Stable -no changes  

## 2022-10-20 NOTE — Progress Notes (Signed)
BP 122/76 (BP Location: Right Arm, Patient Position: Sitting, Cuff Size: Normal)   Pulse 89   Temp 97.9 F (36.6 C) (Oral)   Resp 16   Ht 5\' 7"  (1.702 m)   Wt 205 lb 14.4 oz (93.4 kg)   SpO2 100%   BMI 32.25 kg/m    Subjective:    Patient ID: Brittney Johns, female    DOB: Nov 15, 1970, 52 y.o.   MRN: [id number]  HPI: Brittney Johns is a 52 y.o. female  Chief Complaint  Patient presents with   Follow-up   Sinusitis    URI: she says that she has had symptoms for two days, she says the only symptoms she is having is nasal congestion, headache and facial pressure on the left side of her face. She denies any shortness of breath, fever, cough, sore throat. Will test for covid and flu.  Discussed side effects and administration of antiviral treatment. If positive for covid she would like to take paxlovid. Rapid flu negative.   Recommend taking zyrtec, flonase, mucinex, vitamin d, vitamin c, and zinc. Push fluids and get rest.     Abnormal TSH: Patient had an abnormal TSH on 12/27/2021 and it was 0.426.  We redrew it on 01/11/2022 and it was back in normal range.  TSH was 0.57 on 03/23/2022.  Will recheck.  Patient denies any palpitations, constipation, or fatigue.     Prediabetes: Her last A1C was 5.4 on 07/10/2022. She is not currently on medication.  She is aware to decrease sugar and processed foods in her diet. She denies any polydipsia, polyphagia, or polyuria.   Migraine: She sees neurology for her migraines.  She is taking topiramate 50 mg daily, gabapentin 100 mg three times a day and verapamil 80 mg BID. She does take OTC ibuprofen and occasionally uses Nurtec.  Last saw neurology on 06/20/2022. She reports that they have been well controlled.   Lumbar spondylolisthesis with stenosis:  she has been talking to two surgeons about treatment plans. She says that she has numbness from the left knee down.  One surgeon is recommending pt and injections and may need surgery with pins and  rods and another gave her different options. She is weight out her options to decide what she is going to do.   IBS: She says she is doing good with her IBS.  She says she has a BM probably every day or two. She says she is not using the miralax at this time.  She reports no changes.   Depression:patient is currently taking celexa 20 mg daily. Her PHQ9 and GAD scores are negative.  She reports she is doing well.      10/20/2022    3:47 PM 10/20/2022    3:41 PM 07/10/2022    3:10 PM 04/20/2022    2:12 PM 03/23/2022    1:26 PM  Depression screen PHQ 2/9  Decreased Interest 0 0 0 1 2  Down, Depressed, Hopeless 0 0 1 1 2   PHQ - 2 Score 0 0 1 2 4   Altered sleeping 0 0 1 0 0  Tired, decreased energy 0 0 1 1 1   Change in appetite 0 0 1 0 0  Feeling bad or failure about yourself  0 0 0 0 0  Trouble concentrating 0 0 0 0 1  Moving slowly or fidgety/restless 0 0 0 0 0  Suicidal thoughts 0 0 0 0 0  PHQ-9 Score 0 0 4 3 6  Difficult doing work/chores  Not difficult at all Somewhat difficult Not difficult at all Not difficult at all       10/20/2022    3:47 PM 07/10/2022    3:21 PM 04/20/2022    2:20 PM 03/23/2022    1:29 PM  GAD 7 : Generalized Anxiety Score  Nervous, Anxious, on Edge 0 0 0 0  Control/stop worrying 0 0 0 0  Worry too much - different things 0 1 0 1  Trouble relaxing 0 0 0 0  Restless 0 0 0 0  Easily annoyed or irritable 0 0 0 0  Afraid - awful might happen 0 0 0 0  Total GAD 7 Score 0 1 0 1  Anxiety Difficulty  Somewhat difficult  Somewhat difficult    Obesity: her weight today is 205 lbs with a BMI of 32.25. We have tried to get her approved for weight loss medication but have been unsuccessful.  She is trying to eat healthier and drinking water.  [facility]. She is going to gather information and she will let me know if she needs a referral.   Relevant past medical, surgical, family and social history reviewed and updated as indicated. Interim  medical history since our last visit reviewed. Allergies and medications reviewed and updated.  Review of Systems  Constitutional: Negative for fever or weight change.  Respiratory: Negative for cough and shortness of breath.   Cardiovascular: Negative for chest pain or palpitations.  Gastrointestinal: Negative for abdominal pain, no bowel changes.  Musculoskeletal: Negative for gait problem or joint swelling.  Skin: Negative for rash.  Neurological: Negative for dizziness or headache.  No other specific complaints in a complete review of systems (except as listed in HPI above).      Objective:    BP 122/76 (BP Location: Right Arm, Patient Position: Sitting, Cuff Size: Normal)   Pulse 89   Temp 97.9 F (36.6 C) (Oral)   Resp 16   Ht 5\' 7"  (1.702 m)   Wt 205 lb 14.4 oz (93.4 kg)   SpO2 100%   BMI 32.25 kg/m   Wt Readings from Last 3 Encounters:  10/20/22 205 lb 14.4 oz (93.4 kg)  07/10/22 210 lb 9.6 oz (95.5 kg)  06/20/22 211 lb 8 oz (95.9 kg)    Physical Exam  Constitutional: Patient appears well-developed and well-nourished. Obese  No distress.  HEENT: head atraumatic, normocephalic, pupils equal and reactive to light,  neck supple Cardiovascular: Normal rate, regular rhythm and normal heart sounds.  No murmur heard. No BLE edema. Pulmonary/Chest: Effort normal and breath sounds normal. No respiratory distress. Abdominal: Soft.  There is no tenderness. Psychiatric: Patient has a sad mood and affect. behavior is normal. Judgment and thought content normal.   Results for orders placed or performed in visit on 10/20/22  POCT Influenza A/B  Result Value Ref Range   Influenza A, POC Negative Negative   Influenza B, POC Negative Negative      Assessment & Plan:   Problem List Items Addressed This Visit       Cardiovascular and Mediastinum   Migraine without aura    Sees neurology for her migraines.  She is currently taking topiramate 50 mg daily, gabapentin 100 mg 3  times a day and verapamil 80 mg twice daily.      Relevant Orders   POCT Influenza A/B (Completed)   Novel Coronavirus, NAA ([facility])     Digestive   IBS (irritable  bowel syndrome)    Stable no changes.        Musculoskeletal and Integument   Lumbosacral spondylosis without myelopathy    she has been talking to two surgeons about treatment plans. She says that she has numbness from the left knee down.  One surgeon is recommending pt and injections and may need surgery with pins and rods and another gave her different options. She is weight out her options to decide what she is going to do.         Other   Class 1 obesity with body mass index (BMI) of 32.0 to 32.9 in adult    [facility].  Patient is going into it.      Prediabetes    Checking labs      Relevant Orders   COMPLETE METABOLIC PANEL WITH GFR   Hemoglobin A1c   Moderate episode of recurrent major depressive disorder (HCC)    Continue taking Celexa 20 mg daily.      Abnormal TSH    Checking labs.      Relevant Orders   TSH   Other Visit Diagnoses     Acute non-recurrent frontal sinusitis    -  Primary   Recommend taking zyrtec, flonase, mucinex, vitamin d, vitamin c, and zinc. Push fluids and get rest.   covid pending, flu negative   Relevant Orders   POCT Influenza A/B (Completed)   Novel Coronavirus, NAA ([facility])   Screening for deficiency anemia       Relevant Orders   CBC with Differential/Platelet        Follow up plan: Return in about 6 months (around 04/20/2023) for follow up.

## 2022-10-20 NOTE — Assessment & Plan Note (Signed)
Continue taking Celexa 20 mg daily.

## 2022-10-20 NOTE — Assessment & Plan Note (Signed)
[facility].  Patient is going into it.

## 2022-10-20 NOTE — Assessment & Plan Note (Signed)
Sees neurology for her migraines.  She is currently taking topiramate 50 mg daily, gabapentin 100 mg 3 times a day and verapamil 80 mg twice daily.

## 2022-10-20 NOTE — Assessment & Plan Note (Signed)
she has been talking to two surgeons about treatment plans. She says that she has numbness from the left knee down.  One surgeon is recommending pt and injections and may need surgery with pins and rods and another gave her different options. She is weight out her options to decide what she is going to do.

## 2022-10-22 LAB — NOVEL CORONAVIRUS, NAA: SARS-CoV-2, NAA: NOT DETECTED

## 2022-10-31 LAB — CBC WITH DIFFERENTIAL/PLATELET
Absolute Monocytes: 459 cells/uL (ref 200–950)
Basophils Absolute: 62 cells/uL (ref 0–200)
Basophils Relative: 1 %
Eosinophils Absolute: 198 cells/uL (ref 15–500)
Eosinophils Relative: 3.2 %
HCT: 37.7 % (ref 35.0–45.0)
Hemoglobin: 12.7 g/dL (ref 11.7–15.5)
Lymphs Abs: 2151 cells/uL (ref 850–3900)
MCH: 31.8 pg (ref 27.0–33.0)
MCHC: 33.7 g/dL (ref 32.0–36.0)
MCV: 94.3 fL (ref 80.0–100.0)
MPV: 11 fL (ref 7.5–12.5)
Monocytes Relative: 7.4 %
Neutro Abs: 3329 cells/uL (ref 1500–7800)
Neutrophils Relative %: 53.7 %
Platelets: 382 10*3/uL (ref 140–400)
RBC: 4 10*6/uL (ref 3.80–5.10)
RDW: 12.4 % (ref 11.0–15.0)
Total Lymphocyte: 34.7 %
WBC: 6.2 10*3/uL (ref 3.8–10.8)

## 2022-10-31 LAB — COMPLETE METABOLIC PANEL WITH GFR
AG Ratio: 1.3 (calc) (ref 1.0–2.5)
ALT: 16 U/L (ref 6–29)
AST: 23 U/L (ref 10–35)
Albumin: 3.7 g/dL (ref 3.6–5.1)
Alkaline phosphatase (APISO): 50 U/L (ref 37–153)
BUN: 23 mg/dL (ref 7–25)
CO2: 28 mmol/L (ref 20–32)
Calcium: 9.1 mg/dL (ref 8.6–10.4)
Chloride: 104 mmol/L (ref 98–110)
Creat: 0.8 mg/dL (ref 0.50–1.03)
Globulin: 2.9 g/dL (calc) (ref 1.9–3.7)
Glucose, Bld: 79 mg/dL (ref 65–99)
Potassium: 3.9 mmol/L (ref 3.5–5.3)
Sodium: 140 mmol/L (ref 135–146)
Total Bilirubin: 0.3 mg/dL (ref 0.2–1.2)
Total Protein: 6.6 g/dL (ref 6.1–8.1)
eGFR: 88 mL/min/{1.73_m2} (ref >=60)

## 2022-10-31 LAB — HEMOGLOBIN A1C
Hgb A1c MFr Bld: 5.8 % of total Hgb — ABNORMAL HIGH (ref <5.7)
Mean Plasma Glucose: 120 mg/dL
eAG (mmol/L): 6.6 mmol/L

## 2022-10-31 LAB — TSH: TSH: 0.5 mIU/L

## 2022-11-14 ENCOUNTER — Other Ambulatory Visit: Payer: Self-pay | Admitting: Obstetrics and Gynecology

## 2022-11-14 DIAGNOSIS — Z01419 Encounter for gynecological examination (general) (routine) without abnormal findings: Secondary | ICD-10-CM

## 2022-12-07 ENCOUNTER — Ambulatory Visit: Payer: BC Managed Care – PPO | Attending: Neurosurgery | Admitting: Physical Therapy

## 2022-12-07 ENCOUNTER — Telehealth: Payer: Self-pay | Admitting: Physical Therapy

## 2022-12-07 DIAGNOSIS — M5416 Radiculopathy, lumbar region: Secondary | ICD-10-CM | POA: Diagnosis not present

## 2022-12-07 DIAGNOSIS — R262 Difficulty in walking, not elsewhere classified: Secondary | ICD-10-CM | POA: Diagnosis present

## 2022-12-07 DIAGNOSIS — M79672 Pain in left foot: Secondary | ICD-10-CM | POA: Diagnosis present
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
## 2022-12-07 NOTE — Telephone Encounter (Signed)
Called pt to inquire about whether she wanted to come in earlier because of increased availability in schedule. Did not reach pt so left VM asking pt to call back.

## 2022-12-12 ENCOUNTER — Ambulatory Visit: Payer: BC Managed Care – PPO | Admitting: Physical Therapy

## 2022-12-12 DIAGNOSIS — M5416 Radiculopathy, lumbar region: Secondary | ICD-10-CM | POA: Diagnosis not present

## 2022-12-12 DIAGNOSIS — M79672 Pain in left foot: Secondary | ICD-10-CM

## 2022-12-12 DIAGNOSIS — R262 Difficulty in walking, not elsewhere classified: Secondary | ICD-10-CM
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
## 2022-12-13 ENCOUNTER — Ambulatory Visit: Payer: BC Managed Care – PPO | Admitting: Physical Therapy

## 2022-12-13 ENCOUNTER — Other Ambulatory Visit: Payer: Self-pay | Admitting: Nurse Practitioner

## 2022-12-13 DIAGNOSIS — F331 Major depressive disorder, recurrent, moderate: Secondary | ICD-10-CM

## 2022-12-14 ENCOUNTER — Ambulatory Visit: Payer: BC Managed Care – PPO | Admitting: Physical Therapy

## 2022-12-14 DIAGNOSIS — M5416 Radiculopathy, lumbar region: Secondary | ICD-10-CM

## 2022-12-14 MED ORDER — CITALOPRAM HYDROBROMIDE 20 MG PO TABS: 20.0000 mg | ORAL_TABLET | Freq: Every day | ORAL | 0 refills | Status: DC
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
## 2022-12-18 ENCOUNTER — Ambulatory Visit: Payer: BC Managed Care – PPO | Admitting: Physical Therapy

## 2022-12-19 ENCOUNTER — Telehealth: Payer: Self-pay | Admitting: Physical Therapy

## 2022-12-19 ENCOUNTER — Encounter: Payer: BC Managed Care – PPO | Admitting: Physical Therapy

## 2022-12-19 NOTE — Telephone Encounter (Signed)
Called pt to inquire about absence from PT. Did not reach so left VM instructing pt to call back to reschedule appointment.

## 2022-12-21 ENCOUNTER — Ambulatory Visit: Payer: BC Managed Care – PPO | Attending: Neurosurgery | Admitting: Physical Therapy

## 2022-12-21 DIAGNOSIS — R262 Difficulty in walking, not elsewhere classified: Secondary | ICD-10-CM | POA: Insufficient documentation

## 2022-12-21 DIAGNOSIS — M5416 Radiculopathy, lumbar region: Secondary | ICD-10-CM
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
## 2022-12-26 ENCOUNTER — Ambulatory Visit: Payer: BC Managed Care – PPO | Admitting: Physical Therapy

## 2022-12-26 DIAGNOSIS — M5416 Radiculopathy, lumbar region: Secondary | ICD-10-CM

## 2022-12-26 DIAGNOSIS — R262 Difficulty in walking, not elsewhere classified: Secondary | ICD-10-CM
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
## 2022-12-28 ENCOUNTER — Ambulatory Visit: Payer: BC Managed Care – PPO | Admitting: Physical Therapy

## 2022-12-28 DIAGNOSIS — M5416 Radiculopathy, lumbar region: Secondary | ICD-10-CM | POA: Diagnosis not present

## 2022-12-28 DIAGNOSIS — R262 Difficulty in walking, not elsewhere classified: Secondary | ICD-10-CM
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
## 2023-01-01 ENCOUNTER — Ambulatory Visit: Payer: BC Managed Care – PPO | Admitting: Physical Therapy

## 2023-01-02 ENCOUNTER — Encounter: Payer: BC Managed Care – PPO | Admitting: Obstetrics and Gynecology

## 2023-01-02 DIAGNOSIS — Z1231 Encounter for screening mammogram for malignant neoplasm of breast: Secondary | ICD-10-CM

## 2023-01-02 DIAGNOSIS — Z01419 Encounter for gynecological examination (general) (routine) without abnormal findings: Secondary | ICD-10-CM

## 2023-01-04 ENCOUNTER — Encounter: Payer: Self-pay | Admitting: Physical Therapy

## 2023-01-04 ENCOUNTER — Ambulatory Visit: Payer: BC Managed Care – PPO | Admitting: Physical Therapy

## 2023-01-04 DIAGNOSIS — M5416 Radiculopathy, lumbar region: Secondary | ICD-10-CM | POA: Diagnosis not present

## 2023-01-04 DIAGNOSIS — R262 Difficulty in walking, not elsewhere classified: Secondary | ICD-10-CM
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
## 2023-01-08 ENCOUNTER — Ambulatory Visit: Payer: BC Managed Care – PPO | Admitting: Physical Therapy

## 2023-01-08 DIAGNOSIS — M5416 Radiculopathy, lumbar region: Secondary | ICD-10-CM

## 2023-01-08 DIAGNOSIS — R262 Difficulty in walking, not elsewhere classified: Secondary | ICD-10-CM
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
## 2023-01-15 ENCOUNTER — Ambulatory Visit (INDEPENDENT_AMBULATORY_CARE_PROVIDER_SITE_OTHER): Payer: BC Managed Care – PPO | Admitting: Nurse Practitioner

## 2023-01-15 ENCOUNTER — Encounter: Payer: Self-pay | Admitting: Nurse Practitioner

## 2023-01-15 ENCOUNTER — Other Ambulatory Visit: Payer: Self-pay

## 2023-01-15 VITALS — BP 130/80 | HR 95 | Temp 97.8°F | Resp 16 | Wt 211.8 lb

## 2023-01-15 DIAGNOSIS — G4709 Other insomnia: Secondary | ICD-10-CM

## 2023-01-15 DIAGNOSIS — R0683 Snoring: Secondary | ICD-10-CM

## 2023-01-15 DIAGNOSIS — R52 Pain, unspecified: Secondary | ICD-10-CM | POA: Diagnosis not present

## 2023-01-15 DIAGNOSIS — R202 Paresthesia of skin: Secondary | ICD-10-CM

## 2023-01-15 DIAGNOSIS — G43009 Migraine without aura, not intractable, without status migrainosus: Secondary | ICD-10-CM

## 2023-01-15 DIAGNOSIS — F331 Major depressive disorder, recurrent, moderate: Secondary | ICD-10-CM | POA: Diagnosis not present

## 2023-01-15 DIAGNOSIS — R200 Anesthesia of skin: Secondary | ICD-10-CM | POA: Diagnosis not present

## 2023-01-15 DIAGNOSIS — R5383 Other fatigue: Secondary | ICD-10-CM

## 2023-01-15 DIAGNOSIS — R7989 Other specified abnormal findings of blood chemistry: Secondary | ICD-10-CM

## 2023-01-15 MED ORDER — CITALOPRAM HYDROBROMIDE 20 MG PO TABS: 20.0000 mg | ORAL_TABLET | Freq: Every day | ORAL | 1 refills | Status: DC

## 2023-01-15 NOTE — Progress Notes (Signed)
BP 130/80   Pulse 95   Temp 97.8 F (36.6 C) (Oral)   Resp 16   Wt 211 lb 12.8 oz (96.1 kg)   SpO2 96%   BMI 33.17 kg/m    Subjective:    Patient ID: Brittney Johns, female    DOB: [birth year], 52 y.o.   MRN: [id number]  HPI: Brittney Johns is a 52 y.o. female  Chief Complaint  Patient presents with   Fatigue   Generalized Body Aches   Headache   Insomnia    For 3 weeks   Fatigue/body aches/insomnia/ bilateral arm numbness: she says that she has had this full body fatigue, body aches and insomnia for about three weeks.  She says she just does not feel right.  She is not sleeping well.  She says she just has these body aches that are different then her chronic pain.  She also says that her bilateral arms will go numb and then ache.  She says it started in the right arm and now she also gets it in her left arm. Discussed that we will get labs but also to bring up with her neurologist.    Snoring: her husband has told her that she is snoring.  She is feeling run down.  She says she feels like she is not resting.  ESS: 5. Discussed with patient about getting a sleep study. She is going to bring it up to her neurologist.    Abnormal TSH: Patient had an abnormal TSH on 12/27/2021 and it was 0.426.  We redrew it on 01/11/2022 and it was back in normal range.  TSH was 0.57 on 03/23/2022.  Last checked on 10/30/2022 and it was 0.50. will get labs.   Migraine: she is established with neurology. She is taking topiramate 50 mg daily, gabapentin 100 mg three times a day and verapamil 80 mg BID. She does take OTC ibuprofen and occasionally uses Nurtec. She reports she has been having more headaches lately, recommend she reach out to her neurologist for appointment. She says she is going to call tomorrow.      01/15/2023    3:15 PM 10/20/2022    3:47 PM 10/20/2022    3:41 PM 07/10/2022    3:10 PM 04/20/2022    2:12 PM  Depression screen PHQ 2/9  Decreased Interest 0 0 0 0 1  Down, Depressed,  Hopeless 0 0 0 1 1  PHQ - 2 Score 0 0 0 1 2  Altered sleeping 3 0 0 1 0  Tired, decreased energy 3 0 0 1 1  Change in appetite 0 0 0 1 0  Feeling bad or failure about yourself  0 0 0 0 0  Trouble concentrating 0 0 0 0 0  Moving slowly or fidgety/restless 0 0 0 0 0  Suicidal thoughts 0 0 0 0 0  PHQ-9 Score 6 0 0 4 3  Difficult doing work/chores Somewhat difficult  Not difficult at all Somewhat difficult Not difficult at all    Relevant past medical, surgical, family and social history reviewed and updated as indicated. Interim medical history since our last visit reviewed. Allergies and medications reviewed and updated.  Review of Systems  Constitutional: Negative for fever or weight change. Positive for fatigue Respiratory: Negative for cough and shortness of breath.   Cardiovascular: Negative for chest pain or palpitations.  Gastrointestinal: Negative for abdominal pain, no bowel changes.  Musculoskeletal: Negative for gait problem or joint swelling.  Skin:  Negative for rash.  Neurological: Negative for dizziness, positive for  headache.  No other specific complaints in a complete review of systems (except as listed in HPI above).      Objective:    BP 130/80   Pulse 95   Temp 97.8 F (36.6 C) (Oral)   Resp 16   Wt 211 lb 12.8 oz (96.1 kg)   SpO2 96%   BMI 33.17 kg/m   Wt Readings from Last 3 Encounters:  01/15/23 211 lb 12.8 oz (96.1 kg)  10/20/22 205 lb 14.4 oz (93.4 kg)  07/10/22 210 lb 9.6 oz (95.5 kg)    Physical Exam  Constitutional: Patient appears well-developed and well-nourished. Obese  No distress.  HEENT: head atraumatic, normocephalic, pupils equal and reactive to light, neck supple Cardiovascular: Normal rate, regular rhythm and normal heart sounds.  No murmur heard. No BLE edema. Pulmonary/Chest: Effort normal and breath sounds normal. No respiratory distress. Abdominal: Soft.  There is no tenderness. Psychiatric: Patient has a normal mood and affect.  behavior is normal. Judgment and thought content normal.      Assessment & Plan:   Problem List Items Addressed This Visit       Cardiovascular and Mediastinum   Migraine without aura    Reports more headaches but different then her migraines.  Recommend reaching out to her neurologist      Relevant Medications   citalopram (CELEXA) 20 MG tablet     Other   Moderate episode of recurrent major depressive disorder (HCC)    Reports she needs a refill of her celexa      Relevant Medications   citalopram (CELEXA) 20 MG tablet   Abnormal TSH    Getting labs, endorses fatigue, body aches and insomnia      Other Visit Diagnoses     Generalized body aches    -  Primary   getting labs   Relevant Orders   CBC with Differential/Platelet   COMPLETE METABOLIC PANEL WITH GFR   TSH   VITAMIN D 25 Hydroxy (Vit-D Deficiency, Fractures)   Other fatigue       getting labs, may need sleep study   Relevant Orders   CBC with Differential/Platelet   COMPLETE METABOLIC PANEL WITH GFR   TSH   VITAMIN D 25 Hydroxy (Vit-D Deficiency, Fractures)   Bilateral arm numbness and tingling while sleeping       getting labs, recommend reaching out to neurology   Relevant Orders   VITAMIN D 25 Hydroxy (Vit-D Deficiency, Fractures)   Vitamin B12   Other insomnia       may need sleep study, getting labs   Snores       may need sleep study, she is established with neurology, recommend reaching out to them        Follow up plan: Return if symptoms worsen or fail to improve.

## 2023-01-15 NOTE — Assessment & Plan Note (Signed)
Reports she needs a refill of her celexa

## 2023-01-15 NOTE — Assessment & Plan Note (Signed)
Reports more headaches but different then her migraines.  Recommend reaching out to her neurologist

## 2023-01-15 NOTE — Assessment & Plan Note (Signed)
Getting labs, endorses fatigue, body aches and insomnia

## 2023-01-16 ENCOUNTER — Ambulatory Visit: Payer: BC Managed Care – PPO | Admitting: Physical Therapy

## 2023-01-16 ENCOUNTER — Other Ambulatory Visit: Payer: Self-pay | Admitting: Nurse Practitioner

## 2023-01-16 DIAGNOSIS — G43009 Migraine without aura, not intractable, without status migrainosus: Secondary | ICD-10-CM

## 2023-01-16 DIAGNOSIS — R202 Paresthesia of skin: Secondary | ICD-10-CM

## 2023-01-16 LAB — CBC WITH DIFFERENTIAL/PLATELET
Absolute Monocytes: 564 cells/uL (ref 200–950)
Basophils Absolute: 60 cells/uL (ref 0–200)
Basophils Relative: 1 %
Eosinophils Absolute: 288 cells/uL (ref 15–500)
Eosinophils Relative: 4.8 %
HCT: 41.3 % (ref 35.0–45.0)
Hemoglobin: 13.5 g/dL (ref 11.7–15.5)
Lymphs Abs: 2286 cells/uL (ref 850–3900)
MCH: 30.5 pg (ref 27.0–33.0)
MCHC: 32.7 g/dL (ref 32.0–36.0)
MCV: 93.2 fL (ref 80.0–100.0)
MPV: 10.9 fL (ref 7.5–12.5)
Monocytes Relative: 9.4 %
Neutro Abs: 2802 cells/uL (ref 1500–7800)
Neutrophils Relative %: 46.7 %
Platelets: 378 10*3/uL (ref 140–400)
RBC: 4.43 10*6/uL (ref 3.80–5.10)
RDW: 12.1 % (ref 11.0–15.0)
Total Lymphocyte: 38.1 %
WBC: 6 10*3/uL (ref 3.8–10.8)

## 2023-01-16 LAB — COMPLETE METABOLIC PANEL WITH GFR
AG Ratio: 1.5 (calc) (ref 1.0–2.5)
ALT: 14 U/L (ref 6–29)
AST: 22 U/L (ref 10–35)
Albumin: 4.1 g/dL (ref 3.6–5.1)
Alkaline phosphatase (APISO): 52 U/L (ref 37–153)
BUN: 10 mg/dL (ref 7–25)
CO2: 28 mmol/L (ref 20–32)
Calcium: 9.4 mg/dL (ref 8.6–10.4)
Chloride: 107 mmol/L (ref 98–110)
Creat: 0.78 mg/dL (ref 0.50–1.03)
Globulin: 2.7 g/dL (calc) (ref 1.9–3.7)
Glucose, Bld: 77 mg/dL (ref 65–99)
Potassium: 4 mmol/L (ref 3.5–5.3)
Sodium: 142 mmol/L (ref 135–146)
Total Bilirubin: 0.3 mg/dL (ref 0.2–1.2)
Total Protein: 6.8 g/dL (ref 6.1–8.1)
eGFR: 91 mL/min/{1.73_m2} (ref >=60)

## 2023-01-16 LAB — TSH: TSH: 0.6 mIU/L

## 2023-01-16 LAB — VITAMIN B12: Vitamin B-12: 459 pg/mL (ref 200–1100)

## 2023-01-16 LAB — VITAMIN D 25 HYDROXY (VIT D DEFICIENCY, FRACTURES): Vit D, 25-Hydroxy: 27 ng/mL — ABNORMAL LOW (ref 30–100)

## 2023-01-18 ENCOUNTER — Ambulatory Visit: Payer: BC Managed Care – PPO | Attending: Neurosurgery | Admitting: Physical Therapy

## 2023-01-18 ENCOUNTER — Telehealth: Payer: Self-pay | Admitting: Physical Therapy

## 2023-01-18 NOTE — Telephone Encounter (Signed)
Called pt to inquire about absence. Did not reach so left VM instructing pt to call back to reschedule.

## 2023-01-22 ENCOUNTER — Ambulatory Visit: Payer: BC Managed Care – PPO | Admitting: Physical Therapy

## 2023-01-22 ENCOUNTER — Telehealth: Payer: Self-pay | Admitting: Physical Therapy

## 2023-01-22 NOTE — Telephone Encounter (Signed)
Called pt to inquire about scheduling more appointments. She had called office earlier to report that she would not be able to make apt and that she wanted to know if she should schedule more. PT unable to reach pt so VM left instructing pt to call back to schedule more apts and to check in about her ongoing low back pain.

## 2023-01-26 ENCOUNTER — Ambulatory Visit: Payer: BC Managed Care – PPO | Admitting: Obstetrics and Gynecology

## 2023-01-26 DIAGNOSIS — Z01419 Encounter for gynecological examination (general) (routine) without abnormal findings: Secondary | ICD-10-CM

## 2023-01-26 DIAGNOSIS — Z1231 Encounter for screening mammogram for malignant neoplasm of breast: Secondary | ICD-10-CM

## 2023-03-14 ENCOUNTER — Encounter: Payer: Self-pay | Admitting: Obstetrics and Gynecology

## 2023-03-14 ENCOUNTER — Ambulatory Visit (INDEPENDENT_AMBULATORY_CARE_PROVIDER_SITE_OTHER): Payer: BC Managed Care – PPO | Admitting: Obstetrics and Gynecology

## 2023-03-14 VITALS — BP 113/79 | HR 86 | Ht 67.0 in | Wt 212.5 lb

## 2023-03-14 DIAGNOSIS — Z01419 Encounter for gynecological examination (general) (routine) without abnormal findings: Secondary | ICD-10-CM

## 2023-03-14 DIAGNOSIS — Z1231 Encounter for screening mammogram for malignant neoplasm of breast: Secondary | ICD-10-CM

## 2023-03-14 DIAGNOSIS — Z1211 Encounter for screening for malignant neoplasm of colon: Secondary | ICD-10-CM
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
## 2023-03-14 NOTE — Progress Notes (Signed)
Patients presents for annual exam today. She states currently taking her HRT but is experiencing irregular sweating during the day.History of hysterectomy due to endometriosis. Due for mammogram and colonoscopy, ordered. Annual labs are recently preformed by PCP. She states no other questions or concerns at this time.

## 2023-03-21 ENCOUNTER — Other Ambulatory Visit: Payer: Self-pay | Admitting: Obstetrics and Gynecology

## 2023-03-21 ENCOUNTER — Encounter: Payer: Self-pay | Admitting: *Deleted

## 2023-03-21 DIAGNOSIS — Z1231 Encounter for screening mammogram for malignant neoplasm of breast: Secondary | ICD-10-CM

## 2023-03-26 ENCOUNTER — Telehealth: Payer: BC Managed Care – PPO

## 2023-03-26 ENCOUNTER — Telehealth: Payer: BC Managed Care – PPO | Admitting: Physician Assistant

## 2023-03-26 DIAGNOSIS — T3695XA Adverse effect of unspecified systemic antibiotic, initial encounter: Secondary | ICD-10-CM | POA: Diagnosis not present

## 2023-03-26 DIAGNOSIS — J069 Acute upper respiratory infection, unspecified: Secondary | ICD-10-CM

## 2023-03-26 DIAGNOSIS — B379 Candidiasis, unspecified: Secondary | ICD-10-CM | POA: Diagnosis not present

## 2023-03-26 DIAGNOSIS — B9689 Other specified bacterial agents as the cause of diseases classified elsewhere: Secondary | ICD-10-CM

## 2023-03-26 MED ORDER — AMOXICILLIN-POT CLAVULANATE 875-125 MG PO TABS
1.0000 | ORAL_TABLET | Freq: Two times a day (BID) | ORAL | 0 refills | Status: DC
Start: 2023-03-26 — End: 2023-05-03

## 2023-03-26 MED ORDER — FLUCONAZOLE 150 MG PO TABS
150.0000 mg | ORAL_TABLET | ORAL | 0 refills | Status: DC | PRN
Start: 2023-03-26 — End: 2023-05-03

## 2023-03-26 NOTE — Progress Notes (Signed)
Virtual Visit Consent   Brittney Johns, you are scheduled for a virtual visit with a [facility] provider today. Just as with appointments in the office, your consent must be obtained to participate. Your consent will be active for this visit and any virtual visit you may have with one of our providers in the next 365 days. If you have a MyChart account, a copy of this consent can be sent to you electronically.  As this is a virtual visit, video technology does not allow for your provider to perform a traditional examination. This may limit your provider's ability to fully assess your condition. If your provider identifies any concerns that need to be evaluated in person or the need to arrange testing (such as labs, EKG, etc.), we will make arrangements to do so. Although advances in technology are sophisticated, we cannot ensure that it will always work on either your end or our end. If the connection with a video visit is poor, the visit may have to be switched to a telephone visit. With either a video or telephone visit, we are not always able to ensure that we have a secure connection.  By engaging in this virtual visit, you consent to the provision of healthcare and authorize for your insurance to be billed (if applicable) for the services provided during this visit. Depending on your insurance coverage, you may receive a charge related to this service.  I need to obtain your verbal consent now. Are you willing to proceed with your visit today? Brittney Johns has provided verbal consent on 03/26/2023 for a virtual visit (video or telephone). Brittney Loveless, PA-C  Date: 03/26/2023 12:59 PM  Virtual Visit via Video Note   I, Brittney Johns, connected with  Brittney Johns  ([id number], Jul 21, 1971) on 03/26/23 at  1:00 PM EDT by a video-enabled telemedicine application and verified that I am speaking with the correct person using two identifiers.  Location: Patient: Virtual Visit  Location Patient: Home Provider: Virtual Visit Location Provider: Home Office   I discussed the limitations of evaluation and management by telemedicine and the availability of in person appointments. The patient expressed understanding and agreed to proceed.    History of Present Illness: Brittney Johns is a 52 y.o. who identifies as a female who was assigned female at birth, and is being seen today for congestion and hoarseness.  HPI: URI  This is a new problem. The current episode started 1 to 4 weeks ago. The problem has been gradually worsening. There has been no fever. Associated symptoms include congestion, coughing (slightly productive), headaches, nausea, rhinorrhea, sinus pain and a sore throat. Pertinent negatives include no diarrhea, ear pain, neck pain, plugged ear sensation, sneezing, swollen glands, vomiting or wheezing. Associated symptoms comments: Hoarse voice, body aches. Treatments tried: flonase, mucinex. The treatment provided no relief.    Negative at home Covid 19 multiple times  Problems:  Patient Active Problem List   Diagnosis Date Noted   Abnormal TSH 10/20/2022   Lumbosacral spondylosis without myelopathy 10/02/2022   Trochanteric bursitis of right hip 06/06/2022   Moderate episode of recurrent major depressive disorder (HCC) 03/23/2022   Prediabetes 11/12/2020   Allergy 08/13/2020   Class 1 obesity with body mass index (BMI) of 32.0 to 32.9 in adult 09/20/2018   Family history of colon cancer requiring screening colonoscopy    Status post vaginal hysterectomy 04/11/2016   Anxiety 04/11/2016   Allergic rhinitis 12/31/2015   Constipation 12/23/2015  Surgical menopause 04/07/2015   Chronic right hip pain 04/05/2015   Endometriosis    IBS (irritable bowel syndrome)    Migraine without aura 10/17/2013   Headache 10/17/2013   FOM (frequency of micturition) 04/24/2013   Incomplete bladder emptying 04/24/2013   Chronic interstitial cystitis 04/24/2013     Allergies:  Allergies  Allergen Reactions   Imitrex [Sumatriptan] Anaphylaxis   Medications:  Current Outpatient Medications:    amoxicillin-clavulanate (AUGMENTIN) 875-125 MG tablet, Take 1 tablet by mouth 2 (two) times daily., Disp: 14 tablet, Rfl: 0   fluconazole (DIFLUCAN) 150 MG tablet, Take 1 tablet (150 mg total) by mouth every 3 (three) days as needed., Disp: 2 tablet, Rfl: 0   Biotin 5000 MCG CAPS, Take by mouth., Disp: , Rfl:    citalopram (CELEXA) 20 MG tablet, Take 1 tablet (20 mg total) by mouth daily., Disp: 90 tablet, Rfl: 1   estradiol (ESTRACE) 1 MG tablet, TAKE 1 AND 1/2 TABLETS(1.5 MG) BY MOUTH DAILY, Disp: 135 tablet, Rfl: 0   fluticasone (FLONASE) 50 MCG/ACT nasal spray, Place 2 sprays into both nostrils daily., Disp: 16 g, Rfl: 6   gabapentin (NEURONTIN) 100 MG capsule, Take 1 capsule (100 mg total) by mouth 3 (three) times daily., Disp: 270 capsule, Rfl: 2   imipramine (TOFRANIL) 25 MG tablet, Take 25 mg by mouth daily. , Disp: , Rfl:    meloxicam (MOBIC) 15 MG tablet, Take 1 tablet (15 mg total) by mouth daily., Disp: 30 tablet, Rfl: 3   methocarbamol (ROBAXIN) 750 MG tablet, Take 750 mg by mouth 3 (three) times daily., Disp: , Rfl:    Multiple Vitamins-Minerals (MULTIVITAMIN WITH MINERALS) tablet, Take 1 tablet by mouth daily., Disp: , Rfl:    ondansetron (ZOFRAN-ODT) 4 MG disintegrating tablet, Take 1 tablet (4 mg total) by mouth every 8 (eight) hours as needed for nausea or vomiting., Disp: 20 tablet, Rfl: 0   Rimegepant Sulfate (NURTEC) 75 MG TBDP, Take 75 mg by mouth daily as needed (take for abortive therapy of migraine, no more than 1 tablet in 24 hours or 10 per month)., Disp: 8 tablet, Rfl: 11   topiramate (TOPAMAX) 50 MG tablet, Take 1 tablet (50 mg total) by mouth daily. TAKE 1 TABLET BY MOUTH DAILY., Disp: 90 tablet, Rfl: 3   traMADol (ULTRAM) 50 MG tablet, tramadol 50 mg tablet  TAKE 1 TABLET BY MOUTH TWICE DAILY AS NEEDED, Disp: , Rfl:    verapamil  (CALAN) 80 MG tablet, Take 1 tablet (80 mg total) by mouth 2 (two) times daily. TAKE 1 TABLET (80 MG TOTAL) BY MOUTH 2 (TWO) TIMES DAILY., Disp: 180 tablet, Rfl: 3  Observations/Objective: Patient is well-developed, well-nourished in no acute distress.  Resting comfortably at home.  Head is normocephalic, atraumatic.  No labored breathing.  Speech is clear and coherent with logical content.  Patient is alert and oriented at baseline.    Assessment and Plan: 1. Bacterial upper respiratory infection - amoxicillin-clavulanate (AUGMENTIN) 875-125 MG tablet; Take 1 tablet by mouth 2 (two) times daily.  Dispense: 14 tablet; Refill: 0  2. Antibiotic-induced yeast infection - fluconazole (DIFLUCAN) 150 MG tablet; Take 1 tablet (150 mg total) by mouth every 3 (three) days as needed.  Dispense: 2 tablet; Refill: 0  - Worsening symptoms that have not responded to OTC medications.  - Will give Augmentin - Continue allergy medications.  - Steam and humidifier can help - Stay well hydrated and get plenty of rest.  - Diflucan given as  prophylaxis as patient tends to get vaginal yeast infections with antibiotic use - Seek in person evaluation if no symptom improvement or if symptoms worsen   Follow Up Instructions: I discussed the assessment and treatment plan with the patient. The patient was provided an opportunity to ask questions and all were answered. The patient agreed with the plan and demonstrated an understanding of the instructions.  A copy of instructions were sent to the patient via MyChart unless otherwise noted below.    The patient was advised to call back or seek an in-person evaluation if the symptoms worsen or if the condition fails to improve as anticipated.  Time:  I spent 10 minutes with the patient via telehealth technology discussing the above problems/concerns.    Brittney Loveless, PA-C

## 2023-03-26 NOTE — Patient Instructions (Signed)
[name], thank you for joining Margaretann Loveless, PA-C for today's virtual visit.  While this provider is not your primary care provider (PCP), if your PCP is located in our provider database this encounter information will be shared with them immediately following your visit.   A [place] MyChart account gives you access to today's visit and all your visits, tests, and labs performed at [facility] " click here if you don't have a [facility] MyChart account or go to [email or web address]  Consent: (Patient) Brittney Johns provided verbal consent for this virtual visit at the beginning of the encounter.  Current Medications:  Current Outpatient Medications:    amoxicillin-clavulanate (AUGMENTIN) 875-125 MG tablet, Take 1 tablet by mouth 2 (two) times daily., Disp: 14 tablet, Rfl: 0   fluconazole (DIFLUCAN) 150 MG tablet, Take 1 tablet (150 mg total) by mouth every 3 (three) days as needed., Disp: 2 tablet, Rfl: 0   Biotin 5000 MCG CAPS, Take by mouth., Disp: , Rfl:    citalopram (CELEXA) 20 MG tablet, Take 1 tablet (20 mg total) by mouth daily., Disp: 90 tablet, Rfl: 1   estradiol (ESTRACE) 1 MG tablet, TAKE 1 AND 1/2 TABLETS(1.5 MG) BY MOUTH DAILY, Disp: 135 tablet, Rfl: 0   fluticasone (FLONASE) 50 MCG/ACT nasal spray, Place 2 sprays into both nostrils daily., Disp: 16 g, Rfl: 6   gabapentin (NEURONTIN) 100 MG capsule, Take 1 capsule (100 mg total) by mouth 3 (three) times daily., Disp: 270 capsule, Rfl: 2   imipramine (TOFRANIL) 25 MG tablet, Take 25 mg by mouth daily. , Disp: , Rfl:    meloxicam (MOBIC) 15 MG tablet, Take 1 tablet (15 mg total) by mouth daily., Disp: 30 tablet, Rfl: 3   methocarbamol (ROBAXIN) 750 MG tablet, Take 750 mg by mouth 3 (three) times daily., Disp: , Rfl:    Multiple Vitamins-Minerals (MULTIVITAMIN WITH MINERALS) tablet, Take 1 tablet by mouth daily., Disp: , Rfl:    ondansetron (ZOFRAN-ODT) 4 MG disintegrating tablet, Take 1  tablet (4 mg total) by mouth every 8 (eight) hours as needed for nausea or vomiting., Disp: 20 tablet, Rfl: 0   Rimegepant Sulfate (NURTEC) 75 MG TBDP, Take 75 mg by mouth daily as needed (take for abortive therapy of migraine, no more than 1 tablet in 24 hours or 10 per month)., Disp: 8 tablet, Rfl: 11   topiramate (TOPAMAX) 50 MG tablet, Take 1 tablet (50 mg total) by mouth daily. TAKE 1 TABLET BY MOUTH DAILY., Disp: 90 tablet, Rfl: 3   traMADol (ULTRAM) 50 MG tablet, tramadol 50 mg tablet  TAKE 1 TABLET BY MOUTH TWICE DAILY AS NEEDED, Disp: , Rfl:    verapamil (CALAN) 80 MG tablet, Take 1 tablet (80 mg total) by mouth 2 (two) times daily. TAKE 1 TABLET (80 MG TOTAL) BY MOUTH 2 (TWO) TIMES DAILY., Disp: 180 tablet, Rfl: 3   Medications ordered in this encounter:  Meds ordered this encounter  Medications   amoxicillin-clavulanate (AUGMENTIN) 875-125 MG tablet    Sig: Take 1 tablet by mouth 2 (two) times daily.    Dispense:  14 tablet    Refill:  0    Order Specific Question:   Supervising Provider    Answer:   Merrilee Jansky [[id number]]   fluconazole (DIFLUCAN) 150 MG tablet    Sig: Take 1 tablet (150 mg total) by mouth every 3 (three) days as needed.    Dispense:  2 tablet    Refill:  0    Order Specific Question:   Supervising Provider    Answer:   Merrilee Jansky [[id number]]     *If you need refills on other medications prior to your next appointment, please contact your pharmacy*  Follow-Up: Call back or seek an in-person evaluation if the symptoms worsen or if the condition fails to improve as anticipated.  [facility] [phone]  Other Instructions  Upper Respiratory Infection, Adult An upper respiratory infection (URI) is a common viral infection of the nose, throat, and upper air passages that lead to the lungs. The most common type of URI is the common cold. URIs usually get better on their own, without medical treatment. What are the causes? A URI is  caused by a virus. You may catch a virus by: Breathing in droplets from an infected person's cough or sneeze. Touching something that has been exposed to the virus (is contaminated) and then touching your mouth, nose, or eyes. What increases the risk? You are more likely to get a URI if: You are very young or very old. You have close contact with others, such as at work, school, or a health care facility. You smoke. You have long-term (chronic) heart or lung disease. You have a weakened disease-fighting system (immune system). You have nasal allergies or asthma. You are experiencing a lot of stress. You have poor nutrition. What are the signs or symptoms? A URI usually involves some of the following symptoms: Runny or stuffy (congested) nose. Cough. Sneezing. Sore throat. Headache. Fatigue. Fever. Loss of appetite. Pain in your forehead, behind your eyes, and over your cheekbones (sinus pain). Muscle aches. Redness or irritation of the eyes. Pressure in the ears or face. How is this diagnosed? This condition may be diagnosed based on your medical history and symptoms, and a physical exam. Your health care provider may use a swab to take a mucus sample from your nose (nasal swab). This sample can be tested to determine what virus is causing the illness. How is this treated? URIs usually get better on their own within 7-10 days. Medicines cannot cure URIs, but your health care provider may recommend certain medicines to help relieve symptoms, such as: Over-the-counter cold medicines. Cough suppressants. Coughing is a type of defense against infection that helps to clear the respiratory system, so take these medicines only as recommended by your health care provider. Fever-reducing medicines. Follow these instructions at home: Activity Rest as needed. If you have a fever, stay home from work or school until your fever is gone or until your health care provider says your URI cannot  spread to other people (is no longer contagious). Your health care provider may have you wear a face mask to prevent your infection from spreading. Relieving symptoms Gargle with a mixture of salt and water 3-4 times a day or as needed. To make salt water, completely dissolve -1 tsp (3-6 g) of salt in 1 cup (237 mL) of warm water. Use a cool-mist humidifier to add moisture to the air. This can help you breathe more easily. Eating and drinking  Drink enough fluid to keep your urine pale yellow. Eat soups and other clear broths. General instructions  Take over-the-counter and prescription medicines only as told by your health care provider. These include cold medicines, fever reducers, and cough suppressants. Do not use any products that contain nicotine or tobacco. These products include cigarettes, chewing tobacco, and vaping devices, such as e-cigarettes. If you need help quitting,  ask your health care provider. Stay away from secondhand smoke. Stay up to date on all immunizations, including the yearly (annual) flu vaccine. Keep all follow-up visits. This is important. How to prevent the spread of infection to others URIs can be contagious. To prevent the infection from spreading: Wash your hands with soap and water for at least 20 seconds. If soap and water are not available, use hand sanitizer. Avoid touching your mouth, face, eyes, or nose. Cough or sneeze into a tissue or your sleeve or elbow instead of into your hand or into the air.  Contact a health care provider if: You are getting worse instead of better. You have a fever or chills. Your mucus is brown or red. You have yellow or brown discharge coming from your nose. You have pain in your face, especially when you bend forward. You have swollen neck glands. You have pain while swallowing. You have white areas in the back of your throat. Get help right away if: You have shortness of breath that gets worse. You have severe or  persistent: Headache. Ear pain. Sinus pain. Chest pain. You have chronic lung disease along with any of the following: Making high-pitched whistling sounds when you breathe, most often when you breathe out (wheezing). Prolonged cough (more than 14 days). Coughing up blood. A change in your usual mucus. You have a stiff neck. You have changes in your: Vision. Hearing. Thinking. Mood. These symptoms may be an emergency. Get help right away. Call 911. Do not wait to see if the symptoms will go away. Do not drive yourself to the hospital. Summary An upper respiratory infection (URI) is a common infection of the nose, throat, and upper air passages that lead to the lungs. A URI is caused by a virus. URIs usually get better on their own within 7-10 days. Medicines cannot cure URIs, but your health care provider may recommend certain medicines to help relieve symptoms. This information is not intended to replace advice given to you by your health care provider. Make sure you discuss any questions you have with your health care provider. Document Revised: 04/06/2021 Document Reviewed: 04/06/2021 Elsevier Patient Education  2024 Elsevier Inc.    If you have been instructed to have an in-person evaluation today at a local Urgent Care facility, please use the link below. It will take you to a list of all of our available [facility], including address, phone number and hours of operation. Please do not delay care.  [facility]  If you or a family member do not have a primary care provider, use the link below to schedule a visit and establish care. When you choose a [facility] primary care physician or advanced practice provider, you gain a long-term partner in health. Find a Primary Care Provider  Learn more about [facility]'s in-office and virtual care options: [facility] - Get Care Now

## 2023-03-30 NOTE — Telephone Encounter (Unsigned)
Copied from CRM [id number]. Topic: General - Inquiry >> Mar 30, 2023  3:16 PM Runell Gess P wrote: Reason for CRM: pt called asking what the icd10 cody was for the Zepbound that was ordered for her back in Jan.  She went to the [facility] at the corner of [address] today to pick it up and they needed the code in order to give it to her,  The pharmacy needs Della Goo to send the icd 10 code.

## 2023-04-03 NOTE — Telephone Encounter (Signed)
Code sent to pharmacy.

## 2023-04-10 ENCOUNTER — Other Ambulatory Visit: Payer: Self-pay | Admitting: Nurse Practitioner

## 2023-04-10 ENCOUNTER — Encounter: Payer: Self-pay | Admitting: Nurse Practitioner

## 2023-04-10 DIAGNOSIS — E669 Obesity, unspecified: Secondary | ICD-10-CM

## 2023-04-10 MED ORDER — ZEPBOUND 5 MG/0.5ML ~~LOC~~ SOAJ: 5.0000 mg | SUBCUTANEOUS | 0 refills | Status: DC

## 2023-04-17 ENCOUNTER — Ambulatory Visit
Admission: RE | Admit: 2023-04-17 | Discharge: 2023-04-17 | Disposition: A | Discharge status: Home / Self Care | Payer: BC Managed Care – PPO | Source: Ambulatory Visit | Attending: Obstetrics and Gynecology | Admitting: Obstetrics and Gynecology

## 2023-04-17 DIAGNOSIS — Z1231 Encounter for screening mammogram for malignant neoplasm of breast: Secondary | ICD-10-CM | POA: Insufficient documentation

## 2023-04-24 ENCOUNTER — Other Ambulatory Visit: Payer: Self-pay | Admitting: Obstetrics and Gynecology

## 2023-04-24 DIAGNOSIS — Z01419 Encounter for gynecological examination (general) (routine) without abnormal findings: Secondary | ICD-10-CM

## 2023-05-03 ENCOUNTER — Encounter: Payer: Self-pay | Admitting: Neurology

## 2023-05-03 ENCOUNTER — Ambulatory Visit: Payer: BC Managed Care – PPO | Admitting: Neurology

## 2023-05-03 VITALS — BP 118/78 | HR 85 | Ht 67.0 in | Wt 203.6 lb

## 2023-05-03 DIAGNOSIS — R202 Paresthesia of skin: Secondary | ICD-10-CM | POA: Diagnosis not present

## 2023-05-03 DIAGNOSIS — G43009 Migraine without aura, not intractable, without status migrainosus: Secondary | ICD-10-CM

## 2023-05-03 DIAGNOSIS — M5416 Radiculopathy, lumbar region: Secondary | ICD-10-CM | POA: Diagnosis not present

## 2023-05-03 MED ORDER — GABAPENTIN 100 MG PO CAPS: 100.0000 mg | ORAL_CAPSULE | Freq: Three times a day (TID) | ORAL | 3 refills | Status: DC

## 2023-05-03 MED ORDER — VERAPAMIL HCL 80 MG PO TABS: 80.0000 mg | ORAL_TABLET | Freq: Two times a day (BID) | ORAL | 3 refills | Status: DC
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
## 2023-05-18 ENCOUNTER — Telehealth: Payer: Self-pay

## 2023-05-18 NOTE — Telephone Encounter (Signed)
Pt called to schedule colonoscopy requesting a call back

## 2023-05-21 ENCOUNTER — Encounter: Payer: Self-pay | Admitting: Nurse Practitioner

## 2023-05-22 ENCOUNTER — Other Ambulatory Visit: Payer: Self-pay | Admitting: Nurse Practitioner

## 2023-05-22 DIAGNOSIS — E669 Obesity, unspecified: Secondary | ICD-10-CM

## 2023-05-22 MED ORDER — ZEPBOUND 7.5 MG/0.5ML ~~LOC~~ SOAJ
7.5000 mg | SUBCUTANEOUS | 0 refills | Status: AC
Start: 2023-05-22 — End: ?

## 2023-05-25 ENCOUNTER — Ambulatory Visit: Payer: BC Managed Care – PPO | Admitting: Nurse Practitioner

## 2023-05-25 NOTE — Progress Notes (Deleted)
There were no vitals taken for this visit.   Subjective:    Patient ID: Brittney Johns, female    DOB: 1971/06/24, 52 y.o.   MRN: [id number]  HPI: Brittney Johns is a 52 y.o. female  No chief complaint on file.   Relevant past medical, surgical, family and social history reviewed and updated as indicated. Interim medical history since our last visit reviewed. Allergies and medications reviewed and updated.  Review of Systems  Constitutional: Negative for fever or weight change.  Respiratory: Negative for cough and shortness of breath.   Cardiovascular: Negative for chest pain or palpitations.  Gastrointestinal: Negative for abdominal pain, no bowel changes.  Musculoskeletal: Negative for gait problem or joint swelling.  Skin: Negative for rash.  Neurological: Negative for dizziness or headache.  No other specific complaints in a complete review of systems (except as listed in HPI above).      Objective:    There were no vitals taken for this visit.  Wt Readings from Last 3 Encounters:  05/03/23 203 lb 9.6 oz (92.4 kg)  03/14/23 212 lb 8 oz (96.4 kg)  01/15/23 211 lb 12.8 oz (96.1 kg)    Physical Exam  Constitutional: Patient appears well-developed and well-nourished. Obese *** No distress.  HEENT: head atraumatic, normocephalic, pupils equal and reactive to light, ears ***, neck supple, throat within normal limits Cardiovascular: Normal rate, regular rhythm and normal heart sounds.  No murmur heard. No BLE edema. Pulmonary/Chest: Effort normal and breath sounds normal. No respiratory distress. Abdominal: Soft.  There is no tenderness. Psychiatric: Patient has a normal mood and affect. behavior is normal. Judgment and thought content normal.  Results for orders placed or performed in visit on 01/15/23  CBC with Differential/Platelet  Result Value Ref Range   WBC 6.0 3.8 - 10.8 Thousand/uL   RBC 4.43 3.80 - 5.10 Million/uL   Hemoglobin 13.5 11.7 - 15.5 g/dL   HCT  40.9 81.1 - 91.4 %   MCV 93.2 80.0 - 100.0 fL   MCH 30.5 27.0 - 33.0 pg   MCHC 32.7 32.0 - 36.0 g/dL   RDW 78.2 95.6 - 21.3 %   Platelets 378 140 - 400 Thousand/uL   MPV 10.9 7.5 - 12.5 fL   Neutro Abs 2,802 1,500 - 7,800 cells/uL   Lymphs Abs 2,286 850 - 3,900 cells/uL   Absolute Monocytes 564 200 - 950 cells/uL   Eosinophils Absolute 288 15 - 500 cells/uL   Basophils Absolute 60 0 - 200 cells/uL   Neutrophils Relative % 46.7 %   Total Lymphocyte 38.1 %   Monocytes Relative 9.4 %   Eosinophils Relative 4.8 %   Basophils Relative 1.0 %  COMPLETE METABOLIC PANEL WITH GFR  Result Value Ref Range   Glucose, Bld 77 65 - 99 mg/dL   BUN 10 7 - 25 mg/dL   Creat 0.86 5.78 - 4.69 mg/dL   eGFR 91 > OR = 60 GE/XBM/8.41L2   BUN/Creatinine Ratio SEE NOTE: 6 - 22 (calc)   Sodium 142 135 - 146 mmol/L   Potassium 4.0 3.5 - 5.3 mmol/L   Chloride 107 98 - 110 mmol/L   CO2 28 20 - 32 mmol/L   Calcium 9.4 8.6 - 10.4 mg/dL   Total Protein 6.8 6.1 - 8.1 g/dL   Albumin 4.1 3.6 - 5.1 g/dL   Globulin 2.7 1.9 - 3.7 g/dL (calc)   AG Ratio 1.5 1.0 - 2.5 (calc)   Total Bilirubin 0.3 0.2 -  1.2 mg/dL   Alkaline phosphatase (APISO) 52 37 - 153 U/L   AST 22 10 - 35 U/L   ALT 14 6 - 29 U/L  TSH  Result Value Ref Range   TSH 0.60 mIU/L  VITAMIN D 25 Hydroxy (Vit-D Deficiency, Fractures)  Result Value Ref Range   Vit D, 25-Hydroxy 27 (L) 30 - 100 ng/mL  Vitamin B12  Result Value Ref Range   Vitamin B-12 459 200 - 1,100 pg/mL      Assessment & Plan:   Problem List Items Addressed This Visit   None    Follow up plan: No follow-ups on file.

## 2023-05-28 ENCOUNTER — Telehealth (INDEPENDENT_AMBULATORY_CARE_PROVIDER_SITE_OTHER): Payer: BC Managed Care – PPO | Admitting: Nurse Practitioner

## 2023-05-28 ENCOUNTER — Encounter: Payer: Self-pay | Admitting: Nurse Practitioner

## 2023-05-28 ENCOUNTER — Other Ambulatory Visit: Payer: Self-pay

## 2023-05-28 VITALS — Ht 67.0 in | Wt 199.0 lb

## 2023-05-28 DIAGNOSIS — K5900 Constipation, unspecified: Secondary | ICD-10-CM

## 2023-05-28 MED ORDER — LUBIPROSTONE 8 MCG PO CAPS: 8.0000 ug | ORAL_CAPSULE | Freq: Two times a day (BID) | ORAL | 0 refills | Status: DC

## 2023-05-28 MED ORDER — POLYETHYLENE GLYCOL 3350 17 GM/SCOOP PO POWD
17.0000 g | Freq: Every day | ORAL | 0 refills | Status: DC
Start: 2023-05-28 — End: 2024-03-06

## 2023-05-28 NOTE — Progress Notes (Signed)
Name: Brittney Johns   MRN: [id number]    DOB: [birth year]   Date:05/28/2023       Progress Note  Subjective  Chief Complaint  Chief Complaint  Patient presents with   Constipation    No bowel movement since August 14    I connected with  Brittney Johns  on 05/28/23 at  2:00 PM EDT by a video enabled telemedicine application and verified that I am speaking with the correct person using two identifiers.  I discussed the limitations of evaluation and management by telemedicine and the availability of in person appointments. The patient expressed understanding and agreed to proceed with a virtual visit  Staff also discussed with the patient that there may be a patient responsible charge related to this service. Patient Location: home Provider Location: [facility] Additional Individuals present: alone  HPI  Constipation: patient reports she has been constipated since August 14. She has been struggling with constipation but it got worse on August 14.  She reports that she has an appointment with GI at the end of October.  She says that she will pass small stool balls.  She says she has tried exlax,enema.  She has also been doing more prune juice but no movement.  She then ate an apple and things started to move.  She says the bowel movement was painful but has improved. She had two bowel movements yesterday and one today.  She reports that she is still passing hard stool.  Discussed that this could be caused by the zepbound.  Will  hold zepbound at this time. Start miralax and amitiza.  Increase fiber and water in your diet.  Send me a mychart message in the next few days and let me know how you are doing.    Patient Active Problem List   Diagnosis Date Noted   Paresthesia 05/03/2023   Left lumbar radiculopathy 05/03/2023   Abnormal TSH 10/20/2022   Lumbosacral spondylosis without myelopathy 10/02/2022   Trochanteric bursitis of right hip 06/06/2022   Moderate episode of recurrent major  depressive disorder (HCC) 03/23/2022   Prediabetes 11/12/2020   Allergy 08/13/2020   Class 1 obesity with body mass index (BMI) of 32.0 to 32.9 in adult 09/20/2018   Family history of colon cancer requiring screening colonoscopy    Status post vaginal hysterectomy 04/11/2016   Anxiety 04/11/2016   Allergic rhinitis 12/31/2015   Constipation 12/23/2015   Surgical menopause 04/07/2015   Chronic right hip pain 04/05/2015   Endometriosis    IBS (irritable bowel syndrome)    Migraine without aura 10/17/2013   Headache 10/17/2013   FOM (frequency of micturition) 04/24/2013   Incomplete bladder emptying 04/24/2013   Chronic interstitial cystitis 04/24/2013    Social History   Tobacco Use   Smoking status: Never   Smokeless tobacco: Never  Substance Use Topics   Alcohol use: No    Alcohol/week: 0.0 standard drinks of alcohol     Current Outpatient Medications:    Biotin 5000 MCG CAPS, Take by mouth., Disp: , Rfl:    citalopram (CELEXA) 20 MG tablet, Take 1 tablet (20 mg total) by mouth daily., Disp: 90 tablet, Rfl: 1   estradiol (ESTRACE) 1 MG tablet, TAKE 1 AND 1/2 TABLETS(1.5 MG) BY MOUTH DAILY, Disp: 135 tablet, Rfl: 0   fluticasone (FLONASE) 50 MCG/ACT nasal spray, Place 2 sprays into both nostrils daily., Disp: 16 g, Rfl: 6   gabapentin (NEURONTIN) 100 MG capsule, Take 1 capsule (100 mg total)  by mouth 3 (three) times daily., Disp: 270 capsule, Rfl: 3   imipramine (TOFRANIL) 25 MG tablet, Take 25 mg by mouth daily. , Disp: , Rfl:    lubiprostone (AMITIZA) 8 MCG capsule, Take 1 capsule (8 mcg total) by mouth 2 (two) times daily with a meal., Disp: 60 capsule, Rfl: 0   methocarbamol (ROBAXIN) 750 MG tablet, Take 750 mg by mouth 3 (three) times daily., Disp: , Rfl:    Multiple Vitamins-Minerals (MULTIVITAMIN WITH MINERALS) tablet, Take 1 tablet by mouth daily., Disp: , Rfl:    polyethylene glycol powder (GLYCOLAX/MIRALAX) 17 GM/SCOOP powder, Take 17 g by mouth daily., Disp: 3350 g,  Rfl: 0   Rimegepant Sulfate (NURTEC) 75 MG TBDP, Take 75 mg by mouth daily as needed (take for abortive therapy of migraine, no more than 1 tablet in 24 hours or 10 per month)., Disp: 8 tablet, Rfl: 11   tirzepatide (ZEPBOUND) 7.5 MG/0.5ML Pen, Inject 7.5 mg into the skin once a week., Disp: 2 mL, Rfl: 0   topiramate (TOPAMAX) 50 MG tablet, Take 1 tablet (50 mg total) by mouth daily. TAKE 1 TABLET BY MOUTH DAILY., Disp: 90 tablet, Rfl: 3   traMADol (ULTRAM) 50 MG tablet, tramadol 50 mg tablet  TAKE 1 TABLET BY MOUTH TWICE DAILY AS NEEDED, Disp: , Rfl:    verapamil (CALAN) 80 MG tablet, Take 1 tablet (80 mg total) by mouth 2 (two) times daily. TAKE 1 TABLET (80 MG TOTAL) BY MOUTH 2 (TWO) TIMES DAILY., Disp: 180 tablet, Rfl: 3  Allergies  Allergen Reactions   Imitrex [Sumatriptan] Anaphylaxis    I personally reviewed active problem list, medication list, allergies with the patient/caregiver today.  ROS  Constitutional: Negative for fever or weight change.  Respiratory: Negative for cough and shortness of breath.   Cardiovascular: Negative for chest pain or palpitations.  Gastrointestinal: Negative for abdominal pain, constipation  Musculoskeletal: Negative for gait problem or joint swelling.  Skin: Negative for rash.  Neurological: Negative for dizziness or headache.  No other specific complaints in a complete review of systems (except as listed in HPI above).   Objective  Virtual encounter, vitals not obtained.  Body mass index is 31.17 kg/m.  Nursing Note and Vital Signs reviewed.  Physical Exam  Awake, alert and oriented.   No results found for this or any previous visit (from the past 72 hour(s)).  Assessment & Plan  1. Constipation, unspecified constipation type Eat high fiber diet, push fluids Start miralax daily for the next few days Start [employer] zepbound for now [employer] regarding how you are feeling in the next few days.   - lubiprostone  (AMITIZA) 8 MCG capsule; Take 1 capsule (8 mcg total) by mouth 2 (two) times daily with a meal.  Dispense: 60 capsule; Refill: 0 - polyethylene glycol powder (GLYCOLAX/MIRALAX) 17 GM/SCOOP powder; Take 17 g by mouth daily.  Dispense: 3350 g; Refill: 0   -Red flags and when to present for emergency care or RTC including fever >101.47F, chest pain, shortness of breath, new/worsening/un-resolving symptoms,  reviewed with patient at time of visit. Follow up and care instructions discussed and provided in AVS. - I discussed the assessment and treatment plan with the patient. The patient was provided an opportunity to ask questions and all were answered. The patient agreed with the plan and demonstrated an understanding of the instructions.  I provided 15 minutes of non-face-to-face time during this encounter.  Berniece Salines, FNP

## 2023-06-20 NOTE — Patient Instructions (Signed)
Below is our plan:  We will continue verapamil, topiramate, gabapentin and Nurtec.  Please make sure you are staying well hydrated. I recommend 50-60 ounces daily. Well balanced diet and regular exercise encouraged. Consistent sleep schedule with 6-8 hours recommended.   Please continue follow up with care team as directed.   Follow up with me in 1 year   You may receive a survey regarding today's visit. I encourage you to leave honest feed back as I do use this information to improve patient care. Thank you for seeing me today!     GENERAL HEADACHE INFORMATION:   Natural supplements: Magnesium Oxide or Magnesium Glycinate 500 mg at bed (up to 800 mg daily) Coenzyme Q10 300 mg in AM Vitamin B2- 200 mg twice a day   Add 1 supplement at a time since even natural supplements can have undesirable side effects. You can sometimes buy supplements cheaper (especially Coenzyme Q10) at www.WebmailGuide.co.za or at [facility].  Migraine with aura: There is increased risk for stroke in women with migraine with aura and a contraindication for the combined contraceptive pill for use by women who have migraine with aura. The risk for women with migraine without aura is lower. However other risk factors like smoking are far more likely to increase stroke risk than migraine. There is a recommendation for no smoking and for the use of OCPs without estrogen such as progestogen only pills particularly for women with migraine with aura.Marland Kitchen People who have migraine headaches with auras may be 3 times more likely to have a stroke caused by a blood clot, compared to migraine patients who don't see auras. Women who take hormone-replacement therapy may be 30 percent more likely to suffer a clot-based stroke than women not taking medication containing estrogen. Other risk factors like smoking and high blood pressure may be  much more important.    Vitamins and herbs that show potential:   Magnesium: Magnesium (250 mg twice a day  or 500 mg at bed) has a relaxant effect on smooth muscles such as blood vessels. Individuals suffering from frequent or daily headache usually have low magnesium levels which can be increase with daily supplementation of 400-750 mg. Three trials found 40-90% average headache reduction  when used as a preventative. Magnesium may help with headaches are aura, the best evidence for magnesium is for migraine with aura is its thought to stop the cortical spreading depression we believe is the pathophysiology of migraine aura.Magnesium also demonstrated the benefit in menstrually related migraine.  Magnesium is part of the messenger system in the serotonin cascade and it is a good muscle relaxant.  It is also useful for constipation which can be a side effect of other medications used to treat migraine. Good sources include nuts, whole grains, and tomatoes. Side Effects: loose stool/diarrhea  Riboflavin (vitamin B 2) 200 mg twice a day. This vitamin assists nerve cells in the production of ATP a principal energy storing molecule.  It is necessary for many chemical reactions in the body.  There have been at least 3 clinical trials of riboflavin using 400 mg per day all of which suggested that migraine frequency can be decreased.  All 3 trials showed significant improvement in over half of migraine sufferers.  The supplement is found in bread, cereal, milk, meat, and poultry.  Most Americans get more riboflavin than the recommended daily allowance, however riboflavin deficiency is not necessary for the supplements to help prevent headache. Side effects: energizing, green urine   Coenzyme  Q10: This is present in almost all cells in the body and is critical component for the conversion of energy.  Recent studies have shown that a nutritional supplement of CoQ10 can reduce the frequency of migraine attacks by improving the energy production of cells as with riboflavin.  Doses of 150 mg twice a day have been shown to be  effective.   Melatonin: Increasing evidence shows correlation between melatonin secretion and headache conditions.  Melatonin supplementation has decreased headache intensity and duration.  It is widely used as a sleep aid.  Sleep is natures way of dealing with migraine.  A dose of 3 mg is recommended to start for headaches including cluster headache. Higher doses up to 15 mg has been reviewed for use in Cluster headache and have been used. The rationale behind using melatonin for cluster is that many theories regarding the cause of Cluster headache center around the disruption of the normal circadian rhythm in the brain.  This helps restore the normal circadian rhythm.   HEADACHE DIET: Foods and beverages which may trigger migraine Note that only 20% of headache patients are food sensitive. You will know if you are food sensitive if you get a headache consistently 20 minutes to 2 hours after eating a certain food. Only cut out a food if it causes headaches, otherwise you might remove foods you enjoy! What matters most for diet is to eat a well balanced healthy diet full of vegetables and low fat protein, and to not miss meals.   Chocolate, other sweets ALL cheeses except cottage and cream cheese Dairy products, yogurt, sour cream, ice cream Liver Meat extracts (Bovril, Marmite, meat tenderizers) Meats or fish which have undergone aging, fermenting, pickling or smoking. These include: Hotdogs,salami,Lox,sausage, mortadellas,smoked salmon, pepperoni, Pickled herring Pods of broad bean (English beans, Chinese pea pods, [place] (fava) beans, lima and navy beans Ripe avocado, ripe banana Yeast extracts or active yeast preparations such as Brewer's or Fleishman's (commercial bakes goods are permitted) Tomato based foods, pizza (lasagna, etc.)   MSG (monosodium glutamate) is disguised as many things; look for these common aliases: Monopotassium glutamate Autolysed yeast Hydrolysed protein Sodium  caseinate "flavorings" "all natural preservatives" Nutrasweet   Avoid all other foods that convincingly provoke headaches.   Resources: The Dizzy [place] Laundry Your Headache Diet, migrainestrong.com  https://zamora-andrews.com/   Caffeine and Migraine For patients that have migraine, caffeine intake more than 3 days per week can lead to dependency and increased migraine frequency. I would recommend cutting back on your caffeine intake as best you can. The recommended amount of caffeine is 200-300 mg daily, although migraine patients may experience dependency at even lower doses. While you may notice an increase in headache temporarily, cutting back will be helpful for headaches in the long run. For more information on caffeine and migraine, visit: https://americanmigrainefoundation.org/resource-library/caffeine-and-migraine/   Headache Prevention Strategies:   1. Maintain a headache diary; learn to identify and avoid triggers.  - This can be a simple note where you log when you had a headache, associated symptoms, and medications used - There are several smartphone apps developed to help track migraines: Migraine Buddy, Migraine Monitor, Curelator N1-Headache App   Common triggers include: Emotional triggers: Emotional/Upset family or friends Emotional/Upset occupation Business reversal/success Anticipation anxiety Crisis-serious Post-crisis periodNew job/position   Physical triggers: Vacation Day Weekend Strenuous Exercise High Altitude Location New Move Menstrual Day Physical Illness Oversleep/Not enough sleep Weather changes Light: Photophobia or light sesnitivity treatment involves a balance between desensitization and reduction in  overly strong input. Use dark polarized glasses outside, but not inside. Avoid bright or fluorescent light, but do not dim environment to the point that going into a normally lit room hurts. Consider  FL-41 tint lenses, which reduce the most irritating wavelengths without blocking too much light.  These can be obtained at axonoptics.com or theraspecs.com Foods: see list above.   2. Limit use of acute treatments (over-the-counter medications, triptans, etc.) to no more than 2 days per week or 10 days per month to prevent medication overuse headache (rebound headache).     3. Follow a regular schedule (including weekends and holidays): Don't skip meals. Eat a balanced diet. 8 hours of sleep nightly. Minimize stress. Exercise 30 minutes per day. Being overweight is associated with a 5 times increased risk of chronic migraine. Keep well hydrated and drink 6-8 glasses of water per day.   4. Initiate non-pharmacologic measures at the earliest onset of your headache. Rest and quiet environment. Relax and reduce stress. Breathe2Relax is a free app that can instruct you on    some simple relaxtion and breathing techniques. Http://Dawnbuse.com is a    free website that provides teaching videos on relaxation.  Also, there are  many apps that   can be downloaded for "mindful" relaxation.  An app called YOGA NIDRA will help walk you through mindfulness. Another app called Calm can be downloaded to give you a structured mindfulness guide with daily reminders and skill development. Headspace for guided meditation Mindfulness Based Stress Reduction Online Course: www.palousemindfulness.com Cold compresses.   5. Don't wait!! Take the maximum allowable dosage of prescribed medication at the first sign of migraine.   6. Compliance:  Take prescribed medication regularly as directed and at the first sign of a migraine.   7. Communicate:  Call your physician when problems arise, especially if your headaches change, increase in frequency/severity, or become associated with neurological symptoms (weakness, numbness, slurred speech, etc.). Proceed to emergency room if you experience new or worsening symptoms or  symptoms do not resolve, if you have new neurologic symptoms or if headache is severe, or for any concerning symptom.   8. Headache/pain management therapies: Consider various complementary methods, including medication, behavioral therapy, psychological counselling, biofeedback, massage therapy, acupuncture, dry needling, and other modalities.  Such measures may reduce the need for medications. Counseling for pain management, where patients learn to function and ignore/minimize their pain, seems to work very well.   9. Recommend changing family's attention and focus away from patient's headaches. Instead, emphasize daily activities. If first question of day is 'How are your headaches/Do you have a headache today?', then patient will constantly think about headaches, thus making them worse. Goal is to re-direct attention away from headaches, toward daily activities and other distractions.   10. Helpful Websites: www.AmericanHeadacheSociety.org PatentHood.ch www.headaches.org TightMarket.nl www.achenet.org
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
## 2023-06-21 ENCOUNTER — Telehealth: Payer: Self-pay | Admitting: Neurology

## 2023-06-21 ENCOUNTER — Telehealth: Payer: BC Managed Care – PPO | Admitting: Physician Assistant

## 2023-06-21 ENCOUNTER — Encounter: Payer: Self-pay | Admitting: Family Medicine

## 2023-06-21 ENCOUNTER — Telehealth (INDEPENDENT_AMBULATORY_CARE_PROVIDER_SITE_OTHER): Payer: BC Managed Care – PPO | Admitting: Family Medicine

## 2023-06-21 ENCOUNTER — Encounter: Payer: Self-pay | Admitting: Physician Assistant

## 2023-06-21 DIAGNOSIS — G43009 Migraine without aura, not intractable, without status migrainosus: Secondary | ICD-10-CM

## 2023-06-21 DIAGNOSIS — B9689 Other specified bacterial agents as the cause of diseases classified elsewhere: Secondary | ICD-10-CM

## 2023-06-21 DIAGNOSIS — R6889 Other general symptoms and signs: Secondary | ICD-10-CM

## 2023-06-21 DIAGNOSIS — R202 Paresthesia of skin: Secondary | ICD-10-CM | POA: Diagnosis not present

## 2023-06-21 MED ORDER — FLUTICASONE PROPIONATE 50 MCG/ACT NA SUSP
2.0000 | Freq: Every day | NASAL | 0 refills | Status: AC
Start: 2023-06-21 — End: ?

## 2023-06-21 MED ORDER — BENZONATATE 100 MG PO CAPS
100.0000 mg | ORAL_CAPSULE | Freq: Three times a day (TID) | ORAL | 0 refills | Status: DC | PRN
Start: 2023-06-21 — End: 2023-10-23

## 2023-06-21 MED ORDER — GABAPENTIN 100 MG PO CAPS: 100.0000 mg | ORAL_CAPSULE | Freq: Three times a day (TID) | ORAL | 3 refills | Status: DC

## 2023-06-21 MED ORDER — TOPIRAMATE 50 MG PO TABS: 50.0000 mg | ORAL_TABLET | Freq: Every day | ORAL | 3 refills | Status: DC

## 2023-06-21 MED ORDER — LEVOCETIRIZINE DIHYDROCHLORIDE 5 MG PO TABS: 5.0000 mg | ORAL_TABLET | Freq: Every evening | ORAL | 0 refills | Status: DC

## 2023-06-21 MED ORDER — VERAPAMIL HCL 80 MG PO TABS: 80.0000 mg | ORAL_TABLET | Freq: Two times a day (BID) | ORAL | 3 refills | Status: DC

## 2023-06-21 MED ORDER — NURTEC 75 MG PO TBDP: 75.0000 mg | ORAL_TABLET | Freq: Every day | ORAL | 11 refills | Status: DC | PRN

## 2023-06-21 NOTE — Patient Instructions (Signed)
[name], thank you for joining [name], PA-C for today's virtual visit.  While this provider is not your primary care provider (PCP), if your PCP is located in our provider database this encounter information will be shared with them immediately following your visit.   A [facility] MyChart account gives you access to today's visit and all your visits, tests, and labs performed at [facility] " click here if you don't have a [facility] MyChart account or go to [email or web address]  Consent: (Patient) Brittney Johns provided verbal consent for this virtual visit at the beginning of the encounter.  Current Medications:  Current Outpatient Medications:    Biotin 5000 MCG CAPS, Take by mouth., Disp: , Rfl:    citalopram (CELEXA) 20 MG tablet, Take 1 tablet (20 mg total) by mouth daily., Disp: 90 tablet, Rfl: 1   estradiol (ESTRACE) 1 MG tablet, TAKE 1 AND 1/2 TABLETS(1.5 MG) BY MOUTH DAILY, Disp: 135 tablet, Rfl: 0   fluticasone (FLONASE) 50 MCG/ACT nasal spray, Place 2 sprays into both nostrils daily., Disp: 16 g, Rfl: 6   gabapentin (NEURONTIN) 100 MG capsule, Take 1 capsule (100 mg total) by mouth 3 (three) times daily., Disp: 270 capsule, Rfl: 3   imipramine (TOFRANIL) 25 MG tablet, Take 25 mg by mouth daily. , Disp: , Rfl:    lubiprostone (AMITIZA) 8 MCG capsule, Take 1 capsule (8 mcg total) by mouth 2 (two) times daily with a meal., Disp: 60 capsule, Rfl: 0   methocarbamol (ROBAXIN) 750 MG tablet, Take 750 mg by mouth 3 (three) times daily., Disp: , Rfl:    Multiple Vitamins-Minerals (MULTIVITAMIN WITH MINERALS) tablet, Take 1 tablet by mouth daily., Disp: , Rfl:    polyethylene glycol powder (GLYCOLAX/MIRALAX) 17 GM/SCOOP powder, Take 17 g by mouth daily., Disp: 3350 g, Rfl: 0   Rimegepant Sulfate (NURTEC) 75 MG TBDP, Take 75 mg by mouth daily as needed (take for abortive therapy of migraine, no more than 1 tablet in 24 hours or 10 per month).,  Disp: 8 tablet, Rfl: 11   tirzepatide (ZEPBOUND) 7.5 MG/0.5ML Pen, Inject 7.5 mg into the skin once a week., Disp: 2 mL, Rfl: 0   topiramate (TOPAMAX) 50 MG tablet, Take 1 tablet (50 mg total) by mouth daily. TAKE 1 TABLET BY MOUTH DAILY., Disp: 90 tablet, Rfl: 3   traMADol (ULTRAM) 50 MG tablet, tramadol 50 mg tablet  TAKE 1 TABLET BY MOUTH TWICE DAILY AS NEEDED, Disp: , Rfl:    verapamil (CALAN) 80 MG tablet, Take 1 tablet (80 mg total) by mouth 2 (two) times daily. TAKE 1 TABLET (80 MG TOTAL) BY MOUTH 2 (TWO) TIMES DAILY., Disp: 180 tablet, Rfl: 3   Medications ordered in this encounter:  No orders of the defined types were placed in this encounter.    *If you need refills on other medications prior to your next appointment, please contact your pharmacy*  Follow-Up: Call back or seek an in-person evaluation if the symptoms worsen or if the condition fails to improve as anticipated.  [facility] [phone]  Other Instructions Repeat test for COVID 24-36 hours after your initial test. Message [place] with the results so we can make adjustments if needed.  For now, increase fluids and rest. Tylenol for headache, fever, body aches. Continue use of your prescribed Diclofenac.  Mucinex-DM OTC for congestion and cough. Start the prescribed medications as directed. Keep home until we make sure repeat test is negative, you  are fever-free for 24 hours and symptoms improving. If anything acutely worsens, please seek an in-person evaluation ASAP.   If you have been instructed to have an in-person evaluation today at a local Urgent Care facility, please use the link below. It will take you to a list of all of our available [facility], including address, phone number and hours of operation. Please do not delay care.  [facility]  If you or a family member do not have a primary care provider, use the link below to schedule a visit and establish care. When you  choose a [facility] primary care physician or advanced practice provider, you gain a long-term partner in health. Find a Primary Care Provider  Learn more about [facility]'s in-office and virtual care options: [facility] Get [facility] Now

## 2023-06-21 NOTE — Telephone Encounter (Signed)
..   Pt understands that although there may be some limitations with this type of visit, we will take all precautions to reduce any security or privacy concerns.  Pt understands that this will be treated like an in office visit and we will file with pt's insurance, and there may be a patient responsible charge related to this service. ? ?

## 2023-06-21 NOTE — Progress Notes (Signed)
Virtual Visit Consent   Brittney Johns, you are scheduled for a virtual visit with a [facility] provider today. Just as with appointments in the office, your consent must be obtained to participate. Your consent will be active for this visit and any virtual visit you may have with one of our providers in the next 365 days. If you have a MyChart account, a copy of this consent can be sent to you electronically.  As this is a virtual visit, video technology does not allow for your provider to perform a traditional examination. This may limit your provider's ability to fully assess your condition. If your provider identifies any concerns that need to be evaluated in person or the need to arrange testing (such as labs, EKG, etc.), we will make arrangements to do so. Although advances in technology are sophisticated, we cannot ensure that it will always work on either your end or our end. If the connection with a video visit is poor, the visit may have to be switched to a telephone visit. With either a video or telephone visit, we are not always able to ensure that we have a secure connection.  By engaging in this virtual visit, you consent to the provision of healthcare and authorize for your insurance to be billed (if applicable) for the services provided during this visit. Depending on your insurance coverage, you may receive a charge related to this service.  I need to obtain your verbal consent now. Are you willing to proceed with your visit today? Brittney Johns has provided verbal consent on 06/21/2023 for a virtual visit (video or telephone). Brittney Johns, [place]  Date: 06/21/2023 8:29 AM  Virtual Visit via Video Note   I, Brittney Johns, connected with  Brittney Johns  ([id number], 06/07/1971) on 06/21/23 at  8:00 AM EDT by a video-enabled telemedicine application and verified that I am speaking with the correct person using two identifiers.  Location: Patient: Virtual Visit  Location Patient: Home Provider: Virtual Visit Location Provider: Home Office   I discussed the limitations of evaluation and management by telemedicine and the availability of in person appointments. The patient expressed understanding and agreed to proceed.    History of Present Illness: Brittney Johns is a 52 y.o. who identifies as a female who was assigned female at birth, and is being seen today for flu like symptoms over the past few days.  Sunday 3.5 days ago -- joint pain/aches -- elbows, knees, back. Chills developing after but without fever.  Monday AM -- felt back to normal except for soreness in her elbows Tuesday -- aches reoccurred with substantial chills and aches Wednesday -- aches continued but milder but development of cough and sore throat, denies odynophagia. Development of sweats. Still without fever.  Took home COVID test which was negative. Symptoms have persisted. Today -- sore throat, deeper cough, body aches.   Denies GI symptoms.  Denies SOB.  OTC -- Nothing until last night. Took some Mucinex Cold and cough.   HPI: HPI  Problems:  Patient Active Problem List   Diagnosis Date Noted   Paresthesia 05/03/2023   Left lumbar radiculopathy 05/03/2023   Abnormal TSH 10/20/2022   Lumbosacral spondylosis without myelopathy 10/02/2022   Trochanteric bursitis of right hip 06/06/2022   Moderate episode of recurrent major depressive disorder (HCC) 03/23/2022   Prediabetes 11/12/2020   Allergy 08/13/2020   Class 1 obesity with body mass index (BMI) of 32.0 to 32.9 in adult 09/20/2018  Family history of colon cancer requiring screening colonoscopy    Status post vaginal hysterectomy 04/11/2016   Anxiety 04/11/2016   Allergic rhinitis 12/31/2015   Constipation 12/23/2015   Surgical menopause 04/07/2015   Chronic right hip pain 04/05/2015   Endometriosis    IBS (irritable bowel syndrome)    Migraine without aura 10/17/2013   Headache 10/17/2013   FOM  (frequency of micturition) 04/24/2013   Incomplete bladder emptying 04/24/2013   Chronic interstitial cystitis 04/24/2013    Allergies:  Allergies  Allergen Reactions   Imitrex [Sumatriptan] Anaphylaxis   Medications:  Current Outpatient Medications:    benzonatate (TESSALON) 100 MG capsule, Take 1 capsule (100 mg total) by mouth 3 (three) times daily as needed for cough., Disp: 30 capsule, Rfl: 0   fluticasone (FLONASE) 50 MCG/ACT nasal spray, Place 2 sprays into both nostrils daily., Disp: 16 g, Rfl: 0   gabapentin (NEURONTIN) 300 MG capsule, Take 300 mg by mouth 3 (three) times daily., Disp: , Rfl:    levocetirizine (XYZAL ALLERGY 24HR) 5 MG tablet, Take 1 tablet (5 mg total) by mouth every evening., Disp: 30 tablet, Rfl: 0   Biotin 5000 MCG CAPS, Take by mouth., Disp: , Rfl:    citalopram (CELEXA) 20 MG tablet, Take 1 tablet (20 mg total) by mouth daily., Disp: 90 tablet, Rfl: 1   estradiol (ESTRACE) 1 MG tablet, TAKE 1 AND 1/2 TABLETS(1.5 MG) BY MOUTH DAILY, Disp: 135 tablet, Rfl: 0   imipramine (TOFRANIL) 25 MG tablet, Take 25 mg by mouth daily. , Disp: , Rfl:    lubiprostone (AMITIZA) 8 MCG capsule, Take 1 capsule (8 mcg total) by mouth 2 (two) times daily with a meal., Disp: 60 capsule, Rfl: 0   methocarbamol (ROBAXIN) 750 MG tablet, Take 750 mg by mouth 3 (three) times daily., Disp: , Rfl:    Multiple Vitamins-Minerals (MULTIVITAMIN WITH MINERALS) tablet, Take 1 tablet by mouth daily., Disp: , Rfl:    polyethylene glycol powder (GLYCOLAX/MIRALAX) 17 GM/SCOOP powder, Take 17 g by mouth daily., Disp: 3350 g, Rfl: 0   Rimegepant Sulfate (NURTEC) 75 MG TBDP, Take 75 mg by mouth daily as needed (take for abortive therapy of migraine, no more than 1 tablet in 24 hours or 10 per month)., Disp: 8 tablet, Rfl: 11   topiramate (TOPAMAX) 50 MG tablet, Take 1 tablet (50 mg total) by mouth daily. TAKE 1 TABLET BY MOUTH DAILY., Disp: 90 tablet, Rfl: 3   traMADol (ULTRAM) 50 MG tablet, tramadol 50  mg tablet  TAKE 1 TABLET BY MOUTH TWICE DAILY AS NEEDED, Disp: , Rfl:    verapamil (CALAN) 80 MG tablet, Take 1 tablet (80 mg total) by mouth 2 (two) times daily. TAKE 1 TABLET (80 MG TOTAL) BY MOUTH 2 (TWO) TIMES DAILY., Disp: 180 tablet, Rfl: 3  Observations/Objective: Patient is well-developed, well-nourished in no acute distress.  Resting comfortably  at home.  Head is normocephalic, atraumatic.  No labored breathing. Speech is clear and coherent with logical content.  Patient is alert and oriented at baseline.  Assessment and Plan: 1. Flu-like symptoms - benzonatate (TESSALON) 100 MG capsule; Take 1 capsule (100 mg total) by mouth 3 (three) times daily as needed for cough.  Dispense: 30 capsule; Refill: 0 - fluticasone (FLONASE) 50 MCG/ACT nasal spray; Place 2 sprays into both nostrils daily.  Dispense: 16 g; Refill: 0 - levocetirizine (XYZAL ALLERGY 24HR) 5 MG tablet; Take 1 tablet (5 mg total) by mouth every evening.  Dispense: 30 tablet;  Refill: 0  Concern for influenza versus COVID. Initial COVID test was negative but want her to repeat to be cautious and as she is still in time frame for COVID antiviral. Unfortunately outside the window for flu antiviral. Supportive measures and OTC Medications reviewed. Xyzal, Flonase and Tessalon per orders. She is to message [place] when she has the results from her repeat COVID test.   Follow Up Instructions: I discussed the assessment and treatment plan with the patient. The patient was provided an opportunity to ask questions and all were answered. The patient agreed with the plan and demonstrated an understanding of the instructions.  A copy of instructions were sent to the patient via MyChart unless otherwise noted below.    The patient was advised to call back or seek an in-person evaluation if the symptoms worsen or if the condition fails to improve as anticipated.  Time:  I spent 10 minutes with the patient via telehealth technology  discussing the above problems/concerns.    Brittney Climes, PA-C

## 2023-06-22 MED ORDER — AZITHROMYCIN 250 MG PO TABS
ORAL_TABLET | ORAL | 0 refills | Status: AC
Start: 2023-06-22 — End: 2023-06-27

## 2023-06-22 NOTE — Addendum Note (Signed)
Addended by: Margaretann Loveless on: 06/22/2023 09:29 AM   Modules accepted: Orders
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
## 2023-06-26 ENCOUNTER — Encounter: Payer: Self-pay | Admitting: Physician Assistant

## 2023-06-26 ENCOUNTER — Ambulatory Visit: Payer: BC Managed Care – PPO | Admitting: Physician Assistant

## 2023-06-26 VITALS — BP 113/73 | HR 92 | Temp 97.6°F | Ht 67.0 in | Wt 206.6 lb

## 2023-06-26 DIAGNOSIS — Z800 Family history of malignant neoplasm of digestive organs: Secondary | ICD-10-CM | POA: Diagnosis not present

## 2023-06-26 DIAGNOSIS — K581 Irritable bowel syndrome with constipation: Secondary | ICD-10-CM

## 2023-06-26 DIAGNOSIS — K5904 Chronic idiopathic constipation: Secondary | ICD-10-CM

## 2023-06-26 MED ORDER — PEG 3350-KCL-NA BICARB-NACL 420 G PO SOLR: 4000.0000 mL | Freq: Once | ORAL | 0 refills | Status: AC

## 2023-06-26 MED ORDER — LUBIPROSTONE 24 MCG PO CAPS
24.0000 ug | ORAL_CAPSULE | Freq: Two times a day (BID) | ORAL | 12 refills | Status: DC
Start: 2023-06-26 — End: 2023-10-23

## 2023-06-27 ENCOUNTER — Ambulatory Visit (INDEPENDENT_AMBULATORY_CARE_PROVIDER_SITE_OTHER): Payer: BC Managed Care – PPO | Admitting: Neurology

## 2023-06-27 ENCOUNTER — Ambulatory Visit: Payer: Self-pay | Admitting: Neurology

## 2023-06-27 VITALS — BP 117/79 | HR 87 | Ht 67.0 in | Wt 200.0 lb

## 2023-06-27 DIAGNOSIS — G43009 Migraine without aura, not intractable, without status migrainosus: Secondary | ICD-10-CM

## 2023-06-27 DIAGNOSIS — Z0289 Encounter for other administrative examinations: Secondary | ICD-10-CM

## 2023-06-27 DIAGNOSIS — R202 Paresthesia of skin: Secondary | ICD-10-CM

## 2023-06-27 DIAGNOSIS — M5416 Radiculopathy, lumbar region: Secondary | ICD-10-CM

## 2023-07-16 NOTE — Procedures (Signed)
Full Name: Brittney Johns Gender: Female MRN #: [id number] Date of Birth: 06-28-1971    Visit Date: 06/27/2023 07:29 Age: 52 Years Examining Physician: Dr. Levert Feinstein Referring Physician: Dr. Levert Feinstein Height: 5 feet 7 inch History: 52 year old female, complains of intermittent upper and lower extremity paresthesia  Summary of the test:  Nerve conduction study: Left sural, superficial peroneal sensory responses were normal.  Left tibial, peroneal to EDB motor responses were normal.  Bilateral ulnar sensory and motor responses were normal.  Bilateral median sensory responses showed moderately prolonged peak latency with mildly decreased snap amplitude.  Bilateral median motor responses showed mildly prolonged distal latency, with normal CMAP amplitude.  Electromyography:  Selected needle examinations of right upper and lower extremity muscles, cervical and lumbar paraspinal muscles were normal.  Conclusion: This is a mild abnormal study.  There is electrodiagnostic evidence of bilateral median neuropathy across the wrist, consistent with moderate bilateral carpal tunnel syndromes.  There is no evidence of right cervical radiculopathy or large fiber peripheral neuropathy.    ------------------------------- Levert Feinstein M.D. PhD  [facility] [address] [place], [place] [address] Tel: [phone] Fax: [phone]  Verbal informed consent was obtained from the patient, patient was informed of potential risk of procedure, including bruising, bleeding, hematoma formation, infection, muscle weakness, muscle pain, numbness, among others.        MNC    Nerve / Sites Muscle Latency Ref. Amplitude Ref. Rel Amp Segments Distance Velocity Ref. Area    ms ms mV mV %  cm m/s m/s mVms  R Median - APB     Wrist APB 4.8 <=4.4 9.8 >=4.0 100 Wrist - APB 7   42.6     Upper arm APB 9.0  9.7  99.8 Upper arm - Wrist 23.6 55 >=49 43.0  L Median - APB      Wrist APB 5.1 <=4.4 5.5 >=4.0 100 Wrist - APB 7   27.0     Upper arm APB 9.6  5.2  93.5 Upper arm - Wrist 24 53 >=49 24.9  R Ulnar - ADM     Wrist ADM 3.0 <=3.3 11.8 >=6.0 100 Wrist - ADM 7   54.6     B.Elbow ADM 5.0  11.3  95.5 B.Elbow - Wrist 11 56 >=49 53.2     A.Elbow ADM 7.7  11.4  101 A.Elbow - B.Elbow 15 55 >=49 53.8  L Ulnar - ADM     Wrist ADM 3.1 <=3.3 9.9 >=6.0 100 Wrist - ADM 7   44.0     B.Elbow ADM 5.1  9.9  100 B.Elbow - Wrist 11 57 >=49 43.7     A.Elbow ADM 7.5  9.8  98.8 A.Elbow - B.Elbow 15 61 >=49 44.3  L Peroneal - EDB     Ankle EDB 5.1 <=6.5 6.1 >=2.0 100 Ankle - EDB 9   20.0     Fib head EDB 11.2  5.4  88.6 Fib head - Ankle 28.6 47 >=44 20.3     Pop fossa EDB 13.4  5.2  97.6 Pop fossa - Fib head 12 56 >=44 19.7         Pop fossa - Ankle      L Tibial - AH     Ankle AH 4.2 <=5.8 7.3 >=4.0 100 Ankle - AH 9   28.0     Pop fossa AH 12.6  7.3  100 Pop fossa - Ankle 38 45 >=  41 28.8                 SNC    Nerve / Sites Rec. Site Peak Lat Ref.  Amp Ref. Segments Distance Peak Diff Ref.    ms ms V V  cm ms ms  L Sural - Ankle (Calf)     Calf Ankle 3.9 <=4.4 15 >=6 Calf - Ankle 14    L Superficial peroneal - Ankle     Lat leg Ankle 4.0 <=4.4 8 >=6 Lat leg - Ankle 14    R Median, Ulnar - Transcarpal comparison     Median Palm Wrist 3.6 <=2.2 8 >=35 Median Palm - Wrist 8       Ulnar Palm Wrist 2.1 <=2.2 12 >=12 Ulnar Palm - Wrist 8          Median Palm - Ulnar Palm  1.5 <=0.4  L Median, Ulnar - Transcarpal comparison     Median Palm Wrist 3.2 <=2.2 28 >=35 Median Palm - Wrist 8       Ulnar Palm Wrist 2.4 <=2.2 17 >=12 Ulnar Palm - Wrist 8          Median Palm - Ulnar Palm  0.8 <=0.4  R Median - Orthodromic (Dig II, Mid palm)     Dig II Wrist 4.8 <=3.4 5 >=10 Dig II - Wrist 13    L Median - Orthodromic (Dig II, Mid palm)     Dig II Wrist 4.6 <=3.4 6 >=10 Dig II - Wrist 13    R Ulnar - Orthodromic, (Dig V, Mid palm)     Dig V Wrist 3.0 <=3.1 8 >=5 Dig V - Wrist 11     L Ulnar - Orthodromic, (Dig V, Mid palm)     Dig V Wrist 3.3 <=3.1 8 >=5 Dig V - Wrist 82                       F  Wave    Nerve F Lat Ref.   ms ms  L Tibial - AH 49.6 <=56.0  R Ulnar - ADM 26.3 <=32.0  L Ulnar - ADM 27.2 <=32.0           H Reflex    Nerve H Lat Lat Hmax   ms ms   Left Right Ref. Left Right Ref.  Tibial - Soleus 42.3 43.4 <=35.0 38.5 19.9 <=35.0         EMG Summary Table    Spontaneous MUAP Recruitment  Muscle IA Fib PSW Fasc Other Amp Dur. Poly Pattern  R. First dorsal interosseous Normal None None None _______ Normal Normal Normal Normal  R. Abductor pollicis brevis Normal None None None _______ Normal Normal Normal Normal  R. Pronator teres Normal None None None _______ Normal Normal Normal Normal  R. Biceps brachii Normal None None None _______ Normal Normal Normal Normal  R. Deltoid Normal None None None _______ Normal Normal Normal Normal  R. Triceps brachii Normal None None None _______ Normal Normal Normal Normal  R. Cervical paraspinals Normal None None None _______ Normal Normal Normal Normal  L. Abductor pollicis brevis Normal None None None _______ Normal Normal Normal Normal  R. Tibialis anterior Normal None None None _______ Normal Normal Normal Normal  R. Tibialis posterior Normal None None None _______ Normal Normal Normal Normal  R. Peroneus longus Normal None None None _______ Normal Normal Normal Normal  R. Gastrocnemius (Medial head) Normal None None None _______  Normal Normal Normal Normal  L. Tibialis anterior Normal None None None _______ Normal Normal Normal Normal  L. Tibialis posterior Normal None None None _______ Normal Normal Normal Normal  L. Peroneus longus Normal None None None _______ Normal Normal Normal Normal  L. Gastrocnemius (Medial head) Normal None None None _______ Normal Normal Normal Normal  L. Vastus lateralis Normal None None None _______ Normal Normal Normal Normal  L. Lumbar paraspinals (low) Normal None None None  _______ Normal Normal Normal Normal  L. Lumbar paraspinals (mid) Normal None None None _______ Normal Normal Normal Normal  R. Lumbar paraspinals (low) Normal None None None _______ Normal Normal Normal Normal  R. Lumbar paraspinals (mid) Normal None None None _______ Normal Normal Normal Normal

## 2023-07-18 ENCOUNTER — Other Ambulatory Visit: Payer: Self-pay | Admitting: Obstetrics and Gynecology

## 2023-07-18 DIAGNOSIS — Z01419 Encounter for gynecological examination (general) (routine) without abnormal findings: Secondary | ICD-10-CM

## 2023-07-23 ENCOUNTER — Telehealth: Payer: Self-pay

## 2023-07-23 NOTE — Telephone Encounter (Signed)
Pt is here in office need to r/s procedure. PT is having back surgery and need to r/s procedure. Please call [phone]

## 2023-07-23 NOTE — Telephone Encounter (Signed)
Spoke with patient-she is scheduled to have back surgery in November 2024 and was advised to hold off on Colonoscopy until after her surgery- I let the patient know we can cancel the colonoscopy and once she is cleared she can give [place] a call back to reschedule. Spoke with [facility] to cancel.

## 2023-07-28 ENCOUNTER — Other Ambulatory Visit: Payer: Self-pay

## 2023-07-28 ENCOUNTER — Encounter: Payer: Self-pay | Admitting: *Deleted

## 2023-07-28 ENCOUNTER — Ambulatory Visit
Admission: EM | Admit: 2023-07-28 | Discharge: 2023-07-28 | Disposition: A | Discharge status: Home / Self Care | Payer: BC Managed Care – PPO | Attending: Emergency Medicine | Admitting: Emergency Medicine

## 2023-07-28 DIAGNOSIS — J069 Acute upper respiratory infection, unspecified: Secondary | ICD-10-CM | POA: Diagnosis not present

## 2023-07-28 HISTORY — DX: Dorsalgia, unspecified: M54.9

## 2023-07-28 LAB — POCT RAPID STREP A (OFFICE): Rapid Strep A Screen: NEGATIVE

## 2023-07-28 MED ORDER — LIDOCAINE VISCOUS HCL 2 % MT SOLN: 15.0000 mL | OROMUCOSAL | 0 refills | Status: DC | PRN

## 2023-07-28 NOTE — ED Triage Notes (Addendum)
C/O hoarse voice and sore throat onset yesterday. Denies rhinorrhea or congestion. No known fevers. Took Nyquil last night
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
## 2023-07-28 NOTE — Discharge Instructions (Addendum)
Your symptoms today are most likely being caused by a virus and should steadily improve in time it can take up to 7 to 10 days before you truly start to see a turnaround however things will get better  Rapid strep test is negative for bacteria  You may gargle and spit lidocaine solution which will temporarily numb your throat and provide relief    You can take Tylenol and/or Ibuprofen as needed for fever reduction and pain relief.   For cough: honey 1/2 to 1 teaspoon (you can dilute the honey in water or another fluid).  You can also use guaifenesin and dextromethorphan for cough. You can use a humidifier for chest congestion and cough.  If you don't have a humidifier, you can sit in the bathroom with the hot shower running.      For sore throat: try warm salt water gargles, cepacol lozenges, throat spray, warm tea or water with lemon/honey, popsicles or ice, or OTC cold relief medicine for throat discomfort.   For congestion: take a daily anti-histamine like Zyrtec, Claritin, and a oral decongestant, such as pseudoephedrine.  You can also use Flonase 1-2 sprays in each nostril daily.   It is important to stay hydrated: drink plenty of fluids (water, gatorade/powerade/pedialyte, juices, or teas) to keep your throat moisturized and help further relieve irritation/discomfort.

## 2023-08-03 ENCOUNTER — Ambulatory Visit: Admit: 2023-08-03 | Payer: BC Managed Care – PPO | Admitting: Gastroenterology

## 2023-08-03 SURGERY — COLONOSCOPY WITH PROPOFOL
Anesthesia: General

## 2023-08-06 ENCOUNTER — Other Ambulatory Visit: Payer: Self-pay | Admitting: Nurse Practitioner

## 2023-08-06 DIAGNOSIS — F331 Major depressive disorder, recurrent, moderate: Secondary | ICD-10-CM

## 2023-08-07 NOTE — Telephone Encounter (Signed)
Requested Prescriptions  Pending Prescriptions Disp Refills   citalopram (CELEXA) 20 MG tablet [Pharmacy Med Name: CITALOPRAM 20MG  TABLETS] 90 tablet 1    Sig: TAKE 1 TABLET(20 MG) BY MOUTH DAILY     Psychiatry:  Antidepressants - SSRI Passed - 08/06/2023  8:11 AM      Passed - Completed PHQ-2 or PHQ-9 in the last 360 days      Passed - Valid encounter within last 6 months    Recent Outpatient Visits           2 months ago Constipation, unspecified constipation type   [facility] Berniece Salines, FNP   6 months ago Generalized body aches   [facility] Della Goo F, FNP   9 months ago Acute non-recurrent frontal sinusitis   [facility] Della Goo F, FNP   1 year ago Class 1 obesity with body mass index (BMI) of 32.0 to 32.9 in adult, unspecified obesity type, unspecified whether serious comorbidity present   [facility] Della Goo F, FNP   1 year ago Moderate episode of recurrent major depressive disorder [facility])   [facility] Berniece Salines, [place]

## 2023-09-19 DIAGNOSIS — R569 Unspecified convulsions: Secondary | ICD-10-CM

## 2023-09-19 HISTORY — DX: Unspecified convulsions: R56.9

## 2023-10-17 ENCOUNTER — Ambulatory Visit: Payer: Self-pay

## 2023-10-17 NOTE — Telephone Encounter (Signed)
Message from [place] B sent at 10/17/2023  2:39 PM EST  Summary: DIZZINESS - new symptom   Patient states she feels nausea and dizzy         Chief Complaint: 3- 4 episodes, dizziness that last 10 to 15 minutes,accompanied by  Symptoms: nausea, sweating, vertigo, numbness to bilateral lower legs and both hands, weakness , pt sounds very weak during call, blurred vision yesterday with watery eyes. Pt staetd she smells strong odor of blood w/episodes, also had stomach pain Frequency: 1 episode 09/22/23, the Monday and today were severe.  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 She said the room was spinning and had all above sx. Called office and discussed  with Asher Muir who agreed with ED disposition.  Reason for Disposition  Patient sounds very sick or weak to the triager  Answer Assessment - Initial Assessment Questions 1. DESCRIPTION: "Describe your dizziness."     Off balance 2. LIGHTHEADED: "Do you feel lightheaded?" (e.g., somewhat faint, woozy, weak upon standing)     Weakness during episode 3. VERTIGO: "Do you feel like either you or the room is spinning or tilting?" (i.e. vertigo)     Monday yes 09/22/23- sudden nausea, dizziness, sweating 4. SEVERITY: "How bad is it?"  "Do you feel like you are going to faint?" "Can you stand and walk?"   - MILD: Feels slightly dizzy, but walking normally.   - MODERATE: Feels unsteady when walking, but not falling; interferes with normal activities (e.g., school, work).   - SEVERE: Unable to walk without  falling, or requires assistance to walk without falling; feels like passing out now.      Episodes severe dizziness 5. ONSET:  "When did the dizziness begin?"     09/22/23, Monday and today  6. AGGRAVATING FACTORS: "Does anything make it worse?" (e.g., standing, change in head position)     Out of balance  7. HEART RATE: "Can you tell me your heart rate?" "How many beats in 15 seconds?"  (Note: not all patients can do this)       85  was 102 pbpm before that 8. CAUSE: "What do you think is causing the dizziness?"     unsure 9. RECURRENT SYMPTOM: "Have you had dizziness before?" If Yes, ask: "When was the last time?" "What happened that time?"     09/22/23 10. OTHER SYMPTOMS: "Do you have any other symptoms?" (e.g., fever, chest pain, vomiting, diarrhea, bleeding)       Yesterday eyes watery nausea last 10 min, blurred vision yesterday none today, smells blood, denies bleeding  Protocols used: Dizziness - Lightheadedness-A-AH

## 2023-10-18 ENCOUNTER — Telehealth: Payer: Self-pay

## 2023-10-18 ENCOUNTER — Ambulatory Visit (INDEPENDENT_AMBULATORY_CARE_PROVIDER_SITE_OTHER): Payer: 59 | Admitting: Nurse Practitioner

## 2023-10-18 ENCOUNTER — Encounter: Payer: Self-pay | Admitting: Nurse Practitioner

## 2023-10-18 ENCOUNTER — Other Ambulatory Visit: Payer: Self-pay | Admitting: Nurse Practitioner

## 2023-10-18 ENCOUNTER — Ambulatory Visit: Payer: Self-pay | Admitting: Nurse Practitioner

## 2023-10-18 ENCOUNTER — Ambulatory Visit
Admission: RE | Admit: 2023-10-18 | Discharge: 2023-10-18 | Disposition: A | Discharge status: Home / Self Care | Payer: 59 | Source: Ambulatory Visit | Attending: Nurse Practitioner | Admitting: Nurse Practitioner

## 2023-10-18 VITALS — BP 124/68 | HR 107 | Temp 97.6°F | Resp 18 | Ht 67.0 in | Wt 218.7 lb

## 2023-10-18 DIAGNOSIS — R42 Dizziness and giddiness: Secondary | ICD-10-CM

## 2023-10-18 DIAGNOSIS — H539 Unspecified visual disturbance: Secondary | ICD-10-CM | POA: Insufficient documentation

## 2023-10-18 DIAGNOSIS — R110 Nausea: Secondary | ICD-10-CM

## 2023-10-18 MED ORDER — MECLIZINE HCL 25 MG PO TABS: 25.0000 mg | ORAL_TABLET | Freq: Three times a day (TID) | ORAL | 0 refills | Status: DC | PRN

## 2023-10-18 NOTE — Progress Notes (Addendum)
BP 124/68   Pulse (!) 107   Temp 97.6 F (36.4 C)   Resp 18   Ht 5\' 7"  (1.702 m)   Wt 218 lb 11.2 oz (99.2 kg)   SpO2 100%   BMI 34.25 kg/m    Subjective:    Patient ID: Brittney Johns, female    DOB: April 08, 1971, 53 y.o.   MRN: [id number]  HPI: ZLATY ALEXA is a 53 y.o. female  Chief Complaint  Patient presents with   Dizziness    3 episodes over the last few weeks, sudden onset w/ nausea,smells blood, headache and numbness/burning feeling down bilateral legs. Had lumbar fusion on 08/07/23 but surgeon feels un related    Discussed the use of AI scribe software for clinical note transcription with the patient, who gave verbal consent to proceed.  History of Present Illness   The patient, with a history of  recent lumbar fusion, presents with recurrent episodes of dizziness, nausea, changes in vision and sensory changes. The episodes are characterized by a sensation of smelling blood, blurred vision,  lightheadedness, numbness from the waist down, and burning in the feet. The first episode occurred shortly after New Year's and was associated with profuse sweating and severe leg pain. The second episode occurred during a short walk, and the third episode occurred while sitting at a table. Each episode lasted approximately 30-40 minutes and was followed by a headache. The patient also reports a history of "mini strokes" associated with vascular migraines in the late 1990s.    She has a history of migraines but reports the headache she gets after the episode is different than her usual migraines.  Patient last episode was yesterday.  She did call the office and was advised to go to the er. She did not go because symptoms resolved.     05/28/2023    1:36 PM 01/15/2023    3:15 PM 10/20/2022    3:47 PM  Depression screen PHQ 2/9  Decreased Interest 0 0 0  Down, Depressed, Hopeless 0 0 0  PHQ - 2 Score 0 0 0  Altered sleeping 0 3 0  Tired, decreased energy 0 3 0  Change in  appetite 0 0 0  Feeling bad or failure about yourself  0 0 0  Trouble concentrating 0 0 0  Moving slowly or fidgety/restless 0 0 0  Suicidal thoughts 0 0 0  PHQ-9 Score 0 6 0  Difficult doing work/chores Not difficult at all Somewhat difficult     Relevant past medical, surgical, family and social history reviewed and updated as indicated. Interim medical history since our last visit reviewed. Allergies and medications reviewed and updated.  Review of Systems  Ten systems reviewed and is negative except as mentioned in HPI      Objective:    BP 124/68   Pulse (!) 107   Temp 97.6 F (36.4 C)   Resp 18   Ht 5\' 7"  (1.702 m)   Wt 218 lb 11.2 oz (99.2 kg)   SpO2 100%   BMI 34.25 kg/m    Wt Readings from Last 3 Encounters:  10/18/23 218 lb 11.2 oz (99.2 kg)  06/27/23 200 lb (90.7 kg)  06/26/23 206 lb 9.6 oz (93.7 kg)    Physical Exam Vitals reviewed.  Constitutional:      Appearance: Normal appearance.  HENT:     Head: Normocephalic.     Right Ear: Tympanic membrane normal.     Left Ear: Tympanic  membrane normal.  Cardiovascular:     Rate and Rhythm: Normal rate and regular rhythm.  Pulmonary:     Effort: Pulmonary effort is normal.     Breath sounds: Normal breath sounds.  Musculoskeletal:        General: Normal range of motion.  Skin:    General: Skin is warm and dry.  Neurological:     General: No focal deficit present.     Mental Status: She is alert and oriented to person, place, and time. Mental status is at baseline.     GCS: GCS eye subscore is 4. GCS verbal subscore is 5. GCS motor subscore is 6.     Cranial Nerves: Cranial nerves 2-12 are intact.     Sensory: Sensation is intact.     Motor: Motor function is intact.     Coordination: Coordination is intact. Romberg sign negative.  Psychiatric:        Mood and Affect: Mood normal.        Behavior: Behavior normal.        Thought Content: Thought content normal.        Judgment: Judgment normal.      Results for orders placed or performed during the hospital encounter of 07/28/23  POCT rapid strep A   Collection Time: 07/28/23  8:22 AM  Result Value Ref Range   Rapid Strep A Screen Negative Negative       Assessment & Plan:   Problem List Items Addressed This Visit   None Visit Diagnoses       Dizziness    -  Primary   Relevant Orders   CT HEAD WO CONTRAST ( )     Vision changes       Relevant Orders   CT HEAD WO CONTRAST ( )     Nausea       Relevant Orders   CT HEAD WO CONTRAST ( )        Assessment and Plan    Post Lumbar Fusion Reports of leg swelling, tingling in feet, and muscle adjustment around the spine. No major complications reported post-surgery. -Continue with recommended walking exercises as tolerated.  Transient Neurological Symptoms Reports of episodic dizziness, blurred vision, nausea, and a sensation of smelling blood. Symptoms resolve spontaneously. History of vascular migraines and mini strokes. -Order a head CT to rule out serious neurological conditions such as transient ischemic attack or stroke.        Follow up plan: Return if symptoms worsen or fail to improve.

## 2023-10-18 NOTE — Progress Notes (Deleted)
   There were no vitals taken for this visit.   Subjective:    Patient ID: Brittney Johns, female    DOB: 01/22/1971, 53 y.o.   MRN: [id number]  HPI: Brittney Johns is a 53 y.o. female  No chief complaint on file.   Discussed the use of AI scribe software for clinical note transcription with the patient, who gave verbal consent to proceed.  History of Present Illness           05/28/2023    1:36 PM 01/15/2023    3:15 PM 10/20/2022    3:47 PM  Depression screen PHQ 2/9  Decreased Interest 0 0 0  Down, Depressed, Hopeless 0 0 0  PHQ - 2 Score 0 0 0  Altered sleeping 0 3 0  Tired, decreased energy 0 3 0  Change in appetite 0 0 0  Feeling bad or failure about yourself  0 0 0  Trouble concentrating 0 0 0  Moving slowly or fidgety/restless 0 0 0  Suicidal thoughts 0 0 0  PHQ-9 Score 0 6 0  Difficult doing work/chores Not difficult at all Somewhat difficult     Relevant past medical, surgical, family and social history reviewed and updated as indicated. Interim medical history since our last visit reviewed. Allergies and medications reviewed and updated.  Review of Systems  Per HPI unless specifically indicated above     Objective:    There were no vitals taken for this visit.  {Vitals History (Optional):23777} Wt Readings from Last 3 Encounters:  06/27/23 200 lb (90.7 kg)  06/26/23 206 lb 9.6 oz (93.7 kg)  05/28/23 199 lb (90.3 kg)    Physical Exam  Results for orders placed or performed during the hospital encounter of 07/28/23  POCT rapid strep A   Collection Time: 07/28/23  8:22 AM  Result Value Ref Range   Rapid Strep A Screen Negative Negative   {Labs (Optional):23779}    Assessment & Plan:   Problem List Items Addressed This Visit   None    Assessment and Plan             Follow up plan: No follow-ups on file.

## 2023-10-18 NOTE — Telephone Encounter (Signed)
Primary Diagnosis Code: R42  Description: Dizziness and giddiness  Secondary Diagnosis Code: H53.9  Description: Unspecified visual disturbance  Date of Service: Not provided    CPT Code: 82956  Description: CT HEAD/BRAIN W/O CONTRAST  Case Number: [id number]  Review Date: 10/18/2023 10:23:28 AM  Expiration Date: N/A  Status: This member does not require prior authorization for this procedure at this time. The member's employer group has opted out for the requested procedure, making them non-delegated through [employer].

## 2023-10-23 ENCOUNTER — Ambulatory Visit: Payer: 59 | Admitting: Neurology

## 2023-10-23 ENCOUNTER — Encounter: Payer: Self-pay | Admitting: Neurology

## 2023-10-23 VITALS — BP 131/87 | HR 99 | Ht 67.0 in | Wt 222.5 lb

## 2023-10-23 DIAGNOSIS — R410 Disorientation, unspecified: Secondary | ICD-10-CM | POA: Diagnosis not present

## 2023-10-23 DIAGNOSIS — G43009 Migraine without aura, not intractable, without status migrainosus: Secondary | ICD-10-CM

## 2023-10-23 DIAGNOSIS — R202 Paresthesia of skin: Secondary | ICD-10-CM | POA: Diagnosis not present

## 2023-10-23 MED ORDER — TOPIRAMATE 100 MG PO TABS: 100.0000 mg | ORAL_TABLET | Freq: Two times a day (BID) | ORAL | 11 refills | Status: DC
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
## 2023-10-25 ENCOUNTER — Telehealth: Payer: Self-pay | Admitting: Neurology

## 2023-10-25 ENCOUNTER — Other Ambulatory Visit: Payer: Self-pay | Admitting: Anesthesiology

## 2023-10-25 MED ORDER — TOPIRAMATE 100 MG PO TABS: 100.0000 mg | ORAL_TABLET | Freq: Two times a day (BID) | ORAL | 11 refills | Status: DC

## 2023-10-25 NOTE — Telephone Encounter (Signed)
 3 way called pharmacy w/pt online and they stated that the topirimate is ready for $5

## 2023-10-25 NOTE — Addendum Note (Signed)
 Addended by: Randi Buster on: 10/25/2023 04:58 PM   Modules accepted: Orders

## 2023-10-25 NOTE — Telephone Encounter (Signed)
 Pt states the pharmacy has informed her they have tried reaching out to our office because they do not understand the directions for pt's  topiramate  (TOPAMAX ) 100 MG tablet Rx.  Pt states this is something she needs, she is asking that the orders be resent to pharmacy or a call be made to pharmacy so she can get the medication

## 2023-10-30 ENCOUNTER — Ambulatory Visit: Payer: 59

## 2023-10-30 DIAGNOSIS — R410 Disorientation, unspecified: Secondary | ICD-10-CM | POA: Diagnosis not present

## 2023-10-30 DIAGNOSIS — R202 Paresthesia of skin: Secondary | ICD-10-CM

## 2023-10-30 DIAGNOSIS — G43009 Migraine without aura, not intractable, without status migrainosus: Secondary | ICD-10-CM

## 2023-10-30 MED ORDER — GADOBENATE DIMEGLUMINE 529 MG/ML IV SOLN
20.0000 mL | Freq: Once | INTRAVENOUS | Status: AC | PRN
  Administered 2023-10-30: 20 mL via INTRAVENOUS

## 2023-11-01 ENCOUNTER — Encounter: Payer: Self-pay | Admitting: Neurology

## 2023-11-07 ENCOUNTER — Encounter: Payer: Self-pay | Admitting: Neurology

## 2023-11-07 ENCOUNTER — Other Ambulatory Visit: Payer: 59 | Admitting: *Deleted

## 2023-11-08 ENCOUNTER — Telehealth: Payer: 59 | Admitting: Physician Assistant

## 2023-11-08 DIAGNOSIS — J069 Acute upper respiratory infection, unspecified: Secondary | ICD-10-CM | POA: Diagnosis not present

## 2023-11-08 DIAGNOSIS — T3695XA Adverse effect of unspecified systemic antibiotic, initial encounter: Secondary | ICD-10-CM

## 2023-11-08 DIAGNOSIS — B379 Candidiasis, unspecified: Secondary | ICD-10-CM | POA: Diagnosis not present

## 2023-11-08 DIAGNOSIS — B9689 Other specified bacterial agents as the cause of diseases classified elsewhere: Secondary | ICD-10-CM | POA: Diagnosis not present

## 2023-11-08 MED ORDER — FLUCONAZOLE 150 MG PO TABS: 150.0000 mg | ORAL_TABLET | ORAL | 0 refills | Status: AC | PRN

## 2023-11-08 MED ORDER — PROMETHAZINE-DM 6.25-15 MG/5ML PO SYRP: 5.0000 mL | ORAL_SOLUTION | Freq: Four times a day (QID) | ORAL | 0 refills | Status: DC | PRN

## 2023-11-08 MED ORDER — AMOXICILLIN-POT CLAVULANATE 875-125 MG PO TABS: 1.0000 | ORAL_TABLET | Freq: Two times a day (BID) | ORAL | 0 refills | Status: DC

## 2023-11-08 MED ORDER — PREDNISONE 20 MG PO TABS: 40.0000 mg | ORAL_TABLET | Freq: Every day | ORAL | 0 refills | Status: DC

## 2023-11-08 NOTE — Progress Notes (Signed)
 Virtual Visit Consent   Brittney Johns, you are scheduled for a virtual visit with a [facility] provider today. Just as with appointments in the office, your consent must be obtained to participate. Your consent will be active for this visit and any virtual visit you may have with one of our providers in the next 365 days. If you have a MyChart account, a copy of this consent can be sent to you electronically.  As this is a virtual visit, video technology does not allow for your provider to perform a traditional examination. This may limit your provider's ability to fully assess your condition. If your provider identifies any concerns that need to be evaluated in person or the need to arrange testing (such as labs, EKG, etc.), we will make arrangements to do so. Although advances in technology are sophisticated, we cannot ensure that it will always work on either your end or our end. If the connection with a video visit is poor, the visit may have to be switched to a telephone visit. With either a video or telephone visit, we are not always able to ensure that we have a secure connection.  By engaging in this virtual visit, you consent to the provision of healthcare and authorize for your insurance to be billed (if applicable) for the services provided during this visit. Depending on your insurance coverage, you may receive a charge related to this service.  I need to obtain your verbal consent now. Are you willing to proceed with your visit today? Brittney Johns has provided verbal consent on 11/08/2023 for a virtual visit (video or telephone). Margaretann Loveless, PA-C  Date: 11/08/2023 12:40 PM   Virtual Visit via Video Note   I, Margaretann Loveless, connected with  Brittney Johns  ([id number], 07/31/1971) on 11/08/23 at 12:30 PM EST by a video-enabled telemedicine application and verified that I am speaking with the correct person using two identifiers.  Location: Patient: Virtual Visit  Location Patient: Home Provider: Virtual Visit Location Provider: Home Office   I discussed the limitations of evaluation and management by telemedicine and the availability of in person appointments. The patient expressed understanding and agreed to proceed.    History of Present Illness: Brittney Johns is a 53 y.o. who identifies as a female who was assigned female at birth, and is being seen today for severe congestion and cough.  HPI: URI  This is a new problem. The current episode started in the past 7 days (Started Saturday 11/03/23, symptoms improved for a couple of days and symptoms returned worsening by Tuesday 11/06/23; This morning awoke with worsening congestion). The problem has been gradually worsening. There has been no fever. Associated symptoms include chest pain (burning with cough), congestion, coughing (from drainage), diarrhea (yesterday), ear pain (left, mild), headaches (had improved but returned today), a plugged ear sensation, rhinorrhea (and post nasal drainage), sinus pain, sneezing (yesterday) and a sore throat (started initially on Saturday, 11/03/23; was severe saturday "felt like swallowing glass"; improved but worsened today, "just uncomfortable"). Pertinent negatives include no nausea, vomiting or wheezing. Associated symptoms comments: Fatigue. Treatments tried: Mucinex, flonase, dayquil. The treatment provided no relief.     Problems:  Patient Active Problem List   Diagnosis Date Noted   Confusion 10/23/2023   Paresthesia 05/03/2023   Left lumbar radiculopathy 05/03/2023   Abnormal TSH 10/20/2022   Lumbosacral spondylosis without myelopathy 10/02/2022   Trochanteric bursitis of right hip 06/06/2022   Moderate episode of recurrent  major depressive disorder (HCC) 03/23/2022   Prediabetes 11/12/2020   Allergy 08/13/2020   Class 1 obesity with body mass index (BMI) of 32.0 to 32.9 in adult 09/20/2018   Family history of colon cancer requiring screening  colonoscopy    Status post vaginal hysterectomy 04/11/2016   Anxiety 04/11/2016   Allergic rhinitis 12/31/2015   Constipation 12/23/2015   Surgical menopause 04/07/2015   Chronic right hip pain 04/05/2015   Endometriosis    IBS (irritable bowel syndrome)    Migraine without aura 10/17/2013   Headache 10/17/2013   FOM (frequency of micturition) 04/24/2013   Incomplete bladder emptying 04/24/2013   Chronic interstitial cystitis 04/24/2013    Allergies:  Allergies  Allergen Reactions   Imitrex [Sumatriptan] Anaphylaxis   Other     As of 07/28/2023 -- there is a list of meds prior to 08/07/23 that pt may not be on due to planned surgery   Medications:  Current Outpatient Medications:    amoxicillin-clavulanate (AUGMENTIN) 875-125 MG tablet, Take 1 tablet by mouth 2 (two) times daily., Disp: 14 tablet, Rfl: 0   fluconazole (DIFLUCAN) 150 MG tablet, Take 1 tablet (150 mg total) by mouth every 3 (three) days as needed., Disp: 2 tablet, Rfl: 0   predniSONE (DELTASONE) 20 MG tablet, Take 2 tablets (40 mg total) by mouth daily with breakfast., Disp: 10 tablet, Rfl: 0   promethazine-dextromethorphan (PROMETHAZINE-DM) 6.25-15 MG/5ML syrup, Take 5 mLs by mouth 4 (four) times daily as needed., Disp: 118 mL, Rfl: 0   Biotin 5000 MCG CAPS, Take by mouth., Disp: , Rfl:    citalopram (CELEXA) 20 MG tablet, TAKE 1 TABLET(20 MG) BY MOUTH DAILY, Disp: 90 tablet, Rfl: 1   estradiol (ESTRACE) 1 MG tablet, TAKE 1 AND 1/2 TABLETS(1.5 MG) BY MOUTH DAILY, Disp: 135 tablet, Rfl: 2   fluticasone (FLONASE) 50 MCG/ACT nasal spray, Place 2 sprays into both nostrils daily., Disp: 16 g, Rfl: 0   gabapentin (NEURONTIN) 300 MG capsule, Take 300 mg by mouth 3 (three) times daily., Disp: , Rfl:    imipramine (TOFRANIL) 25 MG tablet, Take 25 mg by mouth daily. , Disp: , Rfl:    levocetirizine (XYZAL ALLERGY 24HR) 5 MG tablet, Take 1 tablet (5 mg total) by mouth every evening., Disp: 30 tablet, Rfl: 0   meclizine  (ANTIVERT) 25 MG tablet, Take 1 tablet (25 mg total) by mouth 3 (three) times daily as needed for dizziness., Disp: 30 tablet, Rfl: 0   methocarbamol (ROBAXIN) 750 MG tablet, Take 750 mg by mouth 3 (three) times daily., Disp: , Rfl:    Multiple Vitamins-Minerals (MULTIVITAMIN WITH MINERALS) tablet, Take 1 tablet by mouth daily., Disp: , Rfl:    polyethylene glycol powder (GLYCOLAX/MIRALAX) 17 GM/SCOOP powder, Take 17 g by mouth daily., Disp: 3350 g, Rfl: 0   Rimegepant Sulfate (NURTEC) 75 MG TBDP, Take 1 tablet (75 mg total) by mouth daily as needed (take for abortive therapy of migraine, no more than 1 tablet in 24 hours or 10 per month)., Disp: 8 tablet, Rfl: 11   topiramate (TOPAMAX) 100 MG tablet, Take 1 tablet (100 mg total) by mouth 2 (two) times daily., Disp: 60 tablet, Rfl: 11   traMADol (ULTRAM) 50 MG tablet, tramadol 50 mg tablet  TAKE 1 TABLET BY MOUTH TWICE DAILY AS NEEDED, Disp: , Rfl:    verapamil (CALAN) 80 MG tablet, Take 1 tablet (80 mg total) by mouth 2 (two) times daily. TAKE 1 TABLET (80 MG TOTAL) BY MOUTH 2 (  TWO) TIMES DAILY., Disp: 180 tablet, Rfl: 3  Observations/Objective: Patient is well-developed, well-nourished in no acute distress.  Resting comfortably at home.  Head is normocephalic, atraumatic.  No labored breathing.  Speech is clear and coherent with logical content.  Patient is alert and oriented at baseline.    Assessment and Plan: 1. Bacterial upper respiratory infection (Primary) - predniSONE (DELTASONE) 20 MG tablet; Take 2 tablets (40 mg total) by mouth daily with breakfast.  Dispense: 10 tablet; Refill: 0 - amoxicillin-clavulanate (AUGMENTIN) 875-125 MG tablet; Take 1 tablet by mouth 2 (two) times daily.  Dispense: 14 tablet; Refill: 0 - promethazine-dextromethorphan (PROMETHAZINE-DM) 6.25-15 MG/5ML syrup; Take 5 mLs by mouth 4 (four) times daily as needed.  Dispense: 118 mL; Refill: 0  2. Antibiotic-induced yeast infection - fluconazole (DIFLUCAN) 150  MG tablet; Take 1 tablet (150 mg total) by mouth every 3 (three) days as needed.  Dispense: 2 tablet; Refill: 0  - Worsening symptoms that have not responded to OTC medications.  - Will give Augmentin, Promethazine DM and Prednisone burst - Continue allergy medications.  - Steam and humidifier can help - Stay well hydrated and get plenty of rest.  - Seek in person evaluation if no symptom improvement or if symptoms worsen   Follow Up Instructions: I discussed the assessment and treatment plan with the patient. The patient was provided an opportunity to ask questions and all were answered. The patient agreed with the plan and demonstrated an understanding of the instructions.  A copy of instructions were sent to the patient via MyChart unless otherwise noted below.    The patient was advised to call back or seek an in-person evaluation if the symptoms worsen or if the condition fails to improve as anticipated.    Margaretann Loveless, PA-C

## 2023-11-08 NOTE — Patient Instructions (Signed)
 [name], thank you for joining Margaretann Loveless, PA-C for today's virtual visit.  While this provider is not your primary care provider (PCP), if your PCP is located in our provider database this encounter information will be shared with them immediately following your visit.   A [facility] MyChart account gives you access to today's visit and all your visits, tests, and labs performed at [facility] " click here if you don't have a [facility] MyChart account or go to [email or web address]  Consent: (Patient) Brittney Johns provided verbal consent for this virtual visit at the beginning of the encounter.  Current Medications:  Current Outpatient Medications:    amoxicillin-clavulanate (AUGMENTIN) 875-125 MG tablet, Take 1 tablet by mouth 2 (two) times daily., Disp: 14 tablet, Rfl: 0   fluconazole (DIFLUCAN) 150 MG tablet, Take 1 tablet (150 mg total) by mouth every 3 (three) days as needed., Disp: 2 tablet, Rfl: 0   predniSONE (DELTASONE) 20 MG tablet, Take 2 tablets (40 mg total) by mouth daily with breakfast., Disp: 10 tablet, Rfl: 0   promethazine-dextromethorphan (PROMETHAZINE-DM) 6.25-15 MG/5ML syrup, Take 5 mLs by mouth 4 (four) times daily as needed., Disp: 118 mL, Rfl: 0   Biotin 5000 MCG CAPS, Take by mouth., Disp: , Rfl:    citalopram (CELEXA) 20 MG tablet, TAKE 1 TABLET(20 MG) BY MOUTH DAILY, Disp: 90 tablet, Rfl: 1   estradiol (ESTRACE) 1 MG tablet, TAKE 1 AND 1/2 TABLETS(1.5 MG) BY MOUTH DAILY, Disp: 135 tablet, Rfl: 2   fluticasone (FLONASE) 50 MCG/ACT nasal spray, Place 2 sprays into both nostrils daily., Disp: 16 g, Rfl: 0   gabapentin (NEURONTIN) 300 MG capsule, Take 300 mg by mouth 3 (three) times daily., Disp: , Rfl:    imipramine (TOFRANIL) 25 MG tablet, Take 25 mg by mouth daily. , Disp: , Rfl:    levocetirizine (XYZAL ALLERGY 24HR) 5 MG tablet, Take 1 tablet (5 mg total) by mouth every evening., Disp: 30 tablet, Rfl: 0   meclizine  (ANTIVERT) 25 MG tablet, Take 1 tablet (25 mg total) by mouth 3 (three) times daily as needed for dizziness., Disp: 30 tablet, Rfl: 0   methocarbamol (ROBAXIN) 750 MG tablet, Take 750 mg by mouth 3 (three) times daily., Disp: , Rfl:    Multiple Vitamins-Minerals (MULTIVITAMIN WITH MINERALS) tablet, Take 1 tablet by mouth daily., Disp: , Rfl:    polyethylene glycol powder (GLYCOLAX/MIRALAX) 17 GM/SCOOP powder, Take 17 g by mouth daily., Disp: 3350 g, Rfl: 0   Rimegepant Sulfate (NURTEC) 75 MG TBDP, Take 1 tablet (75 mg total) by mouth daily as needed (take for abortive therapy of migraine, no more than 1 tablet in 24 hours or 10 per month)., Disp: 8 tablet, Rfl: 11   topiramate (TOPAMAX) 100 MG tablet, Take 1 tablet (100 mg total) by mouth 2 (two) times daily., Disp: 60 tablet, Rfl: 11   traMADol (ULTRAM) 50 MG tablet, tramadol 50 mg tablet  TAKE 1 TABLET BY MOUTH TWICE DAILY AS NEEDED, Disp: , Rfl:    verapamil (CALAN) 80 MG tablet, Take 1 tablet (80 mg total) by mouth 2 (two) times daily. TAKE 1 TABLET (80 MG TOTAL) BY MOUTH 2 (TWO) TIMES DAILY., Disp: 180 tablet, Rfl: 3   Medications ordered in this encounter:  Meds ordered this encounter  Medications   predniSONE (DELTASONE) 20 MG tablet    Sig: Take 2 tablets (40 mg total) by mouth daily with breakfast.    Dispense:  10 tablet  Refill:  0    Supervising Provider:   Merrilee Jansky [[id number]]   amoxicillin-clavulanate (AUGMENTIN) 875-125 MG tablet    Sig: Take 1 tablet by mouth 2 (two) times daily.    Dispense:  14 tablet    Refill:  0    Supervising Provider:   Merrilee Jansky [[id number]]   fluconazole (DIFLUCAN) 150 MG tablet    Sig: Take 1 tablet (150 mg total) by mouth every 3 (three) days as needed.    Dispense:  2 tablet    Refill:  0    Supervising Provider:   Merrilee Jansky [[id number]]   promethazine-dextromethorphan (PROMETHAZINE-DM) 6.25-15 MG/5ML syrup    Sig: Take 5 mLs by mouth 4 (four) times daily as needed.     Dispense:  118 mL    Refill:  0    Supervising Provider:   Merrilee Jansky [[id number]]     *If you need refills on other medications prior to your next appointment, please contact your pharmacy*  Follow-Up: Call back or seek an in-person evaluation if the symptoms worsen or if the condition fails to improve as anticipated.  [facility] [phone]  Other Instructions Upper Respiratory Infection, Adult An upper respiratory infection (URI) is a common viral infection of the nose, throat, and upper air passages that lead to the lungs. The most common type of URI is the common cold. URIs usually get better on their own, without medical treatment. What are the causes? A URI is caused by a virus. You may catch a virus by: Breathing in droplets from an infected person's cough or sneeze. Touching something that has been exposed to the virus (is contaminated) and then touching your mouth, nose, or eyes. What increases the risk? You are more likely to get a URI if: You are very young or very old. You have close contact with others, such as at work, school, or a health care facility. You smoke. You have long-term (chronic) heart or lung disease. You have a weakened disease-fighting system (immune system). You have nasal allergies or asthma. You are experiencing a lot of stress. You have poor nutrition. What are the signs or symptoms? A URI usually involves some of the following symptoms: Runny or stuffy (congested) nose. Cough. Sneezing. Sore throat. Headache. Fatigue. Fever. Loss of appetite. Pain in your forehead, behind your eyes, and over your cheekbones (sinus pain). Muscle aches. Redness or irritation of the eyes. Pressure in the ears or face. How is this diagnosed? This condition may be diagnosed based on your medical history and symptoms, and a physical exam. Your health care provider may use a swab to take a mucus sample from your nose (nasal swab). This  sample can be tested to determine what virus is causing the illness. How is this treated? URIs usually get better on their own within 7-10 days. Medicines cannot cure URIs, but your health care provider may recommend certain medicines to help relieve symptoms, such as: Over-the-counter cold medicines. Cough suppressants. Coughing is a type of defense against infection that helps to clear the respiratory system, so take these medicines only as recommended by your health care provider. Fever-reducing medicines. Follow these instructions at home: Activity Rest as needed. If you have a fever, stay home from work or school until your fever is gone or until your health care provider says your URI cannot spread to other people (is no longer contagious). Your health care provider may have you wear a face mask  to prevent your infection from spreading. Relieving symptoms Gargle with a mixture of salt and water 3-4 times a day or as needed. To make salt water, completely dissolve -1 tsp (3-6 g) of salt in 1 cup (237 mL) of warm water. Use a cool-mist humidifier to add moisture to the air. This can help you breathe more easily. Eating and drinking  Drink enough fluid to keep your urine pale yellow. Eat soups and other clear broths. General instructions  Take over-the-counter and prescription medicines only as told by your health care provider. These include cold medicines, fever reducers, and cough suppressants. Do not use any products that contain nicotine or tobacco. These products include cigarettes, chewing tobacco, and vaping devices, such as e-cigarettes. If you need help quitting, ask your health care provider. Stay away from secondhand smoke. Stay up to date on all immunizations, including the yearly (annual) flu vaccine. Keep all follow-up visits. This is important. How to prevent the spread of infection to others URIs can be contagious. To prevent the infection from spreading: Wash your  hands with soap and water for at least 20 seconds. If soap and water are not available, use hand sanitizer. Avoid touching your mouth, face, eyes, or nose. Cough or sneeze into a tissue or your sleeve or elbow instead of into your hand or into the air.  Contact a health care provider if: You are getting worse instead of better. You have a fever or chills. Your mucus is brown or red. You have yellow or brown discharge coming from your nose. You have pain in your face, especially when you bend forward. You have swollen neck glands. You have pain while swallowing. You have white areas in the back of your throat. Get help right away if: You have shortness of breath that gets worse. You have severe or persistent: Headache. Ear pain. Sinus pain. Chest pain. You have chronic lung disease along with any of the following: Making high-pitched whistling sounds when you breathe, most often when you breathe out (wheezing). Prolonged cough (more than 14 days). Coughing up blood. A change in your usual mucus. You have a stiff neck. You have changes in your: Vision. Hearing. Thinking. Mood. These symptoms may be an emergency. Get help right away. Call 911. Do not wait to see if the symptoms will go away. Do not drive yourself to the hospital. Summary An upper respiratory infection (URI) is a common infection of the nose, throat, and upper air passages that lead to the lungs. A URI is caused by a virus. URIs usually get better on their own within 7-10 days. Medicines cannot cure URIs, but your health care provider may recommend certain medicines to help relieve symptoms. This information is not intended to replace advice given to you by your health care provider. Make sure you discuss any questions you have with your health care provider. Document Revised: 04/06/2021 Document Reviewed: 04/06/2021 Elsevier Patient Education  2024 Elsevier Inc.   If you have been instructed to have an  in-person evaluation today at a local Urgent Care facility, please use the link below. It will take you to a list of all of our available [facility], including address, phone number and hours of operation. Please do not delay care.  [facility]  If you or a family member do not have a primary care provider, use the link below to schedule a visit and establish care. When you choose a [facility] primary care physician or  advanced practice provider, you gain a long-term partner in health. Find a Primary Care Provider  Learn more about [facility]'s in-office and virtual care options: [facility] - Get Care Now

## 2023-11-16 ENCOUNTER — Ambulatory Visit: Payer: 59 | Admitting: Neurology

## 2023-11-16 DIAGNOSIS — G43009 Migraine without aura, not intractable, without status migrainosus: Secondary | ICD-10-CM

## 2023-11-16 DIAGNOSIS — R202 Paresthesia of skin: Secondary | ICD-10-CM | POA: Diagnosis not present

## 2023-11-16 DIAGNOSIS — R410 Disorientation, unspecified: Secondary | ICD-10-CM

## 2023-11-19 NOTE — Procedures (Signed)
   HISTORY: 53 year old female presenting with episode of sudden onset dizziness, small blood  TECHNIQUE:  This is a routine 16 channel EEG recording with one channel devoted to a limited EKG recording.  It was performed during wakefulness, drowsiness and asleep.  Hyperventilation and photic stimulation were performed as activating procedures.  There are minimum muscle and movement artifact noted.  Upon maximum arousal, posterior dominant waking rhythm consistent of rhythmic alpha range activity. Activities are symmetric over the bilateral posterior derivations and attenuated with eye opening.  Photic stimulation did not alter the tracing.  Hyperventilation produced mild/moderate buildup with higher amplitude and the slower activities noted.  During EEG recording, patient developed drowsiness and no deeper stage of sleep was achieved During EEG recording, there was no epileptiform discharge noted.  EKG demonstrate normal sinus rhythm.  CONCLUSION: This is a  normal awake EEG.  There is no electrodiagnostic evidence of epileptiform discharge.  Levert Feinstein, M.D. Ph.D.  [facility] [address] [place], [place] [address] Phone: [phone] Fax:      [phone]

## 2023-11-20 ENCOUNTER — Encounter: Payer: Self-pay | Admitting: Neurology

## 2023-11-22 ENCOUNTER — Other Ambulatory Visit: Payer: Self-pay

## 2023-11-22 ENCOUNTER — Ambulatory Visit
Admission: EM | Admit: 2023-11-22 | Discharge: 2023-11-22 | Disposition: A | Discharge status: Home / Self Care | Attending: Emergency Medicine | Admitting: Emergency Medicine

## 2023-11-22 ENCOUNTER — Encounter: Payer: Self-pay | Admitting: Emergency Medicine

## 2023-11-22 DIAGNOSIS — J101 Influenza due to other identified influenza virus with other respiratory manifestations: Secondary | ICD-10-CM | POA: Diagnosis not present

## 2023-11-22 LAB — POC COVID19/FLU A&B COMBO
Covid Antigen, POC: NEGATIVE
Influenza A Antigen, POC: POSITIVE — AB
Influenza B Antigen, POC: NEGATIVE

## 2023-11-22 MED ORDER — OSELTAMIVIR PHOSPHATE 75 MG PO CAPS: 75.0000 mg | ORAL_CAPSULE | Freq: Two times a day (BID) | ORAL | 0 refills | Status: DC

## 2023-11-22 MED ORDER — BENZONATATE 100 MG PO CAPS: 100.0000 mg | ORAL_CAPSULE | Freq: Three times a day (TID) | ORAL | 0 refills | Status: DC
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
## 2023-11-22 NOTE — Discharge Instructions (Addendum)
 Influenza A is a virus and should steadily improve in time it can take up to 7 to 10 days before you truly start to see a turnaround however things will get better  Begin Tamiflu every morning and every evening for 5 days to reduce the amount of virus in the body which helps to minimize symptoms  May use Tessalon pill every 8 hours as needed for cough    You can take Tylenol and/or Ibuprofen as needed for fever reduction and pain relief.   For cough: honey 1/2 to 1 teaspoon (you can dilute the honey in water or another fluid).  You can also use guaifenesin and dextromethorphan for cough. You can use a humidifier for chest congestion and cough.  If you don't have a humidifier, you can sit in the bathroom with the hot shower running.      For sore throat: try warm salt water gargles, cepacol lozenges, throat spray, warm tea or water with lemon/honey, popsicles or ice, or OTC cold relief medicine for throat discomfort.   For congestion: take a daily anti-histamine like Zyrtec, Claritin, and a oral decongestant, such as pseudoephedrine.  You can also use Flonase 1-2 sprays in each nostril daily.   It is important to stay hydrated: drink plenty of fluids (water, gatorade/powerade/pedialyte, juices, or teas) to keep your throat moisturized and help further relieve irritation/discomfort.

## 2023-11-22 NOTE — ED Triage Notes (Signed)
 Runny and stuffy nose, sore throat, cough, body aches, chills, headache.   Takes migraine medicine and the headache has subsided some.   Cold type symptoms last week, started Amoxicillin on the 20th, finished on the 27th.  Low dose prednisone as well.   These symptoms started yesterday.

## 2023-12-03 ENCOUNTER — Telehealth: Payer: Self-pay

## 2023-12-03 DIAGNOSIS — R410 Disorientation, unspecified: Secondary | ICD-10-CM

## 2023-12-03 DIAGNOSIS — R202 Paresthesia of skin: Secondary | ICD-10-CM

## 2023-12-03 DIAGNOSIS — G43009 Migraine without aura, not intractable, without status migrainosus: Secondary | ICD-10-CM

## 2023-12-03 NOTE — Telephone Encounter (Signed)
 Orders Placed This Encounter  Procedures   AMBULATORY EEG   Please let patient know, I have ordered video EEG monitoring to clarify her described event  In the meantime, please continue with Topamax 100 mg twice a day, Nurtec as needed for headache

## 2023-12-03 NOTE — Telephone Encounter (Signed)
 Call to patient, she reports several episodes this weekend where she felt nauseate, had the smell of blood, numbness in her lower legs and then has a headache. These episodes are lasting between 10-30 minutes. She is taking Topamax 100 mg twice daily and reported fatigue and brain fog as well as a metallic taste. I confirmed those were side effects of the medication but would reach out to Dr. Terrace Arabia regarding the episodes. Patient appreciative

## 2023-12-03 NOTE — Addendum Note (Signed)
 Addended by: Levert Feinstein on: 12/03/2023 02:43 PM   Modules accepted: Orders

## 2023-12-03 NOTE — Telephone Encounter (Signed)
 Mychart message sent to patient.

## 2023-12-03 NOTE — Telephone Encounter (Signed)
 Call to patient, no answer. Left message to call back if needed and my chart message was sent.

## 2023-12-03 NOTE — Telephone Encounter (Signed)
 Pt called and LVM calling nurse back. She would like to be called back again.

## 2023-12-03 NOTE — Telephone Encounter (Signed)
 Lvm 1st attempt by hf 12/03/23

## 2023-12-27 ENCOUNTER — Encounter: Payer: Self-pay | Admitting: Neurology

## 2023-12-31 DIAGNOSIS — R569 Unspecified convulsions: Secondary | ICD-10-CM | POA: Diagnosis not present

## 2023-12-31 DIAGNOSIS — R202 Paresthesia of skin: Secondary | ICD-10-CM

## 2024-01-03 ENCOUNTER — Encounter (INDEPENDENT_AMBULATORY_CARE_PROVIDER_SITE_OTHER): Payer: Self-pay | Admitting: Neurology

## 2024-01-03 DIAGNOSIS — R202 Paresthesia of skin: Secondary | ICD-10-CM

## 2024-01-03 DIAGNOSIS — R410 Disorientation, unspecified: Secondary | ICD-10-CM

## 2024-01-03 DIAGNOSIS — G43009 Migraine without aura, not intractable, without status migrainosus: Secondary | ICD-10-CM

## 2024-01-03 NOTE — Procedures (Signed)
 Clinical History:   This is a 53 y/o F who presents for evaluation of bilateral upper and lower extremity paresthesia, chronic migraine headaches.   INTERMITTENT MONITORING with VIDEO TECHNICAL SUMMARY:  This AVEEG was performed using equipment provided by [employer] utilizing Bluetooth ( [employer] ) amplifiers with continuous EEGT attended video collection using encrypted remote transmission via [employer] secured cellular tower network with data rates for each AVEEG performed. This is a Therapist, music AVEEG, obtained, according to the 10-20 international electrode placement system, reformatted digitally into referential and bipolar montages. Data was acquired with a minimum of 21 bipolar connections and sampled at a minimum rate of 250 cycles per second per channel, maximum rate of 450 cycles per second per channel and two channels for EKG. The entire VEEG study was recorded through cable and or radio telemetry for subsequent analysis. Specified epochs of the AVEEG data were identified at the direction of the subject by the depression of a push button by the patient. Each patients event file included data acquired two minutes prior to the push button activation and continuing until two minutes afterwards. AVEEG files were reviewed on [employer] server, Licensed Software provided by [employer] with a digital high frequency filter set at 70 Hz and a low frequency filter set at 1 Hz with a paper speed of 54mm/s resulting in 10 seconds per digital page. This entire AVEEG was reviewed by the EEG Technologist. Random time samples, random sleep samples, clips, patient initiated push button files with included patient daily diary logs, EEG Technologist pruned data was reviewed and verified for accuracy and validity by the governing reading neurologist in full details. This AEEGV was fully compliant with all requirements for CPT 97500 for setup, patient education, take down and administered  by an EEG technologist.   Long-Term EEG with Video was monitored intermittently by a qualified EEG technologist for the entirety of the recording; quality check-ins were performed at a minimum of every two hours, checking and documenting real-time data and video to assure the integrity and quality of the recording (e.g., camera position, electrode integrity and impedance), and identify the need for maintenance. For intermittent monitoring, an EEG Technologist monitored no more than 12 patients concurrently. Diagnostic video was captured at least 80% of the time during the recording.   PATIENT EVENTS:  A button press or notation was made 2 times. Patient log was reviewed with the patient at disconnect with the intent to reconcile events. Patient noted events  PATIENT EVENT - #1 - WATCHING TELEVISION, HEADACHE. NO EVENT BUTTON PRESS. EEG SHOWS AWAKE BACKGROUND WITHOUT ICTAL CHANGES. PATIENT IS ON CAMERA. (04:00)2023/12/29 11:32  PATIENT EVENT - #2 - WATCHING TV, LOOKING AT APP ON PHONE, HEADACHE AND NUMBNESS IN LOWER LEGS INTENSE LEG PAIN IN LOWER LEGS, LEFT, NAUSEA. NO EVENT BUTTON PRESS. EEG SHOWS AWAKE BACKGROUND WITHOUT ICTAL CHANGES. PATIENT IS ON CAMERA. (04:00)2023/12/29 12:31   PATIENT EVENT - #3 - LIGHT HOUSEWORK, DIZZINESS, HEADACHE, TINGLING IN LOWER LEGS. NO EVENT BUTTON PRESS. EEG SHOWS AWAKE BACKGROUND WITHOUT ICTAL CHANGES. PATIENT IS NOT ON CAMERA. (04:00)2023/12/30 10:30   PATIENT EVENT - #4 - READING, NUMBNESS i=IN RIGHT HAND, HEADACHES AND TINGLING IN LOWER LEGS. NO EVENT BUTTON PRESS. EEG SHOWS AWAKE BACKGROUND WITHOUT ICTAL CHANGES. PATIENT IS NOT ON CAMERA. (04:00)2023/12/31 08:59  TECHNOLOGIST EVENTS:  No clear epileptiform activity was detected by the reviewing neurodiagnostic technologist for further review.  TIME SAMPLES:  10-minutes of every 2 hours recorded are reviewed as random time  samples.   SLEEP SAMPLES:  5-minutes of every 24 hours recorded are reviewed as  random sleep samples.   AWAKE:  At maximal level of alertness, the posterior dominant background activity was continuous, reactive, low voltage rhythm of 9 Hz. This was symmetric, well-modulated, and attenuated with eye opening. Diffuse, symmetric, frontocentral beta range activity was present.   SLEEP:  N1 Sleep (Stage 1) was observed and characterized by the disappearance of alpha rhythm and the appearance of vertex activity.   N2 Sleep (Stage 2) was observed and characterized by vertex waves, K-complexes, and sleep spindles.   N3 (Stage 3) sleep was observed and characterized by high amplitude Delta activity of 20%.   REM sleep was observed.   EKG:  There were no arrhythmias or abnormalities noted during this recording.  Impression:  This is a normal 3-day ambulatory EEG tracing. No focal abnormalities or epileptiform discharges were seen. There were no electrographic seizures noted. There were a total of 4 events as described above with no changes in EEG background, hence nonepileptic.    Clarann Helvey, MD [facility]

## 2024-01-07 ENCOUNTER — Encounter: Payer: Self-pay | Admitting: Neurology

## 2024-01-14 ENCOUNTER — Telehealth: Payer: Self-pay | Admitting: Physician Assistant

## 2024-01-14 NOTE — Telephone Encounter (Signed)
 The patient called in to ask if she would need to find a new GI provider since her previous provider is no longer with the practice. I advised her that she is welcome to stay with our practice, but it may be a little while before she is able to see a new provider. She understood and agreed to remain with us .  The patient also reported that her IBS symptoms flare up every time she eats, with complaints of bloating, cramping, and diarrhea, which she stated are particularly severe at night. She mentioned that these symptoms have been ongoing since January. Additionally, the patient explained that she had to cancel her colonoscopy due to back surgery. She noted that she was previously on medication that helped manage her symptoms, but the medication was discontinued.

## 2024-01-14 NOTE — Telephone Encounter (Signed)
 Patient saw Brian Campanile 6 months ago on 06/26/2023. Please advise if you will see patient before you leave?

## 2024-01-17 ENCOUNTER — Other Ambulatory Visit: Payer: Self-pay

## 2024-01-17 DIAGNOSIS — Z1211 Encounter for screening for malignant neoplasm of colon: Secondary | ICD-10-CM

## 2024-01-17 MED ORDER — NA SULFATE-K SULFATE-MG SULF 17.5-3.13-1.6 GM/177ML PO SOLN: 354.0000 mL | Freq: Once | ORAL | 0 refills | Status: AC

## 2024-01-17 NOTE — Telephone Encounter (Signed)
 Patient verbalized understanding.She schedule colonoscopy for 02/08/2024 in [place]. Went over instructions and mailed them, sent to mychart account. Sent prep to the pharmacy. She will pick up samples gave 2 boxes.

## 2024-01-17 NOTE — Telephone Encounter (Signed)
Tried to call patient but mailbox is full unable to leave a message

## 2024-01-17 NOTE — Addendum Note (Signed)
 Addended by: Alicha Raspberry L on: 01/17/2024 12:00 PM   Modules accepted: Orders

## 2024-01-17 NOTE — Telephone Encounter (Signed)
 She is due for her screening colonoscopy, with 2 day prep, recommend to schedule that first Have her try linzess samples in the meantime  RV

## 2024-01-31 ENCOUNTER — Encounter: Payer: Self-pay | Admitting: Gastroenterology
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
## 2024-02-08 ENCOUNTER — Ambulatory Visit: Admit: 2024-02-08 | Admitting: Gastroenterology

## 2024-02-08 ENCOUNTER — Encounter: Payer: Self-pay | Admitting: Anesthesiology

## 2024-02-08 SURGERY — COLONOSCOPY
Anesthesia: General

## 2024-03-04 ENCOUNTER — Ambulatory Visit: Payer: Self-pay

## 2024-03-04 NOTE — Telephone Encounter (Signed)
 FYI Only or Action Required?: FYI only for provider  Patient was last seen in primary care on 10/18/2023 by Quinton Buckler, FNP. Called Nurse Triage reporting Fatigue. Symptoms began several weeks ago. Interventions attempted: Nothing. Symptoms are: gradually worsening.  Triage Disposition: See PCP When Office is Open (Within 3 Days)  Patient/caregiver understands and will follow disposition?: Yes, will follow disposition  Copied from CRM [id number]. Topic: Clinical - Red Word Triage >> Mar 04, 2024  1:28 PM Carlatta H wrote: [facility] that prompted transfer to Nurse Triage: Fatigue and pain in lower hip area//constant pain for about 2 weeks Reason for Disposition  [1] MILD weakness (i.e., does not interfere with ability to work, go to school, normal activities) AND [2] persists > 1 week  Answer Assessment - Initial Assessment Questions 1. DESCRIPTION: Describe how you are feeling.     Wake up tired, even if sleeping 8 hrs,  2. SEVERITY: How bad is it?  Can you stand and walk?   - MILD (0-3): Feels weak or tired, but does not interfere with work, school or normal activities.   - MODERATE (4-7): Able to stand and walk; weakness interferes with work, school, or normal activities.   - SEVERE (8-10): Unable to stand or walk; unable to do usual activities.     Hip pain-9 3. ONSET: When did these symptoms begin? (e.g., hours, days, weeks, months)     02/21/24 4. CAUSE: What do you think is causing the weakness or fatigue? (e.g., not drinking enough fluids, medical problem, trouble sleeping)     Unsure, pt did stop taking vitamins for colonoscopy but has been back on the vitamins 5. NEW MEDICINES:  Have you started on any new medicines recently? (e.g., opioid pain medicines, benzodiazepines, muscle relaxants, antidepressants, antihistamines, neuroleptics, beta blockers)     denies 6. OTHER SYMPTOMS: Do you have any other symptoms? (e.g., chest pain, fever, cough, SOB, vomiting, diarrhea,  bleeding, other areas of pain)     Just hip pain 7. PREGNANCY: Is there any chance you are pregnant? When was your last menstrual period?     denies  Protocols used: Weakness (Generalized) and Fatigue-A-AH

## 2024-03-06 ENCOUNTER — Encounter: Payer: Self-pay | Admitting: Family Medicine

## 2024-03-06 ENCOUNTER — Ambulatory Visit: Admitting: Family Medicine

## 2024-03-06 VITALS — BP 118/72 | HR 93 | Resp 16 | Ht 67.0 in | Wt 209.0 lb

## 2024-03-06 DIAGNOSIS — M545 Low back pain, unspecified: Secondary | ICD-10-CM

## 2024-03-06 DIAGNOSIS — M25552 Pain in left hip: Secondary | ICD-10-CM

## 2024-03-06 DIAGNOSIS — M25551 Pain in right hip: Secondary | ICD-10-CM

## 2024-03-06 DIAGNOSIS — M4310 Spondylolisthesis, site unspecified: Secondary | ICD-10-CM | POA: Insufficient documentation

## 2024-03-06 DIAGNOSIS — R5383 Other fatigue: Secondary | ICD-10-CM

## 2024-03-06 DIAGNOSIS — S39012A Strain of muscle, fascia and tendon of lower back, initial encounter: Secondary | ICD-10-CM | POA: Insufficient documentation

## 2024-03-06 MED ORDER — PREDNISONE 20 MG PO TABS: 40.0000 mg | ORAL_TABLET | Freq: Every day | ORAL | 0 refills | Status: AC

## 2024-03-06 NOTE — Progress Notes (Signed)
 Patient ID: Brittney Johns, female    DOB: [birth year], 53 y.o.   MRN: [id number]  PCP: Quinton Buckler, FNP  Chief Complaint  Patient presents with   Hip Pain    B/L, sore and aching. Muscle massagers makes worse, feels deep inside. Affecting sleep. Hx of bursitis in hips. Had a post f/u xray from back surgery on 02/27/24, but surgeon said unrelated. Currently in PT   Fatigue    Unable to sleep well due to hip pain    Subjective:   Brittney Johns is a 53 y.o. female, presents to clinic with CC of the following:  HPI  Brittney Johns spine and surgery managing Dr. Jaycee Metro She is also seeing pain management specialist and medical neurology for muscle relaxers, diclofen, gabapentin , tramadol , nutrec, topimax HA/migraines/seizure like episodes Dr. Marda Shack NP pain management specialists could do injections She has continued pt        Patient Active Problem List   Diagnosis Date Noted   Degenerative spondylolisthesis 03/06/2024   Confusion 10/23/2023   Paresthesia 05/03/2023   Left lumbar radiculopathy 05/03/2023   Abnormal TSH 10/20/2022   Lumbosacral spondylosis without myelopathy 10/02/2022   Trochanteric bursitis of right hip 06/06/2022   Moderate episode of recurrent major depressive disorder (HCC) 03/23/2022   Prediabetes 11/12/2020   Allergy 08/13/2020   Class 1 obesity with body mass index (BMI) of 32.0 to 32.9 in adult 09/20/2018   Family history of colon cancer requiring screening colonoscopy    Status post vaginal hysterectomy 04/11/2016   Anxiety 04/11/2016   Allergic rhinitis 12/31/2015   Constipation 12/23/2015   Surgical menopause 04/07/2015   Chronic right hip pain 04/05/2015   Endometriosis    IBS (irritable bowel syndrome)    Migraine without aura 10/17/2013   Headache 10/17/2013   FOM (frequency of micturition) 04/24/2013   Incomplete bladder emptying 04/24/2013   Chronic interstitial cystitis 04/24/2013      Current Outpatient  Medications:    Biotin 5000 MCG CAPS, Take by mouth., Disp: , Rfl:    citalopram  (CELEXA ) 20 MG tablet, TAKE 1 TABLET(20 MG) BY MOUTH DAILY, Disp: 90 tablet, Rfl: 1   estradiol  (ESTRACE ) 1 MG tablet, TAKE 1 AND 1/2 TABLETS(1.5 MG) BY MOUTH DAILY, Disp: 135 tablet, Rfl: 2   fluconazole  (DIFLUCAN ) 150 MG tablet, Take 1 tablet (150 mg total) by mouth every 3 (three) days as needed., Disp: 2 tablet, Rfl: 0   fluticasone  (FLONASE ) 50 MCG/ACT nasal spray, Place 2 sprays into both nostrils daily., Disp: 16 g, Rfl: 0   gabapentin  (NEURONTIN ) 300 MG capsule, Take 300 mg by mouth 3 (three) times daily., Disp: , Rfl:    imipramine  (TOFRANIL ) 25 MG tablet, Take 25 mg by mouth daily. , Disp: , Rfl:    methocarbamol (ROBAXIN) 750 MG tablet, Take 750 mg by mouth 3 (three) times daily., Disp: , Rfl:    Multiple Vitamins-Minerals (MULTIVITAMIN WITH MINERALS) tablet, Take 1 tablet by mouth daily., Disp: , Rfl:    Rimegepant Sulfate (NURTEC) 75 MG TBDP, Take 1 tablet (75 mg total) by mouth daily as needed (take for abortive therapy of migraine, no more than 1 tablet in 24 hours or 10 per month)., Disp: 8 tablet, Rfl: 11   topiramate  (TOPAMAX ) 100 MG tablet, Take 1 tablet (100 mg total) by mouth 2 (two) times daily., Disp: 60 tablet, Rfl: 11   traMADol  (ULTRAM ) 50 MG tablet, tramadol  50 mg tablet  TAKE 1 TABLET BY MOUTH TWICE DAILY AS  NEEDED, Disp: , Rfl:    verapamil  (CALAN ) 80 MG tablet, Take 1 tablet (80 mg total) by mouth 2 (two) times daily. TAKE 1 TABLET (80 MG TOTAL) BY MOUTH 2 (TWO) TIMES DAILY., Disp: 180 tablet, Rfl: 3   Allergies  Allergen Reactions   Imitrex [Sumatriptan] Anaphylaxis     Social History   Tobacco Use   Smoking status: Never   Smokeless tobacco: Never  Vaping Use   Vaping status: Never Used  Substance Use Topics   Alcohol use: No    Alcohol/week: 0.0 standard drinks of alcohol   Drug use: No      Chart Review Today: I personally reviewed active problem list, medication  list, allergies, family history, social history, health maintenance, notes from last encounter, lab results, imaging with the patient/caregiver today.   Review of Systems  Constitutional: Negative.   HENT: Negative.    Eyes: Negative.   Respiratory: Negative.    Cardiovascular: Negative.   Gastrointestinal: Negative.   Endocrine: Negative.   Genitourinary: Negative.   Musculoskeletal: Negative.   Skin: Negative.   Allergic/Immunologic: Negative.   Neurological: Negative.   Hematological: Negative.   Psychiatric/Behavioral: Negative.    All other systems reviewed and are negative.      Objective:   Vitals:   03/06/24 1132  BP: 118/72  Pulse: 93  Resp: 16  SpO2: 99%  Weight: 209 lb (94.8 kg)  Height: 5' 7 (1.702 m)    Body mass index is 32.73 kg/m.  Physical Exam Vitals and nursing note reviewed.  Constitutional:      General: She is not in acute distress.    Appearance: Normal appearance. She is well-developed. She is obese. She is not ill-appearing, toxic-appearing or diaphoretic.  HENT:     Head: Normocephalic and atraumatic.     Right Ear: External ear normal.     Left Ear: External ear normal.     Nose: Nose normal.   Eyes:     General: No scleral icterus.       Right eye: No discharge.        Left eye: No discharge.     Conjunctiva/sclera: Conjunctivae normal.   Neck:     Trachea: No tracheal deviation.   Cardiovascular:     Rate and Rhythm: Normal rate.  Pulmonary:     Effort: Pulmonary effort is normal. No respiratory distress.     Breath sounds: No stridor.   Musculoskeletal:     Comments: Bilateral hip ttp over greater trochanter, also ttp to SI joint area bilaterally and lumbar paraspinal muscles   Skin:    General: Skin is warm and dry.     Findings: No rash.   Neurological:     Mental Status: She is alert.     Motor: No abnormal muscle tone.     Coordination: Coordination normal.     Gait: Gait normal.   Psychiatric:        Mood  and Affect: Mood normal.        Behavior: Behavior normal.      Results for orders placed or performed during the hospital encounter of 11/22/23  POC Covid19/Flu A&B Antigen   Collection Time: 11/22/23  9:15 AM  Result Value Ref Range   Influenza A Antigen, POC Positive (A)    Influenza B Antigen, POC Negative    Covid Antigen, POC Negative        Assessment & Plan:     ICD-10-CM   1. Bilateral hip  pain  M25.551 predniSONE  (DELTASONE ) 20 MG tablet   M25.552    pain over greater trochanter bilaterally, ddx bursitis, IT band syndrome, f/up pain management for injection, we can refer to ortho if needed    2. Bilateral low back pain, unspecified chronicity, unspecified whether sciatica present  M54.50 predniSONE  (DELTASONE ) 20 MG tablet   neurosurgery managing, Dr. Gwendlyn Lemmings has said not related to surgery, doing PT    3. Fatigue, unspecified type  R53.83    suspect multifactorial - could change some meds that may be sedating, f/up wtih pain management and neurology      Consider talking to neuro and pain about med interactions, consider d/c gabapentin  and trial of lyrica, bursitis injections per specialist likely will help, she can try oral steroids while waiting to get in with her specialists Asked her to contact me or PCP if she needs referrals for these issues    Adeline Hone, PA-C 03/06/24 11:48 AM

## 2024-03-06 NOTE — Patient Instructions (Addendum)
 Ask your specialists about possibly switching out gabapentin  for lyrica or something less sedating Also please let me or Concha Deed know if you need an ortho referral

## 2024-03-22 ENCOUNTER — Other Ambulatory Visit: Payer: Self-pay | Admitting: Nurse Practitioner

## 2024-03-22 DIAGNOSIS — F331 Major depressive disorder, recurrent, moderate: Secondary | ICD-10-CM

## 2024-03-25 NOTE — Telephone Encounter (Signed)
 Requested Prescriptions  Pending Prescriptions Disp Refills   citalopram  (CELEXA ) 20 MG tablet [Pharmacy Med Name: CITALOPRAM  20MG  TABLETS] 90 tablet 0    Sig: TAKE 1 TABLET(20 MG) BY MOUTH DAILY     Psychiatry:  Antidepressants - SSRI Failed - 03/25/2024  2:24 PM      Failed - Valid encounter within last 6 months    Recent Outpatient Visits           2 weeks ago Bilateral hip pain   [facility] Michelene, PA-C              Passed - Completed PHQ-2 or PHQ-9 in the last 360 days

## 2024-04-09 ENCOUNTER — Encounter: Payer: Self-pay | Admitting: Emergency Medicine

## 2024-04-09 ENCOUNTER — Ambulatory Visit
Admission: EM | Admit: 2024-04-09 | Discharge: 2024-04-09 | Disposition: A | Discharge status: Home / Self Care | Attending: Emergency Medicine | Admitting: Emergency Medicine

## 2024-04-09 DIAGNOSIS — J069 Acute upper respiratory infection, unspecified: Secondary | ICD-10-CM

## 2024-04-09 LAB — POC SARS CORONAVIRUS 2 AG -  ED: SARS Coronavirus 2 Ag: NEGATIVE

## 2024-04-09 MED ORDER — AMOXICILLIN-POT CLAVULANATE 875-125 MG PO TABS: 1.0000 | ORAL_TABLET | Freq: Two times a day (BID) | ORAL | 0 refills | Status: DC

## 2024-04-09 MED ORDER — FLUTICASONE PROPIONATE 50 MCG/ACT NA SUSP: 1.0000 | Freq: Every day | NASAL | 0 refills | Status: AC
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
## 2024-04-09 NOTE — Discharge Instructions (Signed)
 Your symptoms today are most likely being caused by a virus and should steadily improve in time it can take up to 7 to 10 days before you truly start to see a turnaround however things will get better, if no improvement seen by Sunday may pick up Augmentin  from the pharmacy  COVID testing negative  In the meantime continue Mucinex  may also use nasal spray every morning to help with congestion    You can take Tylenol  and/or Ibuprofen  as needed for fever reduction and pain relief.   For cough: honey 1/2 to 1 teaspoon (you can dilute the honey in water or another fluid).  You can also use guaifenesin  and dextromethorphan for cough. You can use a humidifier for chest congestion and cough.  If you don't have a humidifier, you can sit in the bathroom with the hot shower running.      For sore throat: try warm salt water gargles, cepacol lozenges, throat spray, warm tea or water with lemon/honey, popsicles or ice, or OTC cold relief medicine for throat discomfort.   For congestion: take a daily anti-histamine like Zyrtec, Claritin, and a oral decongestant, such as pseudoephedrine.  You can also use Flonase  1-2 sprays in each nostril daily.   It is important to stay hydrated: drink plenty of fluids (water, gatorade/powerade/pedialyte, juices, or teas) to keep your throat moisturized and help further relieve irritation/discomfort.

## 2024-04-09 NOTE — ED Triage Notes (Signed)
 Patient reports dry nose and cough with green mucus x 4 days. Has used Nyquil/Dayquil and Mucinex . Denies pain.
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
## 2024-04-17 NOTE — Patient Instructions (Signed)
 Below is our plan:  We will continue topiramate  50mg  twice daily, verapamil  80mg  daily and Nurtec as needed. Continue imipramine  and gabapentin  per care team.   I recommend you speak with your urology provider regarding use of estrogen. You have had dizziness and unusual smells prior to a headache which could be an aura. Women who have migraine with aura and are on estrogen can be at a higher risk for stroke. Please discuss possible risks versus benefits with your provider.   Please make sure you are staying well hydrated. I recommend 50-60 ounces daily. Well balanced diet and regular exercise encouraged. Consistent sleep schedule with 6-8 hours recommended.   Please continue follow up with care team as directed.   Follow up with me in 1 year   You may receive a survey regarding today's visit. I encourage you to leave honest feed back as I do use this information to improve patient care. Thank you for seeing me today!    GENERAL HEADACHE INFORMATION:   Natural supplements: Magnesium Oxide or Magnesium Glycinate 500 mg at bed (up to 800 mg daily) Coenzyme Q10 300 mg in AM Vitamin B2- 200 mg twice a day   Add 1 supplement at a time since even natural supplements can have undesirable side effects. You can sometimes buy supplements cheaper (especially Coenzyme Q10) at www.WebmailGuide.co.za or at [facility].  Migraine with aura: There is increased risk for stroke in women with migraine with aura and a contraindication for the combined contraceptive pill for use by women who have migraine with aura. The risk for women with migraine without aura is lower. However other risk factors like smoking are far more likely to increase stroke risk than migraine. There is a recommendation for no smoking and for the use of OCPs without estrogen such as progestogen only pills particularly for women with migraine with aura.SABRA People who have migraine headaches with auras may be 3 times more likely to have a stroke caused by  a blood clot, compared to migraine patients who don't see auras. Women who take hormone-replacement therapy may be 30 percent more likely to suffer a clot-based stroke than women not taking medication containing estrogen. Other risk factors like smoking and high blood pressure may be  much more important.    Vitamins and herbs that show potential:   Magnesium: Magnesium (250 mg twice a day or 500 mg at bed) has a relaxant effect on smooth muscles such as blood vessels. Individuals suffering from frequent or daily headache usually have low magnesium levels which can be increase with daily supplementation of 400-750 mg. Three trials found 40-90% average headache reduction  when used as a preventative. Magnesium may help with headaches are aura, the best evidence for magnesium is for migraine with aura is its thought to stop the cortical spreading depression we believe is the pathophysiology of migraine aura.Magnesium also demonstrated the benefit in menstrually related migraine.  Magnesium is part of the messenger system in the serotonin cascade and it is a good muscle relaxant.  It is also useful for constipation which can be a side effect of other medications used to treat migraine. Good sources include nuts, whole grains, and tomatoes. Side Effects: loose stool/diarrhea  Riboflavin (vitamin B 2) 200 mg twice a day. This vitamin assists nerve cells in the production of ATP a principal energy storing molecule.  It is necessary for many chemical reactions in the body.  There have been at least 3 clinical trials of riboflavin using 400  mg per day all of which suggested that migraine frequency can be decreased.  All 3 trials showed significant improvement in over half of migraine sufferers.  The supplement is found in bread, cereal, milk, meat, and poultry.  Most Americans get more riboflavin than the recommended daily allowance, however riboflavin deficiency is not necessary for the supplements to help prevent  headache. Side effects: energizing, green urine   Coenzyme Q10: This is present in almost all cells in the body and is critical component for the conversion of energy.  Recent studies have shown that a nutritional supplement of CoQ10 can reduce the frequency of migraine attacks by improving the energy production of cells as with riboflavin.  Doses of 150 mg twice a day have been shown to be effective.   Melatonin: Increasing evidence shows correlation between melatonin secretion and headache conditions.  Melatonin supplementation has decreased headache intensity and duration.  It is widely used as a sleep aid.  Sleep is natures way of dealing with migraine.  A dose of 3 mg is recommended to start for headaches including cluster headache. Higher doses up to 15 mg has been reviewed for use in Cluster headache and have been used. The rationale behind using melatonin for cluster is that many theories regarding the cause of Cluster headache center around the disruption of the normal circadian rhythm in the brain.  This helps restore the normal circadian rhythm.   HEADACHE DIET: Foods and beverages which may trigger migraine Note that only 20% of headache patients are food sensitive. You will know if you are food sensitive if you get a headache consistently 20 minutes to 2 hours after eating a certain food. Only cut out a food if it causes headaches, otherwise you might remove foods you enjoy! What matters most for diet is to eat a well balanced healthy diet full of vegetables and low fat protein, and to not miss meals.   Chocolate, other sweets ALL cheeses except cottage and cream cheese Dairy products, yogurt, sour cream, ice cream Liver Meat extracts (Bovril, Marmite, meat tenderizers) Meats or fish which have undergone aging, fermenting, pickling or smoking. These include: Hotdogs,salami,Lox,sausage, mortadellas,smoked salmon, pepperoni, Pickled herring Pods of broad bean (English beans, Chinese pea  pods, [place] (fava) beans, lima and navy beans Ripe avocado, ripe banana Yeast extracts or active yeast preparations such as Brewer's or Fleishman's (commercial bakes goods are permitted) Tomato based foods, pizza (lasagna, etc.)   MSG (monosodium glutamate) is disguised as many things; look for these common aliases: Monopotassium glutamate Autolysed yeast Hydrolysed protein Sodium caseinate "flavorings" "all natural preservatives Nutrasweet   Avoid all other foods that convincingly provoke headaches.   Resources: The Dizzy Bluford Aid Your Headache Diet, migrainestrong.com  https://zamora-andrews.com/   Caffeine and Migraine For patients that have migraine, caffeine intake more than 3 days per week can lead to dependency and increased migraine frequency. I would recommend cutting back on your caffeine intake as best you can. The recommended amount of caffeine is 200-300 mg daily, although migraine patients may experience dependency at even lower doses. While you may notice an increase in headache temporarily, cutting back will be helpful for headaches in the long run. For more information on caffeine and migraine, visit: https://americanmigrainefoundation.org/resource-library/caffeine-and-migraine/   Headache Prevention Strategies:   1. Maintain a headache diary; learn to identify and avoid triggers.  - This can be a simple note where you log when you had a headache, associated symptoms, and medications used - There are several smartphone apps  developed to help track migraines: Migraine Buddy, Migraine Monitor, Curelator N1-Headache App   Common triggers include: Emotional triggers: Emotional/Upset family or friends Emotional/Upset occupation Business reversal/success Anticipation anxiety Crisis-serious Post-crisis periodNew job/position   Physical triggers: Vacation Day Weekend Strenuous Exercise High Altitude Location New  Move Menstrual Day Physical Illness Oversleep/Not enough sleep Weather changes Light: Photophobia or light sesnitivity treatment involves a balance between desensitization and reduction in overly strong input. Use dark polarized glasses outside, but not inside. Avoid bright or fluorescent light, but do not dim environment to the point that going into a normally lit room hurts. Consider FL-41 tint lenses, which reduce the most irritating wavelengths without blocking too much light.  These can be obtained at axonoptics.com or theraspecs.com Foods: see list above.   2. Limit use of acute treatments (over-the-counter medications, triptans, etc.) to no more than 2 days per week or 10 days per month to prevent medication overuse headache (rebound headache).     3. Follow a regular schedule (including weekends and holidays): Don't skip meals. Eat a balanced diet. 8 hours of sleep nightly. Minimize stress. Exercise 30 minutes per day. Being overweight is associated with a 5 times increased risk of chronic migraine. Keep well hydrated and drink 6-8 glasses of water per day.   4. Initiate non-pharmacologic measures at the earliest onset of your headache. Rest and quiet environment. Relax and reduce stress. Breathe2Relax is a free app that can instruct you on    some simple relaxtion and breathing techniques. Http://Dawnbuse.com is a    free website that provides teaching videos on relaxation.  Also, there are  many apps that   can be downloaded for "mindful" relaxation.  An app called YOGA NIDRA will help walk you through mindfulness. Another app called Calm can be downloaded to give you a structured mindfulness guide with daily reminders and skill development. Headspace for guided meditation Mindfulness Based Stress Reduction Online Course: www.palousemindfulness.com Cold compresses.   5. Don't wait!! Take the maximum allowable dosage of prescribed medication at the first sign of migraine.   6.  Compliance:  Take prescribed medication regularly as directed and at the first sign of a migraine.   7. Communicate:  Call your physician when problems arise, especially if your headaches change, increase in frequency/severity, or become associated with neurological symptoms (weakness, numbness, slurred speech, etc.). Proceed to emergency room if you experience new or worsening symptoms or symptoms do not resolve, if you have new neurologic symptoms or if headache is severe, or for any concerning symptom.   8. Headache/pain management therapies: Consider various complementary methods, including medication, behavioral therapy, psychological counselling, biofeedback, massage therapy, acupuncture, dry needling, and other modalities.  Such measures may reduce the need for medications. Counseling for pain management, where patients learn to function and ignore/minimize their pain, seems to work very well.   9. Recommend changing family's attention and focus away from patient's headaches. Instead, emphasize daily activities. If first question of day is 'How are your headaches/Do you have a headache today?', then patient will constantly think about headaches, thus making them worse. Goal is to re-direct attention away from headaches, toward daily activities and other distractions.   10. Helpful Websites: www.AmericanHeadacheSociety.org PatentHood.ch www.headaches.org TightMarket.nl www.achenet.org

## 2024-04-23 ENCOUNTER — Ambulatory Visit
Admission: RE | Admit: 2024-04-23 | Discharge: 2024-04-23 | Disposition: A | Discharge status: Home / Self Care | Attending: Gastroenterology | Admitting: Gastroenterology

## 2024-04-23 ENCOUNTER — Encounter: Payer: Self-pay | Admitting: Gastroenterology

## 2024-04-23 ENCOUNTER — Ambulatory Visit: Admitting: Certified Registered"

## 2024-04-23 ENCOUNTER — Encounter
Admission: RE | Disposition: A | Discharge status: Home / Self Care | Payer: Self-pay | Source: Home / Self Care | Attending: Gastroenterology

## 2024-04-23 DIAGNOSIS — Z1211 Encounter for screening for malignant neoplasm of colon: Secondary | ICD-10-CM | POA: Diagnosis present

## 2024-04-23 DIAGNOSIS — F419 Anxiety disorder, unspecified: Secondary | ICD-10-CM | POA: Diagnosis not present

## 2024-04-23 DIAGNOSIS — F32A Depression, unspecified: Secondary | ICD-10-CM | POA: Diagnosis not present

## 2024-04-23 HISTORY — PX: COLONOSCOPY: SHX5424

## 2024-04-23 SURGERY — COLONOSCOPY
Anesthesia: General

## 2024-04-23 MED ORDER — LIDOCAINE HCL (CARDIAC) PF 100 MG/5ML IV SOSY
PREFILLED_SYRINGE | INTRAVENOUS | Status: DC | PRN
  Administered 2024-04-23: 50 mg via INTRAVENOUS

## 2024-04-23 MED ORDER — PROPOFOL 500 MG/50ML IV EMUL
INTRAVENOUS | Status: DC | PRN
  Administered 2024-04-23: 150 ug/kg/min via INTRAVENOUS

## 2024-04-23 MED ORDER — SODIUM CHLORIDE 0.9 % IV SOLN: INTRAVENOUS | Status: DC

## 2024-04-23 MED ORDER — PROPOFOL 10 MG/ML IV BOLUS
INTRAVENOUS | Status: DC | PRN
  Administered 2024-04-23: 20 mg via INTRAVENOUS
  Administered 2024-04-23: 80 mg via INTRAVENOUS

## 2024-04-23 NOTE — Transfer of Care (Signed)
 Immediate Anesthesia Transfer of Care Note  Patient: Brittney Johns  Procedure(s) Performed: COLONOSCOPY  Patient Location: Endoscopy Unit  Anesthesia Type:General  Level of Consciousness: drowsy and patient cooperative  Airway & Oxygen Therapy: Patient Spontanous Breathing and Patient connected to nasal cannula oxygen  Post-op Assessment: Report given to RN, Post -op Vital signs reviewed and stable, and Patient moving all extremities X 4  Post vital signs: Reviewed and stable  Last Vitals:  Vitals Value Taken Time  BP    Temp    Pulse    Resp    SpO2      Last Pain:  Vitals:   04/23/24 1019  TempSrc: Temporal  PainSc: 4          Complications: No notable events documented.

## 2024-04-23 NOTE — Op Note (Addendum)
 [facility] Patient Name: Brittney Johns Procedure Date: 04/23/2024 11:49 AM MRN: [id number] Account #: [id number] Date of Birth: September 25, 1970 Admit Type: Outpatient Age: 53 Room: [facility] ENDO ROOM 4 Gender: Female Note Status: Supervisor Override Instrument Name: [id number] Procedure:             Colonoscopy Indications:           Screening for colorectal malignant neoplasm, Family                         history of rectal cancer in a first-degree relative                         before age 21 years Providers:             Cyanne Delmar Jess Brooklyn MD, MD Referring MD:          Mliss FALCON. Gareth (Referring MD) Medicines:             General Anesthesia Complications:         No immediate complications. Estimated blood loss: None. Procedure:             Pre-Anesthesia Assessment:                        - Prior to the procedure, a History and Physical was                         performed, and patient medications and allergies were                         reviewed. The patient is competent. The risks and                         benefits of the procedure and the sedation options and                         risks were discussed with the patient. All questions                         were answered and informed consent was obtained.                         Patient identification and proposed procedure were                         verified by the physician, the nurse, the                         anesthesiologist, the anesthetist and the technician                         in the pre-procedure area in the procedure room in the                         endoscopy suite. Mental Status Examination: alert and                         oriented. Airway Examination: normal oropharyngeal  airway and neck mobility. Respiratory Examination:                         clear to auscultation. CV Examination: normal.                         Prophylactic  Antibiotics: The patient does not require                         prophylactic antibiotics. Prior Anticoagulants: The                         patient has taken no anticoagulant or antiplatelet                         agents. ASA Grade Assessment: II - A patient with mild                         systemic disease. After reviewing the risks and                         benefits, the patient was deemed in satisfactory                         condition to undergo the procedure. The anesthesia                         plan was to use general anesthesia. Immediately prior                         to administration of medications, the patient was                         re-assessed for adequacy to receive sedatives. The                         heart rate, respiratory rate, oxygen saturations,                         blood pressure, adequacy of pulmonary ventilation, and                         response to care were monitored throughout the                         procedure. The physical status of the patient was                         re-assessed after the procedure.                        After obtaining informed consent, the colonoscope was                         passed under direct vision. Throughout the procedure,                         the patient's blood pressure, pulse, and oxygen  saturations were monitored continuously. The                         Colonoscope was introduced through the anus and                         advanced to the the cecum, identified by appendiceal                         orifice and ileocecal valve. The colonoscopy was                         performed with moderate difficulty due to significant                         looping and the patient's body habitus. Successful                         completion of the procedure was aided by applying                         abdominal pressure. The patient tolerated the                         procedure  well. The quality of the bowel preparation                         was evaluated using the BBPS [facility] Bowel Preparation                         Scale) with scores of: Right Colon = 3, Transverse                         Colon = 3 and Left Colon = 3 (entire mucosa seen well                         with no residual staining, small fragments of stool or                         opaque liquid). The total BBPS score equals 9. The                         ileocecal valve, appendiceal orifice, and rectum were                         photographed. Findings:      The perianal and digital rectal examinations were normal. Pertinent       negatives include normal sphincter tone and no palpable rectal lesions.      The entire examined colon appeared normal.      The retroflexed view of the distal rectum and anal verge was normal and       showed no anal or rectal abnormalities. Impression:            - The entire examined colon is normal.                        - The distal rectum and anal verge  are normal on                         retroflexion view.                        - No specimens collected. Recommendation:        - Discharge patient to home (with escort).                        - Resume previous diet today.                        - Continue present medications.                        - Repeat colonoscopy in 10 years for screening                         purposes. Procedure Code(s):     --- Professional ---                        H9878, Colorectal cancer screening; colonoscopy on                         individual not meeting criteria for high risk Diagnosis Code(s):     --- Professional ---                        Z12.11, Encounter for screening for malignant neoplasm                         of colon CPT copyright 2022 American Medical Association. All rights reserved. The codes documented in this report are preliminary and upon coder review may  be revised to meet current compliance  requirements. Dr. Corinn Brooklyn Corinn Jess Brooklyn MD, MD 04/23/2024 12:18:19 PM This report has been signed electronically. Number of Addenda: 0 Note Initiated On: 04/23/2024 11:49 AM Scope Withdrawal Time: 0 hours 8 minutes 32 seconds  Total Procedure Duration: 0 hours 16 minutes 24 seconds  Estimated Blood Loss:  Estimated blood loss: none.      [facility]
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
## 2024-04-24 NOTE — Anesthesia Postprocedure Evaluation (Signed)
 Anesthesia Post Note  Patient: Brittney Johns  Procedure(s) Performed: COLONOSCOPY  Patient location during evaluation: Endoscopy Anesthesia Type: General Level of consciousness: awake and alert Pain management: pain level controlled Vital Signs Assessment: post-procedure vital signs reviewed and stable Respiratory status: spontaneous breathing, nonlabored ventilation, respiratory function stable and patient connected to nasal cannula oxygen Cardiovascular status: blood pressure returned to baseline and stable Postop Assessment: no apparent nausea or vomiting Anesthetic complications: no   No notable events documented.   Last Vitals:  Vitals:   04/23/24 1230 04/23/24 1235  BP: (!) 97/57 104/63  Pulse: (!) 50   Resp: 12 13  Temp:  (!) 35.6 C  SpO2: (!) 81% 100%    Last Pain:  Vitals:   04/23/24 1235  TempSrc:   PainSc: 0-No pain                 Debby Mines

## 2024-04-28 ENCOUNTER — Encounter: Payer: Self-pay | Admitting: Family Medicine

## 2024-04-28 ENCOUNTER — Ambulatory Visit: Payer: 59 | Admitting: Family Medicine

## 2024-04-28 VITALS — BP 115/78 | HR 68 | Ht 67.0 in | Wt 212.5 lb

## 2024-04-28 DIAGNOSIS — G43109 Migraine with aura, not intractable, without status migrainosus: Secondary | ICD-10-CM | POA: Diagnosis not present

## 2024-04-28 DIAGNOSIS — R202 Paresthesia of skin: Secondary | ICD-10-CM

## 2024-04-28 DIAGNOSIS — R431 Parosmia: Secondary | ICD-10-CM

## 2024-04-28 MED ORDER — VERAPAMIL HCL 80 MG PO TABS: 80.0000 mg | ORAL_TABLET | Freq: Two times a day (BID) | ORAL | 3 refills | Status: AC

## 2024-04-28 MED ORDER — NURTEC 75 MG PO TBDP: 75.0000 mg | ORAL_TABLET | Freq: Every day | ORAL | 11 refills | Status: AC | PRN

## 2024-04-28 MED ORDER — TOPIRAMATE 100 MG PO TABS: 50.0000 mg | ORAL_TABLET | Freq: Two times a day (BID) | ORAL | 11 refills | Status: AC

## 2024-05-27 ENCOUNTER — Ambulatory Visit: Admitting: Obstetrics and Gynecology

## 2024-06-23 ENCOUNTER — Other Ambulatory Visit: Payer: Self-pay | Admitting: Nurse Practitioner

## 2024-06-23 ENCOUNTER — Encounter: Payer: Self-pay | Admitting: Obstetrics

## 2024-06-23 DIAGNOSIS — F331 Major depressive disorder, recurrent, moderate: Secondary | ICD-10-CM
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
## 2024-06-24 NOTE — Telephone Encounter (Signed)
 Requested Prescriptions  Pending Prescriptions Disp Refills   citalopram  (CELEXA ) 20 MG tablet [Pharmacy Med Name: CITALOPRAM  20MG  TABLETS] 30 tablet 0    Sig: TAKE 1 TABLET(20 MG) BY MOUTH DAILY     Psychiatry:  Antidepressants - SSRI Failed - 06/24/2024  3:31 PM      Failed - Valid encounter within last 6 months    Recent Outpatient Visits           3 months ago Bilateral hip pain   [facility] [place], Michelene, PA-C              Passed - Completed PHQ-2 or PHQ-9 in the last 360 days

## 2024-06-25 ENCOUNTER — Encounter: Admitting: Obstetrics

## 2024-06-25 DIAGNOSIS — Z01419 Encounter for gynecological examination (general) (routine) without abnormal findings: Secondary | ICD-10-CM

## 2024-06-25 DIAGNOSIS — Z1231 Encounter for screening mammogram for malignant neoplasm of breast: Secondary | ICD-10-CM

## 2024-07-24 ENCOUNTER — Encounter: Payer: Self-pay | Admitting: Obstetrics
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
## 2024-07-29 ENCOUNTER — Encounter: Payer: Self-pay | Admitting: Obstetrics

## 2024-07-29 ENCOUNTER — Ambulatory Visit: Admitting: Obstetrics

## 2024-07-29 VITALS — BP 128/85 | HR 88 | Wt 201.7 lb

## 2024-07-29 DIAGNOSIS — Z01419 Encounter for gynecological examination (general) (routine) without abnormal findings: Secondary | ICD-10-CM

## 2024-07-29 DIAGNOSIS — T385X5A Adverse effect of other estrogens and progestogens, initial encounter: Secondary | ICD-10-CM

## 2024-07-29 DIAGNOSIS — Z1231 Encounter for screening mammogram for malignant neoplasm of breast: Secondary | ICD-10-CM

## 2024-07-29 NOTE — Patient Instructions (Addendum)
 Dementia Prevention -Decrease alcohol intake as much as possible, or eliminate -Take 10-15g of Creatine Monohydrate daily -Fish oil supplement daily -8-10K steps a day -Brain stimulation  Bone strength -Start women's multivitamin daily -Calcium 1500mg , Vit D 09-1998 units daily -Weight-bearing exercise -Start strength training

## 2024-08-05 ENCOUNTER — Encounter: Payer: Self-pay | Admitting: Nurse Practitioner

## 2024-08-05 ENCOUNTER — Other Ambulatory Visit: Payer: Self-pay | Admitting: Nurse Practitioner

## 2024-08-05 DIAGNOSIS — F331 Major depressive disorder, recurrent, moderate: Secondary | ICD-10-CM

## 2024-08-07 NOTE — Telephone Encounter (Signed)
 Requested medications are due for refill today.  yes  Requested medications are on the active medications list.  yes  Last refill. 06/24/2024 #30 0 rf  Future visit scheduled.   no  Notes to clinic.  Pt already given a courtesy refill    Requested Prescriptions  Pending Prescriptions Disp Refills   citalopram  (CELEXA ) 20 MG tablet [Pharmacy Med Name: CITALOPRAM  20MG  TABLETS] 30 tablet 0    Sig: TAKE 1 TABLET(20 MG) BY MOUTH DAILY     Psychiatry:  Antidepressants - SSRI Failed - 08/07/2024  5:47 PM      Failed - Valid encounter within last 6 months    Recent Outpatient Visits           5 months ago Bilateral hip pain   [facility] [place], Michelene, PA-C              Passed - Completed PHQ-2 or PHQ-9 in the last 360 days

## 2024-08-10 ENCOUNTER — Other Ambulatory Visit: Payer: Self-pay | Admitting: Medical Genetics

## 2024-08-13 ENCOUNTER — Telehealth: Payer: Self-pay

## 2024-08-13 ENCOUNTER — Other Ambulatory Visit: Payer: Self-pay

## 2024-08-13 DIAGNOSIS — T385X5A Adverse effect of other estrogens and progestogens, initial encounter: Secondary | ICD-10-CM

## 2024-08-13 DIAGNOSIS — E8940 Asymptomatic postprocedural ovarian failure: Secondary | ICD-10-CM

## 2024-08-13 MED ORDER — VEOZAH 45 MG PO TABS: 45.0000 mg | ORAL_TABLET | Freq: Every day | ORAL | 11 refills | Status: AC

## 2024-08-13 NOTE — Progress Notes (Signed)
 Rx for Veozah  sent to patient pharmacy per annual exam note 07/29/24.

## 2024-08-13 NOTE — Telephone Encounter (Signed)
 Prosperity called triage stating she saw Dr Leigh on 07/29/24 and she was given samples of Veozah , and an back up (didn't know the other kind) she states her pharmacy has nothing from us , I also see in the chart under medication where nothing was sent. Please advise.

## 2024-08-13 NOTE — Telephone Encounter (Signed)
 Rx for Veozah  submitted per Dr. Marchia note.  Unable to reach pt by phone; no voice mail set up.  MyChart message sent.

## 2024-08-27 ENCOUNTER — Telehealth: Admitting: Family Medicine

## 2024-08-27 DIAGNOSIS — B9689 Other specified bacterial agents as the cause of diseases classified elsewhere: Secondary | ICD-10-CM

## 2024-08-27 MED ORDER — AMOXICILLIN-POT CLAVULANATE 875-125 MG PO TABS: 1.0000 | ORAL_TABLET | Freq: Two times a day (BID) | ORAL | 0 refills | Status: AC

## 2024-08-27 MED ORDER — IPRATROPIUM BROMIDE 0.03 % NA SOLN: 2.0000 | Freq: Two times a day (BID) | NASAL | 0 refills | Status: AC

## 2024-08-27 NOTE — Progress Notes (Signed)
 E-Visit for Sinus Problems  We are sorry that you are not feeling well.  Here is how we plan to help!  Based on what you have shared with me it looks like you have sinusitis.  Sinusitis is inflammation and infection in the sinus cavities of the head.  Based on your presentation I believe you most likely have Acute Bacterial Sinusitis.  This is an infection caused by bacteria and is treated with antibiotics. I have prescribed Augmentin 875mg /125mg  one tablet twice daily with food, for 7 days. and I have also prescribed Ipratropium Bromide Nasal Spray Use 1 spray in each nostril twice daily as needed for drainage; discontinue if too drying You may use an oral decongestant such as Mucinex  D or if you have glaucoma or high blood pressure use plain Mucinex . Saline nasal spray help and can safely be used as often as needed for congestion.  If you develop worsening sinus pain, fever or notice severe headache and vision changes, or if symptoms are not better after completion of antibiotic, please schedule an appointment with a health care provider.    Sinus infections are not as easily transmitted as other respiratory infection, however we still recommend that you avoid close contact with loved ones, especially the very young and elderly.  Remember to wash your hands thoroughly throughout the day as this is the number one way to prevent the spread of infection!  Home Care: Only take medications as instructed by your medical team. Complete the entire course of an antibiotic. Do not take these medications with alcohol. A steam or ultrasonic humidifier can help congestion.  You can place a towel over your head and breathe in the steam from hot water coming from a faucet. Avoid close contacts especially the very young and the elderly. Cover your mouth when you cough or sneeze. Always remember to wash your hands.  Get Help Right Away If: You develop worsening fever or sinus pain. You develop a severe head  ache or visual changes. Your symptoms persist after you have completed your treatment plan.  Make sure you Understand these instructions. Will watch your condition. Will get help right away if you are not doing well or get worse.  Your e-visit answers were reviewed by a board certified advanced clinical practitioner to complete your personal care plan.  Depending on the condition, your plan could have included both over the counter or prescription medications.  If there is a problem please reply  once you have received a response from your provider.  Your safety is important to us .  If you have drug allergies check your prescription carefully.    You can use MyChart to ask questions about today's visit, request a non-urgent call back, or ask for a work or school excuse for 24 hours related to this e-Visit. If it has been greater than 24 hours you will need to follow up with your provider, or enter a new e-Visit to address those concerns.  You will get an e-mail in the next two days asking about your experience.  I hope that your e-visit has been valuable and will speed your recovery. Thank you for using e-visits.  I have spent 5 minutes in review of e-visit questionnaire, review and updating patient chart, medical decision making and response to patient.   Chiquita CHRISTELLA Barefoot, NP

## 2024-09-04 ENCOUNTER — Telehealth: Payer: Self-pay

## 2024-09-04 ENCOUNTER — Encounter: Payer: Self-pay | Admitting: Obstetrics

## 2024-09-04 NOTE — Telephone Encounter (Signed)
PA submitted, waiting for response.

## 2024-09-04 NOTE — Telephone Encounter (Signed)
 SABRA

## 2024-09-08 NOTE — Telephone Encounter (Signed)
 SABRA

## 2024-09-09 NOTE — Telephone Encounter (Signed)
 Dr. Leigh, PA was denied.

## 2024-09-09 NOTE — Telephone Encounter (Signed)
 PA denied. Msg routed to Dr. Leigh (separate encounter).

## 2024-09-17 ENCOUNTER — Encounter: Payer: Self-pay | Admitting: Obstetrics

## 2024-09-22 NOTE — Telephone Encounter (Signed)
 Appeal has been filed.  Awaiting determination.

## 2024-09-30 ENCOUNTER — Other Ambulatory Visit

## 2024-10-30 ENCOUNTER — Other Ambulatory Visit

## 2024-11-17 ENCOUNTER — Ambulatory Visit

## 2025-04-28 ENCOUNTER — Ambulatory Visit: Admitting: Family Medicine
